# Patient Record
Sex: Male | Born: 1985 | Race: Black or African American | Hispanic: No | Marital: Married | Smoking: Former smoker
Health system: Southern US, Community
[De-identification: ages and names within clinical notes are randomized; demographics above are authoritative.]

## PROBLEM LIST (undated history)

## (undated) DIAGNOSIS — C801 Malignant (primary) neoplasm, unspecified: Secondary | ICD-10-CM

## (undated) DIAGNOSIS — J40 Bronchitis, not specified as acute or chronic: Secondary | ICD-10-CM

## (undated) DIAGNOSIS — J45909 Unspecified asthma, uncomplicated: Secondary | ICD-10-CM

## (undated) DIAGNOSIS — R569 Unspecified convulsions: Secondary | ICD-10-CM

## (undated) DIAGNOSIS — G40909 Epilepsy, unspecified, not intractable, without status epilepticus: Secondary | ICD-10-CM

## (undated) DIAGNOSIS — F1020 Alcohol dependence, uncomplicated: Secondary | ICD-10-CM

## (undated) HISTORY — DX: Alcohol dependence, uncomplicated: F10.20

## (undated) HISTORY — PX: OTHER SURGICAL HISTORY: SHX169

## (undated) HISTORY — DX: Malignant (primary) neoplasm, unspecified: C80.1

---
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
## 2008-05-08 ENCOUNTER — Emergency Department: Payer: Self-pay

## 2008-07-04 ENCOUNTER — Emergency Department: Payer: Self-pay | Admitting: Emergency Medicine

## 2008-07-05 ENCOUNTER — Emergency Department: Payer: Self-pay | Admitting: Internal Medicine

## 2008-12-19 ENCOUNTER — Emergency Department: Payer: Self-pay | Admitting: Emergency Medicine

## 2008-12-21 ENCOUNTER — Emergency Department: Payer: Self-pay | Admitting: Unknown Physician Specialty

## 2009-07-09 ENCOUNTER — Emergency Department: Payer: Self-pay | Admitting: Emergency Medicine

## 2010-08-02 ENCOUNTER — Emergency Department: Payer: Self-pay | Admitting: Emergency Medicine

## 2010-10-09 ENCOUNTER — Emergency Department: Payer: Self-pay | Admitting: Emergency Medicine

## 2011-02-16 ENCOUNTER — Emergency Department: Payer: Self-pay | Admitting: Emergency Medicine

## 2011-02-20 ENCOUNTER — Emergency Department: Payer: Self-pay | Admitting: Emergency Medicine

## 2013-01-16 ENCOUNTER — Emergency Department: Payer: Self-pay | Admitting: Emergency Medicine

## 2013-03-15 ENCOUNTER — Emergency Department: Payer: Self-pay | Admitting: Emergency Medicine

## 2013-07-18 ENCOUNTER — Emergency Department: Payer: Self-pay | Admitting: Internal Medicine

## 2013-07-18 LAB — BASIC METABOLIC PANEL
ANION GAP: 3 — AB (ref 7–16)
BUN: 14 mg/dL (ref 7–18)
CALCIUM: 9.4 mg/dL (ref 8.5–10.1)
CREATININE: 1.15 mg/dL (ref 0.60–1.30)
Chloride: 106 mmol/L (ref 98–107)
Co2: 30 mmol/L (ref 21–32)
GLUCOSE: 72 mg/dL (ref 65–99)
OSMOLALITY: 277 (ref 275–301)
Potassium: 4.1 mmol/L (ref 3.5–5.1)
SODIUM: 139 mmol/L (ref 136–145)

## 2013-07-18 LAB — CBC
HCT: 53.6 % — ABNORMAL HIGH (ref 40.0–52.0)
HGB: 18.1 g/dL — AB (ref 13.0–18.0)
MCH: 30.4 pg (ref 26.0–34.0)
MCHC: 33.8 g/dL (ref 32.0–36.0)
MCV: 90 fL (ref 80–100)
Platelet: 237 10*3/uL (ref 150–440)
RBC: 5.96 10*6/uL — ABNORMAL HIGH (ref 4.40–5.90)
RDW: 14.3 % (ref 11.5–14.5)
WBC: 9.5 10*3/uL (ref 3.8–10.6)

## 2013-07-20 ENCOUNTER — Emergency Department: Payer: Self-pay | Admitting: Emergency Medicine

## 2013-07-20 LAB — CBC
HCT: 54.6 % — AB (ref 40.0–52.0)
HGB: 18.3 g/dL — ABNORMAL HIGH (ref 13.0–18.0)
MCH: 29.9 pg (ref 26.0–34.0)
MCHC: 33.5 g/dL (ref 32.0–36.0)
MCV: 89 fL (ref 80–100)
Platelet: 255 10*3/uL (ref 150–440)
RBC: 6.12 10*6/uL — AB (ref 4.40–5.90)
RDW: 14.1 % (ref 11.5–14.5)
WBC: 8 10*3/uL (ref 3.8–10.6)

## 2013-07-20 LAB — BASIC METABOLIC PANEL
Anion Gap: 4 — ABNORMAL LOW (ref 7–16)
BUN: 13 mg/dL (ref 7–18)
CREATININE: 1.24 mg/dL (ref 0.60–1.30)
Calcium, Total: 9.8 mg/dL (ref 8.5–10.1)
Chloride: 104 mmol/L (ref 98–107)
Co2: 28 mmol/L (ref 21–32)
EGFR (Non-African Amer.): >60
GLUCOSE: 89 mg/dL (ref 65–99)
Osmolality: 272 (ref 275–301)
Potassium: 4 mmol/L (ref 3.5–5.1)
SODIUM: 136 mmol/L (ref 136–145)

## 2013-08-04 DIAGNOSIS — Z720 Tobacco use: Secondary | ICD-10-CM | POA: Diagnosis present

## 2013-10-26 ENCOUNTER — Emergency Department: Payer: Self-pay | Admitting: Emergency Medicine

## 2013-10-26 LAB — CBC WITH DIFFERENTIAL/PLATELET
BASOS ABS: 0.1 10*3/uL (ref 0.0–0.1)
BASOS PCT: 0.5 %
Eosinophil #: 0.2 10*3/uL (ref 0.0–0.7)
Eosinophil %: 2.1 %
HCT: 50 % (ref 40.0–52.0)
HGB: 16.3 g/dL (ref 13.0–18.0)
LYMPHS PCT: 11.7 %
Lymphocyte #: 1.3 10*3/uL (ref 1.0–3.6)
MCH: 29.4 pg (ref 26.0–34.0)
MCHC: 32.6 g/dL (ref 32.0–36.0)
MCV: 90 fL (ref 80–100)
Monocyte #: 0.9 x10 3/mm (ref 0.2–1.0)
Monocyte %: 8.3 %
NEUTROS ABS: 8.5 10*3/uL — AB (ref 1.4–6.5)
Neutrophil %: 77.4 %
PLATELETS: 201 10*3/uL (ref 150–440)
RBC: 5.56 10*6/uL (ref 4.40–5.90)
RDW: 14.5 % (ref 11.5–14.5)
WBC: 11 10*3/uL — ABNORMAL HIGH (ref 3.8–10.6)

## 2013-10-26 LAB — COMPREHENSIVE METABOLIC PANEL
ALBUMIN: 4 g/dL (ref 3.4–5.0)
AST: 25 U/L (ref 15–37)
Alkaline Phosphatase: 75 U/L
Anion Gap: 4 — ABNORMAL LOW (ref 7–16)
BUN: 16 mg/dL (ref 7–18)
Bilirubin,Total: 0.4 mg/dL (ref 0.2–1.0)
CHLORIDE: 109 mmol/L — AB (ref 98–107)
Calcium, Total: 9.1 mg/dL (ref 8.5–10.1)
Co2: 27 mmol/L (ref 21–32)
Creatinine: 1.29 mg/dL (ref 0.60–1.30)
EGFR (African American): >60
Glucose: 95 mg/dL (ref 65–99)
Osmolality: 280 (ref 275–301)
Potassium: 4.1 mmol/L (ref 3.5–5.1)
SGPT (ALT): 27 U/L (ref 12–78)
Sodium: 140 mmol/L (ref 136–145)
TOTAL PROTEIN: 7.2 g/dL (ref 6.4–8.2)

## 2013-10-26 LAB — PHENYTOIN LEVEL, TOTAL: Dilantin: <0.4 ug/mL — ABNORMAL LOW (ref 10.0–20.0)

## 2014-01-23 ENCOUNTER — Emergency Department: Payer: Self-pay | Admitting: Student

## 2014-01-23 LAB — COMPREHENSIVE METABOLIC PANEL
ALK PHOS: 59 U/L
ALT: 26 U/L
ANION GAP: 7 (ref 7–16)
Albumin: 4.3 g/dL (ref 3.4–5.0)
BUN: 9 mg/dL (ref 7–18)
Bilirubin,Total: 0.3 mg/dL (ref 0.2–1.0)
CO2: 23 mmol/L (ref 21–32)
CREATININE: 1.11 mg/dL (ref 0.60–1.30)
Calcium, Total: 9.3 mg/dL (ref 8.5–10.1)
Chloride: 113 mmol/L — ABNORMAL HIGH (ref 98–107)
EGFR (African American): >60
Glucose: 80 mg/dL (ref 65–99)
Osmolality: 283 (ref 275–301)
POTASSIUM: 4 mmol/L (ref 3.5–5.1)
SGOT(AST): 16 U/L (ref 15–37)
Sodium: 143 mmol/L (ref 136–145)
TOTAL PROTEIN: 7.4 g/dL (ref 6.4–8.2)

## 2014-01-23 LAB — CBC
HCT: 51.3 % (ref 40.0–52.0)
HGB: 16.3 g/dL (ref 13.0–18.0)
MCH: 29.2 pg (ref 26.0–34.0)
MCHC: 31.8 g/dL — ABNORMAL LOW (ref 32.0–36.0)
MCV: 92 fL (ref 80–100)
Platelet: 252 10*3/uL (ref 150–440)
RBC: 5.6 10*6/uL (ref 4.40–5.90)
RDW: 15.2 % — AB (ref 11.5–14.5)
WBC: 8.1 10*3/uL (ref 3.8–10.6)

## 2014-01-23 LAB — CK: CK, Total: 298 U/L

## 2014-03-08 ENCOUNTER — Emergency Department: Payer: Self-pay | Admitting: Emergency Medicine

## 2014-10-03 ENCOUNTER — Emergency Department
Admit: 2014-10-03 | Disposition: A | Discharge status: Home / Self Care | Payer: Self-pay | Admitting: Emergency Medicine

## 2014-10-25 ENCOUNTER — Emergency Department
Admission: EM | Admit: 2014-10-25 | Discharge: 2014-10-25 | Disposition: A | Discharge status: Home / Self Care | Payer: Self-pay | Attending: Emergency Medicine | Admitting: Emergency Medicine

## 2014-10-25 DIAGNOSIS — R569 Unspecified convulsions: Secondary | ICD-10-CM

## 2014-10-25 DIAGNOSIS — G40909 Epilepsy, unspecified, not intractable, without status epilepticus: Secondary | ICD-10-CM | POA: Insufficient documentation

## 2014-10-25 DIAGNOSIS — Z880 Allergy status to penicillin: Secondary | ICD-10-CM | POA: Insufficient documentation

## 2014-10-25 MED ORDER — LEVETIRACETAM 500 MG PO TABS
1000.0000 mg | ORAL_TABLET | Freq: Once | ORAL | Status: AC
  Administered 2014-10-25: 1000 mg via ORAL

## 2014-10-25 MED ORDER — LEVETIRACETAM 500 MG PO TABS: 1000.0000 mg | ORAL_TABLET | Freq: Two times a day (BID) | ORAL | Status: DC

## 2014-10-25 MED ORDER — LEVETIRACETAM 500 MG PO TABS
ORAL_TABLET | ORAL | Status: AC
Start: 2014-10-25 — End: 2014-10-25
  Administered 2014-10-25: 1000 mg via ORAL
  Filled 2014-10-25: qty 2

## 2014-10-25 NOTE — ED Notes (Signed)
Has a hx of epilepsy had 1 seizure this am states feels weak and dizzy, also co nausea.  Pt is on keppra pt ran out yesterday.

## 2014-10-25 NOTE — Discharge Instructions (Signed)
Please seek medical attention for any high fevers, chest pain, shortness of breath, change in behavior, persistent vomiting, bloody stool or any other new or concerning symptoms. It is very important that you do not drive, go up on roofs, swim, or put herself in any situation that might be dangerous for your others if you're to have another seizure until you are cleared by a neurologist.  Epilepsy Epilepsy is a disorder in which a person has repeated seizures over time. A seizure is a release of abnormal electrical activity in the brain. Seizures can cause a change in attention, behavior, or the ability to remain awake and alert (altered mental status). Seizures often involve uncontrollable shaking (convulsions).  Most people with epilepsy lead normal lives. However, people with epilepsy are at an increased risk of falls, accidents, and injuries. Therefore, it is important to begin treatment right away. CAUSES  Epilepsy has many possible causes. Anything that disturbs the normal pattern of brain cell activity can lead to seizures. This may include:   Head injury.  Birth trauma.  High fever as a child.  Stroke.  Bleeding into or around the brain.  Certain drugs.  Prolonged low oxygen, such as what occurs after CPR efforts.  Abnormal brain development.  Certain illnesses, such as meningitis, encephalitis (brain infection), malaria, and other infections.  An imbalance of nerve signaling chemicals (neurotransmitters).  SIGNS AND SYMPTOMS  The symptoms of a seizure can vary greatly from one person to another. Right before a seizure, you may have a warning (aura) that a seizure is about to occur. An aura may include the following symptoms:  Fear or anxiety.  Nausea.  Feeling like the room is spinning (vertigo).  Vision changes, such as seeing flashing lights or spots. Common symptoms during a seizure include:  Abnormal sensations, such as an abnormal smell or a bitter taste in the  mouth.   Sudden, general body stiffness.   Convulsions that involve rhythmic jerking of the face, arm, or leg on one or both sides.   Sudden change in consciousness.   Appearing to be awake but not responding.   Appearing to be asleep but cannot be awakened.   Grimacing, chewing, lip smacking, drooling, tongue biting, or loss of bowel or bladder control. After a seizure, you may feel sleepy for a while. DIAGNOSIS  Your health care provider will ask about your symptoms and take a medical history. Descriptions from any witnesses to your seizures will be very helpful in the diagnosis. A physical exam, including a detailed neurological exam, is necessary. Various tests may be done, such as:   An electroencephalogram (EEG). This is a painless test of your brain waves. In this test, a diagram is created of your brain waves. These diagrams can be interpreted by a specialist.  An MRI of the brain.   A CT scan of the brain.   A spinal tap (lumbar puncture, LP).  Blood tests to check for signs of infection or abnormal blood chemistry. TREATMENT  There is no cure for epilepsy, but it is generally treatable. Once epilepsy is diagnosed, it is important to begin treatment as soon as possible. For most people with epilepsy, seizures can be controlled with medicines. The following may also be used:  A pacemaker for the brain (vagus nerve stimulator) can be used for people with seizures that are not well controlled by medicine.  Surgery on the brain. For some people, epilepsy eventually goes away. HOME CARE INSTRUCTIONS   Follow your health  care provider's recommendations on driving and safety in normal activities.  Get enough rest. Lack of sleep can cause seizures.  Only take over-the-counter or prescription medicines as directed by your health care provider. Take any prescribed medicine exactly as directed.  Avoid any known triggers of your seizures.  Keep a seizure diary. Record  what you recall about any seizure, especially any possible trigger.   Make sure the people you live and work with know that you are prone to seizures. They should receive instructions on how to help you. In general, a witness to a seizure should:   Cushion your head and body.   Turn you on your side.   Avoid unnecessarily restraining you.   Not place anything inside your mouth.   Call for emergency medical help if there is any question about what has occurred.   Follow up with your health care provider as directed. You may need regular blood tests to monitor the levels of your medicine.  SEEK MEDICAL CARE IF:   You develop signs of infection or other illness. This might increase the risk of a seizure.   You seem to be having more frequent seizures.   Your seizure pattern is changing.  SEEK IMMEDIATE MEDICAL CARE IF:   You have a seizure that does not stop after a few moments.   You have a seizure that causes any difficulty in breathing.   You have a seizure that results in a very severe headache.   You have a seizure that leaves you with the inability to speak or use a part of your body.  Document Released: 05/27/2005 Document Revised: 03/17/2013 Document Reviewed: 01/06/2013 [facility] Patient Information 2015 [place], [place]. This information is not intended to replace advice given to you by your health care provider. Make sure you discuss any questions you have with your health care provider.

## 2014-10-25 NOTE — ED Provider Notes (Signed)
[facility] Emergency Department Provider Note   ____________________________________________  Time seen: 2110  I have reviewed the triage vital signs and the nursing notes.   HISTORY  Chief Complaint Seizures   History limited by: Not Limited   HPI Shane Leach is a 29 y.o. male history of epilepsy presents to the emergency department today after a seizure. He shouldn't states that he ran out of his Keppra yesterday. He states that he is having seizures roughly once or twice a month. States after today's seizure he felt cramps and soreness throughout his body. He states this is normally how he feels after seizures. He denies any recent heavy alcohol use or sleep deprivation.    No past medical history on file.  There are no active problems to display for this patient.   No past surgical history on file.  Current Outpatient Rx  Name  Route  Sig  Dispense  Refill  . levETIRAcetam (KEPPRA) 500 MG tablet   Oral   Take 2 tablets (1,000 mg total) by mouth 2 (two) times daily.   60 tablet   0     Allergies Dilantin; Penicillins; and Tegretol  No family history on file.  Social History History  Substance Use Topics  . Smoking status: Not on file  . Smokeless tobacco: Not on file  . Alcohol Use: Not on file    Review of Systems  Constitutional: Negative for fever. Cardiovascular: Negative for chest pain. Respiratory: Negative for shortness of breath. Gastrointestinal: Negative for abdominal pain, vomiting and diarrhea. Genitourinary: Negative for dysuria. Musculoskeletal: Negative for back pain. Skin: Negative for rash. Neurological: positive for seizure   10-point ROS otherwise negative.  ____________________________________________   PHYSICAL EXAM:  VITAL SIGNS: ED Triage Vitals  Enc Vitals Group     BP 10/25/14 1943 125/76 mmHg     Pulse Rate 10/25/14 1943 74     Resp 10/25/14 1943 18     Temp 10/25/14 1943 98.8  F (37.1 C)     Temp Source 10/25/14 1943 Oral     SpO2 10/25/14 1943 100 %     Weight 10/25/14 1943 210 lb (95.255 kg)     Height 10/25/14 1943 6' (1.829 m)     Head Cir --      Peak Flow --      Pain Score 10/25/14 1943 10   Constitutional: Alert and oriented. Well appearing and in no distress. Eyes: Conjunctivae are normal. PERRL. Normal extraocular movements. ENT   Head: Normocephalic and atraumatic.   Nose: No congestion/rhinnorhea.   Mouth/Throat: Mucous membranes are moist.   Neck: No stridor. Hematological/Lymphatic/Immunilogical: No cervical lymphadenopathy. Cardiovascular: Normal rate, regular rhythm.  No murmurs, rubs, or gallops. Respiratory: Normal respiratory effort without tachypnea nor retractions. Breath sounds are clear and equal bilaterally. No wheezes/rales/rhonchi. Genitourinary: Deferred Musculoskeletal: Normal range of motion in all extremities. No joint effusions.  No lower extremity tenderness nor edema. Neurologic:  Normal speech and language. No gross focal neurologic deficits are appreciated. Speech is normal.  Skin:  Skin is warm, dry and intact. No rash noted. Psychiatric: Mood and affect are normal. Speech and behavior are normal. Patient exhibits appropriate insight and judgment.  ____________________________________________    LABS (pertinent positives/negatives)  None  ____________________________________________   EKG  None  ____________________________________________    RADIOLOGY  None  ____________________________________________   PROCEDURES  Procedure(s) performed: None  Critical Care performed: No  ____________________________________________   INITIAL IMPRESSION / ASSESSMENT AND PLAN / ED  COURSE  Pertinent labs & imaging results that were available during my care of the patient were reviewed by me and considered in my medical decision making (see chart for details).  Patient with a history of  epilepsy here after a seizure. Patient states he ran out of his medication yesterday. He is on Keppra. No concerning signs on physical exam. Patient states he feels like he normally does after a seizure. Plan to give patient prescription for Keppra. We will encourage neurology follow-up. He is followed at [facility]. Per care everywhere patient is on 500 mg Keppra 2 tabs orally twice a day ____________________________________________   FINAL CLINICAL IMPRESSION(S) / ED DIAGNOSES  Final diagnoses:  Seizure     Nance Pear, MD 10/25/14 2121

## 2014-12-31 ENCOUNTER — Other Ambulatory Visit: Payer: Self-pay

## 2014-12-31 ENCOUNTER — Emergency Department: Payer: Self-pay

## 2014-12-31 ENCOUNTER — Encounter: Payer: Self-pay | Admitting: Emergency Medicine

## 2014-12-31 ENCOUNTER — Emergency Department
Admission: EM | Admit: 2014-12-31 | Discharge: 2015-01-01 | Disposition: A | Discharge status: Home / Self Care | Payer: Self-pay | Attending: Emergency Medicine | Admitting: Emergency Medicine

## 2014-12-31 DIAGNOSIS — Z720 Tobacco use: Secondary | ICD-10-CM | POA: Insufficient documentation

## 2014-12-31 DIAGNOSIS — J45901 Unspecified asthma with (acute) exacerbation: Secondary | ICD-10-CM | POA: Insufficient documentation

## 2014-12-31 DIAGNOSIS — Z880 Allergy status to penicillin: Secondary | ICD-10-CM | POA: Insufficient documentation

## 2014-12-31 DIAGNOSIS — F419 Anxiety disorder, unspecified: Secondary | ICD-10-CM | POA: Insufficient documentation

## 2014-12-31 DIAGNOSIS — Z79899 Other long term (current) drug therapy: Secondary | ICD-10-CM | POA: Insufficient documentation

## 2014-12-31 HISTORY — DX: Unspecified convulsions: R56.9

## 2014-12-31 HISTORY — DX: Unspecified asthma, uncomplicated: J45.909

## 2014-12-31 HISTORY — DX: Bronchitis, not specified as acute or chronic: J40

## 2014-12-31 LAB — COMPREHENSIVE METABOLIC PANEL
ALBUMIN: 4.8 g/dL (ref 3.5–5.0)
ALT: 22 U/L (ref 17–63)
AST: 23 U/L (ref 15–41)
Alkaline Phosphatase: 67 U/L (ref 38–126)
Anion gap: 12 (ref 5–15)
BILIRUBIN TOTAL: 0.8 mg/dL (ref 0.3–1.2)
BUN: 12 mg/dL (ref 6–20)
CALCIUM: 9.7 mg/dL (ref 8.9–10.3)
CHLORIDE: 109 mmol/L (ref 101–111)
CO2: 19 mmol/L — ABNORMAL LOW (ref 22–32)
Creatinine, Ser: 1.14 mg/dL (ref 0.61–1.24)
GFR calc Af Amer: >60 mL/min (ref >60)
GFR calc non Af Amer: >60 mL/min (ref >60)
Glucose, Bld: 77 mg/dL (ref 65–99)
Potassium: 4 mmol/L (ref 3.5–5.1)
Sodium: 140 mmol/L (ref 135–145)
TOTAL PROTEIN: 7.9 g/dL (ref 6.5–8.1)

## 2014-12-31 LAB — CBC
HCT: 50.9 % (ref 40.0–52.0)
Hemoglobin: 17.2 g/dL (ref 13.0–18.0)
MCH: 29.9 pg (ref 26.0–34.0)
MCHC: 33.7 g/dL (ref 32.0–36.0)
MCV: 88.5 fL (ref 80.0–100.0)
PLATELETS: 197 10*3/uL (ref 150–440)
RBC: 5.75 MIL/uL (ref 4.40–5.90)
RDW: 14.9 % — AB (ref 11.5–14.5)
WBC: 12.4 10*3/uL — AB (ref 3.8–10.6)

## 2014-12-31 MED ORDER — ALBUTEROL SULFATE (2.5 MG/3ML) 0.083% IN NEBU
2.5000 mg | INHALATION_SOLUTION | Freq: Once | RESPIRATORY_TRACT | Status: AC
  Administered 2014-12-31: 2.5 mg via RESPIRATORY_TRACT

## 2014-12-31 MED ORDER — ALBUTEROL SULFATE (2.5 MG/3ML) 0.083% IN NEBU
INHALATION_SOLUTION | RESPIRATORY_TRACT | Status: AC
  Filled 2014-12-31: qty 3

## 2014-12-31 MED ORDER — IPRATROPIUM-ALBUTEROL 0.5-2.5 (3) MG/3ML IN SOLN
3.0000 mL | Freq: Once | RESPIRATORY_TRACT | Status: AC
  Administered 2014-12-31: 3 mL via RESPIRATORY_TRACT

## 2014-12-31 MED ORDER — ALBUTEROL SULFATE (2.5 MG/3ML) 0.083% IN NEBU
INHALATION_SOLUTION | RESPIRATORY_TRACT | Status: AC
  Filled 2014-12-31: qty 6

## 2014-12-31 MED ORDER — IPRATROPIUM-ALBUTEROL 0.5-2.5 (3) MG/3ML IN SOLN
RESPIRATORY_TRACT | Status: AC
Start: 2014-12-31 — End: 2014-12-31
  Administered 2014-12-31: 3 mL
  Filled 2014-12-31: qty 3

## 2014-12-31 MED ORDER — METHYLPREDNISOLONE SODIUM SUCC 125 MG IJ SOLR
INTRAMUSCULAR | Status: AC
  Administered 2014-12-31: 125 mg via INTRAVENOUS
  Filled 2014-12-31: qty 2

## 2014-12-31 MED ORDER — METHYLPREDNISOLONE SODIUM SUCC 125 MG IJ SOLR
125.0000 mg | Freq: Once | INTRAMUSCULAR | Status: AC
  Administered 2014-12-31: 125 mg via INTRAVENOUS

## 2014-12-31 NOTE — ED Provider Notes (Signed)
[facility] Emergency Department Provider Note  ____________________________________________  Time seen: On arrival  I have reviewed the triage vital signs and the nursing notes.   HISTORY  Chief Complaint Shortness of Breath and Hyperventilating  History of present illness limited by patient distress  HPI Shane Leach is a 29 y.o. male who presents with shortness of breath. He reports a history of asthma in the past.He admits smoking. He denies cough or fever.     No past medical history on file.  There are no active problems to display for this patient.   No past surgical history on file.  Current Outpatient Rx  Name  Route  Sig  Dispense  Refill  . levETIRAcetam (KEPPRA) 500 MG tablet   Oral   Take 2 tablets (1,000 mg total) by mouth 2 (two) times daily.   60 tablet   0     Allergies Dilantin; Penicillins; and Tegretol  No family history on file.  Social History History  Substance Use Topics  . Smoking status: Not on file  . Smokeless tobacco: Not on file  . Alcohol Use: Not on file   positive smoker, no recent alcohol  Review of Systems  Constitutional: Negative for fever. Eyes: Negative for visual changes. ENT: Negative for sore throat Cardiovascular: Negative for chest pain. Respiratory: Positive for shortness of breath Gastrointestinal: Negative for abdominal pain, vomiting and diarrhea. Genitourinary: Negative for dysuria. Musculoskeletal: Negative for back pain. Skin: Negative for rash. Neurological: Negative for headaches or focal weakness Psychiatric: Positive for anxiety  10-point ROS otherwise negative.  ____________________________________________   PHYSICAL EXAM:  VITAL SIGNS: ED Triage Vitals  Enc Vitals Group     BP 12/31/14 2222 122/88 mmHg     Pulse Rate 12/31/14 2222 109     Resp 12/31/14 2222 40     Temp 12/31/14 2222 97.8 F (36.6 C)     Temp Source 12/31/14 2222 Oral     SpO2 12/31/14  2222 94 %     Weight 12/31/14 2222 200 lb (90.719 kg)     Height 12/31/14 2222 6' (1.829 m)     Head Cir --      Peak Flow --      Pain Score --      Pain Loc --      Pain Edu? --      Excl. in Pink? --      Constitutional: Alert and oriented. Patient diaphoretic and ill-appearing Eyes: Conjunctivae are normal.  ENT   Head: Normocephalic and atraumatic.   Mouth/Throat: Mucous membranes are moist. Cardiovascular: Tachycardia regular rhythm. Normal and symmetric distal pulses are present in all extremities. No murmurs, rubs, or gallops. Respiratory: Positive tachypnea and accessory muscle use. Wheezes diffusely Gastrointestinal: Soft and non-tender in all quadrants. No distention. There is no CVA tenderness. Genitourinary: deferred Musculoskeletal: Nontender with normal range of motion in all extremities. No lower extremity tenderness nor edema. Neurologic:  Normal speech and language. No gross focal neurologic deficits are appreciated. Skin:  Skin is diaphoretic Psychiatric: Mood and affect are normal. Patient exhibits appropriate insight and judgment.  ____________________________________________    LABS (pertinent positives/negatives)  Labs Reviewed  CBC  COMPREHENSIVE METABOLIC PANEL    ____________________________________________   EKG  ED ECG REPORT I, Lavonia Drafts, the attending physician, personally viewed and interpreted this ECG.   Date: 12/31/2014  EKG Time: 10:47 PM  Rate: 95  Rhythm: normal sinus rhythm  Axis: Left Axis deviation  Intervals:Incomplete right bundle  branch block  ST&T Change: Nonspecific   ____________________________________________    RADIOLOGY I have personally reviewed any xrays that were ordered on this patient: pending  ____________________________________________   PROCEDURES  Procedure(s) performed: none  Critical Care performed: none  ____________________________________________   INITIAL IMPRESSION /  ASSESSMENT AND PLAN / ED COURSE  Pertinent labs & imaging results that were available during my care of the patient were reviewed by me and considered in my medical decision making (see chart for details).  Patient seen immediately. DuoNeb and IV slight Medrol ordered. We will check chest x-ray labs and reevaluate closely  ----------------------------------------- 11:08 PM on 12/31/2014 -----------------------------------------  Patient's heart rate is improving, tachypnea is improving. I will sign out to Dr. Kerman Passey who will reevaluate the patient ____________________________________________   FINAL CLINICAL IMPRESSION(S) / ED DIAGNOSES  Asthma exacerbation, acute   Lavonia Drafts, MD 12/31/14 2309

## 2014-12-31 NOTE — ED Notes (Signed)
Pt c/o shortness of breath since Friday morning; presents tonight with tachypnea, diaphoresis, unable to speak in complete sentences, audible wheezing, pale skin; pt admits to smoking 1/2 pack per day with history of asthma; does not have a rescue inhaler

## 2015-01-01 LAB — TROPONIN I: Troponin I: <0.03 ng/mL (ref <0.031)

## 2015-01-01 MED ORDER — ALBUTEROL SULFATE HFA 108 (90 BASE) MCG/ACT IN AERS: 2.0000 | INHALATION_SPRAY | RESPIRATORY_TRACT | Status: DC | PRN

## 2015-01-01 MED ORDER — PREDNISONE 20 MG PO TABS: 40.0000 mg | ORAL_TABLET | Freq: Every day | ORAL | Status: DC

## 2015-01-01 MED ORDER — IPRATROPIUM-ALBUTEROL 0.5-2.5 (3) MG/3ML IN SOLN
3.0000 mL | Freq: Once | RESPIRATORY_TRACT | Status: AC
  Administered 2015-01-01: 3 mL via RESPIRATORY_TRACT
  Filled 2015-01-01: qty 3

## 2015-01-01 MED ORDER — AZITHROMYCIN 250 MG PO TABS: ORAL_TABLET | ORAL | Status: AC

## 2015-01-01 NOTE — ED Provider Notes (Signed)
-----------------------------------------   12:20 AM on 01/01/2015 -----------------------------------------  Patient appears to be moving a good amount of air, but has diffuse wheeze bilaterally still. Patient has received 1 DuoNeb, and 2 albuterol nebulizers. We will continue with 2 more DuoNeb nebulizers. Patient received 125 mg of Solu-Medrol. Chest x-ray was clear, labs are largely within normal limits. We will add on a troponin. Patient denies any chest pain or drug use. He has been coughing, states a history of asthma, states he is supposed be on medications which he does not take. Patient with a likely asthma exacerbation, possibly exacerbated by acute bronchitis. After nebulizer treatments we will take the patient off oxygen and monitor his saturation status.  ----------------------------------------- 1:29 AM on 01/01/2015 -----------------------------------------  Patient sounds much better at this time. Minimal wheeze on exam. We will place the patient on steroid's, albuterol, and Z-Pak to cover for any possible bronchitis going home. Patient to follow-up with a primary care physician this week. I discussed very strict return precautions for any further trouble breathing, the patient is agreeable to return if he has any trouble breathing.  Harvest Dark, MD 01/01/15 [phone]

## 2015-01-01 NOTE — Discharge Instructions (Signed)

## 2015-03-14 ENCOUNTER — Emergency Department: Payer: Self-pay

## 2015-03-14 ENCOUNTER — Encounter: Payer: Self-pay | Admitting: Emergency Medicine

## 2015-03-14 ENCOUNTER — Emergency Department
Admission: EM | Admit: 2015-03-14 | Discharge: 2015-03-14 | Disposition: A | Discharge status: Home / Self Care | Payer: Self-pay | Attending: Student | Admitting: Student

## 2015-03-14 DIAGNOSIS — Z87891 Personal history of nicotine dependence: Secondary | ICD-10-CM | POA: Insufficient documentation

## 2015-03-14 DIAGNOSIS — Z880 Allergy status to penicillin: Secondary | ICD-10-CM | POA: Insufficient documentation

## 2015-03-14 DIAGNOSIS — J4531 Mild persistent asthma with (acute) exacerbation: Secondary | ICD-10-CM | POA: Insufficient documentation

## 2015-03-14 DIAGNOSIS — Z79899 Other long term (current) drug therapy: Secondary | ICD-10-CM | POA: Insufficient documentation

## 2015-03-14 HISTORY — DX: Epilepsy, unspecified, not intractable, without status epilepticus: G40.909

## 2015-03-14 MED ORDER — PREDNISONE 10 MG PO TABS: ORAL_TABLET | ORAL | Status: DC

## 2015-03-14 MED ORDER — AZITHROMYCIN 250 MG PO TABS: ORAL_TABLET | ORAL | Status: AC

## 2015-03-14 MED ORDER — IPRATROPIUM-ALBUTEROL 0.5-2.5 (3) MG/3ML IN SOLN
3.0000 mL | Freq: Once | RESPIRATORY_TRACT | Status: AC
  Administered 2015-03-14: 3 mL via RESPIRATORY_TRACT
  Filled 2015-03-14: qty 3

## 2015-03-14 MED ORDER — PREDNISONE 20 MG PO TABS
60.0000 mg | ORAL_TABLET | Freq: Once | ORAL | Status: AC
  Administered 2015-03-14: 60 mg via ORAL
  Filled 2015-03-14: qty 3

## 2015-03-14 MED ORDER — ALBUTEROL SULFATE HFA 108 (90 BASE) MCG/ACT IN AERS: 2.0000 | INHALATION_SPRAY | Freq: Four times a day (QID) | RESPIRATORY_TRACT | Status: DC | PRN

## 2015-03-14 NOTE — ED Notes (Signed)
Patient presents to the ED with increased cough and congestion x 2 days.  Patient reports a history of asthma and bronchitis and states he often has exacerbations of his bronchitis.  Patient is in no obvious distress, ambulatory to triage, denies fever.  Patient reports body aches x 2 days as well.  Wheezing auscultated bilaterally.

## 2015-03-14 NOTE — ED Provider Notes (Signed)
CSN: [id number]     Arrival date & time 03/14/15  1604 History   First MD Initiated Contact with Patient 03/14/15 1659     Chief Complaint  Patient presents with  . Cough  . Nasal Congestion     HPI Comments: 29 year old male presents today complaining of productive cough and difficulty breathing for the past 3 days. He has a history of asthmatic bronchitis. He is a former smoker. He has been out of his albuterol inhaler. He has had some subjective fevers. He has not taken any over-the-counter cold medications.  Patient is a 29 y.o. male presenting with cough.  Cough Associated symptoms: fever and shortness of breath   Associated symptoms: no chills, no myalgias and no rash     Past Medical History  Diagnosis Date  . Seizures ([place])   . Asthma   . Bronchitis   . Epilepsy [facility])    Past Surgical History  Procedure Laterality Date  . Testicular cancer      right testicle removed   No family history on file. Social History  Substance Use Topics  . Smoking status: Former Smoker -- 0.50 packs/day    Types: Cigarettes    Quit date: 01/12/2015  . Smokeless tobacco: Never Used  . Alcohol Use: No    Review of Systems  Constitutional: Positive for fever. Negative for chills.  Respiratory: Positive for cough and shortness of breath.   Musculoskeletal: Negative for myalgias and arthralgias.  Skin: Negative for rash.  All other systems reviewed and are negative.     Allergies  Dilantin; Penicillins; and Tegretol  Home Medications   Prior to Admission medications   Medication Sig Start Date End Date Taking? Authorizing Provider  levETIRAcetam (KEPPRA) 500 MG tablet Take 2 tablets (1,000 mg total) by mouth 2 (two) times daily. 10/25/14  Yes Nance Pear, MD  albuterol (PROVENTIL HFA;VENTOLIN HFA) 108 (90 BASE) MCG/ACT inhaler Inhale 2 puffs into the lungs every 6 (six) hours as needed for wheezing or shortness of breath. 03/14/15   Corliss Parish, PA-C  azithromycin  (ZITHROMAX Z-PAK) 250 MG tablet Take 2 tablets (500 mg) on  Day 1,  followed by 1 tablet (250 mg) once daily on Days 2 through 5. 03/14/15 03/19/15  Shayne Alken V, PA-C  predniSONE (DELTASONE) 10 MG tablet Taper: 6,5,4,3,2,1 03/14/15   Shayne Alken V, PA-C   BP 125/87 mmHg  Pulse 79  Temp(Src) 99.1 F (37.3 C) (Oral)  Resp 20  Ht 6' (1.829 m)  Wt 210 lb (95.255 kg)  BMI 28.47 kg/m2  SpO2 95% Physical Exam  Constitutional: He is oriented to person, place, and time. Vital signs are normal. He appears well-developed and well-nourished. He is active.  Non-toxic appearance. He does not have a sickly appearance. He does not appear ill.  HENT:  Head: Normocephalic and atraumatic.  Right Ear: Tympanic membrane and external ear normal.  Left Ear: Tympanic membrane and external ear normal.  Nose: Nose normal.  Mouth/Throat: Oropharynx is clear and moist.  Eyes: Conjunctivae and EOM are normal. Pupils are equal, round, and reactive to light.  Neck: Normal range of motion. Neck supple.  Cardiovascular: Normal rate, regular rhythm, normal heart sounds and intact distal pulses.  Exam reveals no gallop and no friction rub.   No murmur heard. Pulmonary/Chest: Effort normal. No respiratory distress. He has wheezes. He has rhonchi. He has no rales.  Musculoskeletal: Normal range of motion.  Lymphadenopathy:    He has no cervical adenopathy.  Neurological: He is alert and oriented to person, place, and time.  Skin: Skin is warm and dry. No rash noted.  Psychiatric: He has a normal mood and affect. His behavior is normal. Judgment and thought content normal.  Nursing note and vitals reviewed.   ED Course  Procedures (including critical care time) Labs Review Labs Reviewed - No data to display  Imaging Review Dg Chest 2 View  03/14/2015   CLINICAL DATA:  Patient presents to the ED with increased cough and congestion x 2 days. Patient reports a history of asthma and bronchitis and states he often has  exacerbations of his bronchitis.  EXAM: CHEST  2 VIEW  COMPARISON:  12/31/2014  FINDINGS: Lungs are hyperinflated. No focal consolidations or pleural effusions. No pulmonary edema.  IMPRESSION: Hyperinflation.   Electronically Signed   By: Nolon Nations M.D.   On: 03/14/2015 17:15   I have personally reviewed and evaluated these images and lab results as part of my medical decision-making.   EKG Interpretation None      MDM  Duoneb adminstered in ER. Prednisone 60mg  orally RX for albuterol inhaler and 6 day prednisone burst Zpak to cover for bacterial infection given fevers Return for new or worsening symptoms.  Final diagnoses:  Asthmatic bronchitis, mild persistent, with acute exacerbation       Corliss Parish, PA-C 03/14/15 1747  Joanne Gavel, MD 03/14/15 2032

## 2015-03-14 NOTE — Discharge Instructions (Signed)
Asthma, Adult Asthma is a recurring condition in which the airways tighten and narrow. Asthma can make it difficult to breathe. It can cause coughing, wheezing, and shortness of breath. Asthma episodes, also called asthma attacks, range from minor to life-threatening. Asthma cannot be cured, but medicines and lifestyle changes can help control it. CAUSES Asthma is believed to be caused by inherited (genetic) and environmental factors, but its exact cause is unknown. Asthma may be triggered by allergens, lung infections, or irritants in the air. Asthma triggers are different for each person. Common triggers include:   Animal dander.  Dust mites.  Cockroaches.  Pollen from trees or grass.  Mold.  Smoke.  Air pollutants such as dust, household cleaners, hair sprays, aerosol sprays, paint fumes, strong chemicals, or strong odors.  Cold air, weather changes, and winds (which increase molds and pollens in the air).  Strong emotional expressions such as crying or laughing hard.  Stress.  Certain medicines (such as aspirin) or types of drugs (such as beta-blockers).  Sulfites in foods and drinks. Foods and drinks that may contain sulfites include dried fruit, potato chips, and sparkling grape juice.  Infections or inflammatory conditions such as the flu, a cold, or an inflammation of the nasal membranes (rhinitis).  Gastroesophageal reflux disease (GERD).  Exercise or strenuous activity. SYMPTOMS Symptoms may occur immediately after asthma is triggered or many hours later. Symptoms include:  Wheezing.  Excessive nighttime or early morning coughing.  Frequent or severe coughing with a common cold.  Chest tightness.  Shortness of breath. DIAGNOSIS  The diagnosis of asthma is made by a review of your medical history and a physical exam. Tests may also be performed. These may include:  Lung function studies. These tests show how much air you breathe in and out.  Allergy  tests.  Imaging tests such as X-rays. TREATMENT  Asthma cannot be cured, but it can usually be controlled. Treatment involves identifying and avoiding your asthma triggers. It also involves medicines. There are 2 classes of medicine used for asthma treatment:   Controller medicines. These prevent asthma symptoms from occurring. They are usually taken every day.  Reliever or rescue medicines. These quickly relieve asthma symptoms. They are used as needed and provide short-term relief. Your health care provider will help you create an asthma action plan. An asthma action plan is a written plan for managing and treating your asthma attacks. It includes a list of your asthma triggers and how they may be avoided. It also includes information on when medicines should be taken and when their dosage should be changed. An action plan may also involve the use of a device called a peak flow meter. A peak flow meter measures how well the lungs are working. It helps you monitor your condition. HOME CARE INSTRUCTIONS   Take medicines only as directed by your health care provider. Speak with your health care provider if you have questions about how or when to take the medicines.  Use a peak flow meter as directed by your health care provider. Record and keep track of readings.  Understand and use the action plan to help minimize or stop an asthma attack without needing to seek medical care.  Control your home environment in the following ways to help prevent asthma attacks:  Do not smoke. Avoid being exposed to secondhand smoke.  Change your heating and air conditioning filter regularly.  Limit your use of fireplaces and wood stoves.  Get rid of pests (such as roaches   and mice) and their droppings.  Throw away plants if you see mold on them.  Clean your floors and dust regularly. Use unscented cleaning products.  Try to have someone else vacuum for you regularly. Stay out of rooms while they are  being vacuumed and for a short while afterward. If you vacuum, use a dust mask from a hardware store, a double-layered or microfilter vacuum cleaner bag, or a vacuum cleaner with a HEPA filter.  Replace carpet with wood, tile, or vinyl flooring. Carpet can trap dander and dust.  Use allergy-proof pillows, mattress covers, and box spring covers.  Wash bed sheets and blankets every week in hot water and dry them in a dryer.  Use blankets that are made of polyester or cotton.  Clean bathrooms and kitchens with bleach. If possible, have someone repaint the walls in these rooms with mold-resistant paint. Keep out of the rooms that are being cleaned and painted.  Wash hands frequently. SEEK MEDICAL CARE IF:   You have wheezing, shortness of breath, or a cough even if taking medicine to prevent attacks.  The colored mucus you cough up (sputum) is thicker than usual.  Your sputum changes from clear or white to yellow, green, gray, or bloody.  You have any problems that may be related to the medicines you are taking (such as a rash, itching, swelling, or trouble breathing).  You are using a reliever medicine more than 2-3 times per week.  Your peak flow is still at 50-79% of your personal best after following your action plan for 1 hour.  You have a fever. SEEK IMMEDIATE MEDICAL CARE IF:   You seem to be getting worse and are unresponsive to treatment during an asthma attack.  You are short of breath even at rest.  You get short of breath when doing very little physical activity.  You have difficulty eating, drinking, or talking due to asthma symptoms.  You develop chest pain.  You develop a fast heartbeat.  You have a bluish color to your lips or fingernails.  You are light-headed, dizzy, or faint.  Your peak flow is less than 50% of your personal best.   This information is not intended to replace advice given to you by your health care provider. Make sure you discuss any  questions you have with your health care provider.   Document Released: 05/27/2005 Document Revised: 02/15/2015 Document Reviewed: 12/24/2012 Elsevier Interactive Patient Education 2016 [employer] Inc.  

## 2015-03-14 NOTE — ED Notes (Signed)
Feels much better after svn   Wheezing improved

## 2015-05-24 ENCOUNTER — Encounter: Payer: Self-pay | Admitting: Emergency Medicine

## 2015-05-24 ENCOUNTER — Emergency Department
Admission: EM | Admit: 2015-05-24 | Discharge: 2015-05-24 | Disposition: A | Discharge status: Home / Self Care | Payer: Self-pay | Attending: Emergency Medicine | Admitting: Emergency Medicine

## 2015-05-24 DIAGNOSIS — Z87891 Personal history of nicotine dependence: Secondary | ICD-10-CM | POA: Insufficient documentation

## 2015-05-24 DIAGNOSIS — Z880 Allergy status to penicillin: Secondary | ICD-10-CM | POA: Insufficient documentation

## 2015-05-24 DIAGNOSIS — G40909 Epilepsy, unspecified, not intractable, without status epilepticus: Secondary | ICD-10-CM | POA: Insufficient documentation

## 2015-05-24 DIAGNOSIS — R569 Unspecified convulsions: Secondary | ICD-10-CM

## 2015-05-24 DIAGNOSIS — Z79899 Other long term (current) drug therapy: Secondary | ICD-10-CM | POA: Insufficient documentation

## 2015-05-24 LAB — URINALYSIS COMPLETE WITH MICROSCOPIC (ARMC ONLY)
BACTERIA UA: NONE SEEN
BILIRUBIN URINE: NEGATIVE
GLUCOSE, UA: NEGATIVE mg/dL
Ketones, ur: NEGATIVE mg/dL
LEUKOCYTES UA: NEGATIVE
NITRITE: NEGATIVE
PH: 6 (ref 5.0–8.0)
Protein, ur: NEGATIVE mg/dL
SPECIFIC GRAVITY, URINE: 1.024 (ref 1.005–1.030)
SQUAMOUS EPITHELIAL / LPF: NONE SEEN

## 2015-05-24 LAB — BASIC METABOLIC PANEL
ANION GAP: 5 (ref 5–15)
BUN: 11 mg/dL (ref 6–20)
CHLORIDE: 107 mmol/L (ref 101–111)
CO2: 25 mmol/L (ref 22–32)
Calcium: 9.3 mg/dL (ref 8.9–10.3)
Creatinine, Ser: 1.3 mg/dL — ABNORMAL HIGH (ref 0.61–1.24)
Glucose, Bld: 92 mg/dL (ref 65–99)
POTASSIUM: 3.8 mmol/L (ref 3.5–5.1)
SODIUM: 137 mmol/L (ref 135–145)

## 2015-05-24 LAB — CBC
HEMATOCRIT: 51.1 % (ref 40.0–52.0)
Hemoglobin: 17 g/dL (ref 13.0–18.0)
MCH: 29.8 pg (ref 26.0–34.0)
MCHC: 33.3 g/dL (ref 32.0–36.0)
MCV: 89.4 fL (ref 80.0–100.0)
Platelets: 192 10*3/uL (ref 150–440)
RBC: 5.71 MIL/uL (ref 4.40–5.90)
RDW: 14.9 % — ABNORMAL HIGH (ref 11.5–14.5)
WBC: 6.2 10*3/uL (ref 3.8–10.6)

## 2015-05-24 MED ORDER — LEVETIRACETAM 500 MG PO TABS
ORAL_TABLET | ORAL | Status: AC
  Administered 2015-05-24: 500 mg via ORAL
  Filled 2015-05-24: qty 1

## 2015-05-24 MED ORDER — LEVETIRACETAM 500 MG PO TABS
500.0000 mg | ORAL_TABLET | Freq: Once | ORAL | Status: AC
  Administered 2015-05-24: 500 mg via ORAL

## 2015-05-24 NOTE — ED Notes (Addendum)
Pt has witnessed seizure at home today about 1600, pt has hx of seizures. Pt denies any fall.  Pt wife states seizure lasted 3-5min. Pt A&O, drowsy in triage. Pt takes meds sporadically because of unwanted side effects.

## 2015-05-24 NOTE — ED Provider Notes (Signed)
[facility] Emergency Department Provider Note    ____________________________________________  Time seen: 25  I have reviewed the triage vital signs and the nursing notes.   HISTORY  Chief Complaint Seizures   History limited by: Not Limited   HPI Shane Leach is a 29 y.o. male who presents to the emergency department today because of concerns for seizure. The patient has a history of epilepsy. He states he inconsistently takes his Keppra and has not been taking it recently. He states today he felt some tingling in his neck which is usually a sign that a seizure is coming on. His wife states that it lasted roughly 2-3 minutes. She states it was his typical seizure. She states he was postictal afterwards. He has not fully recovered. Patient did not fall. He was sitting at the time. Patient states that his neurologist has recently moved [place] he has no current neurologist.   Past Medical History  Diagnosis Date  . Seizures ([place])   . Asthma   . Bronchitis   . Epilepsy ([place])     There are no active problems to display for this patient.   Past Surgical History  Procedure Laterality Date  . Testicular cancer      right testicle removed    Current Outpatient Rx  Name  Route  Sig  Dispense  Refill  . albuterol (PROVENTIL HFA;VENTOLIN HFA) 108 (90 BASE) MCG/ACT inhaler   Inhalation   Inhale 2 puffs into the lungs every 6 (six) hours as needed for wheezing or shortness of breath.   1 Inhaler   0   . levETIRAcetam (KEPPRA) 500 MG tablet   Oral   Take 2 tablets (1,000 mg total) by mouth 2 (two) times daily.   60 tablet   0   . predniSONE (DELTASONE) 10 MG tablet      Taper: 6,5,4,3,2,1   21 tablet   0     Allergies Dilantin; Penicillins; and Tegretol  History reviewed. No pertinent family history.  Social History Social History  Substance Use Topics  . Smoking status: Former Smoker -- 0.50 packs/day    Types: Cigarettes    Quit  date: 01/12/2015  . Smokeless tobacco: Never Used  . Alcohol Use: No    Review of Systems  Constitutional: Negative for fever. Cardiovascular: Negative for chest pain. Respiratory: Negative for shortness of breath. Gastrointestinal: Negative for abdominal pain, vomiting and diarrhea. Neurological: Negative for headaches, focal weakness or numbness.   10-point ROS otherwise negative.  ____________________________________________   PHYSICAL EXAM:  VITAL SIGNS: ED Triage Vitals  Enc Vitals Group     BP 05/24/15 1725 122/74 mmHg     Pulse Rate 05/24/15 1725 61     Resp 05/24/15 1725 16     Temp 05/24/15 1725 97.4 F (36.3 C)     Temp Source 05/24/15 1725 Oral     SpO2 05/24/15 1725 98 %     Weight --      Height --      Head Cir --      Peak Flow --      Pain Score 05/24/15 1727 10   Constitutional: Alert and oriented. Well appearing and in no distress. Eyes: Conjunctivae are normal. PERRL. Normal extraocular movements. ENT   Head: Normocephalic and atraumatic.   Nose: No congestion/rhinnorhea.   Mouth/Throat: Mucous membranes are moist.   Neck: No stridor. Hematological/Lymphatic/Immunilogical: No cervical lymphadenopathy. Cardiovascular: Normal rate, regular rhythm.  No murmurs, rubs, or gallops. Respiratory:  Normal respiratory effort without tachypnea nor retractions. Breath sounds are clear and equal bilaterally. No wheezes/rales/rhonchi. Gastrointestinal: Soft and nontender. No distention. There is no CVA tenderness. Genitourinary: Deferred Musculoskeletal: Normal range of motion in all extremities. No joint effusions.  No lower extremity tenderness nor edema. Neurologic:  Normal speech and language. No gross focal neurologic deficits are appreciated.  Skin:  Skin is warm, dry and intact. No rash noted. Psychiatric: Mood and affect are normal. Speech and behavior are normal. Patient exhibits appropriate insight and  judgment.  ____________________________________________    LABS (pertinent positives/negatives)  Labs Reviewed  BASIC METABOLIC PANEL - Abnormal; Notable for the following:    Creatinine, Ser 1.30 (*)    All other components within normal limits  CBC - Abnormal; Notable for the following:    RDW 14.9 (*)    All other components within normal limits  URINALYSIS COMPLETEWITH MICROSCOPIC ([facility] ONLY) - Abnormal; Notable for the following:    Color, Urine YELLOW (*)    APPearance CLEAR (*)    Hgb urine dipstick 1+ (*)    All other components within normal limits     ____________________________________________   EKG  None  ____________________________________________    RADIOLOGY  None   ____________________________________________   PROCEDURES  Procedure(s) performed: None  Critical Care performed: No  ____________________________________________   INITIAL IMPRESSION / ASSESSMENT AND PLAN / ED COURSE  Pertinent labs & imaging results that were available during my care of the patient were reviewed by me and considered in my medical decision making (see chart for details).  Patient with history of seizures who presents to the emergency department today because of concerns for seizure. He states he has not been taking his keppra. Did take one dose roughly 3 hours ago. Denies any pain currently. Back to baseline. Will plan on giving another dose here and neurology follow up.  ____________________________________________   FINAL CLINICAL IMPRESSION(S) / ED DIAGNOSES  Final diagnoses:  Seizure ([place])     Nance Pear, MD 05/24/15 1928

## 2015-05-24 NOTE — Discharge Instructions (Signed)
Please take your keppra as prescribed. As we discussed it is very important that you do not drive or put yourself in any situation that might be dangerous for you or others if you were to have another seizure until you are cleared by a neurologist. Please seek medical attention for any high fevers, chest pain, shortness of breath, change in behavior, persistent vomiting, bloody stool or any other new or concerning symptoms.   Seizure, Adult A seizure is abnormal electrical activity in the brain. Seizures usually last from 30 seconds to 2 minutes. There are various types of seizures. Before a seizure, you may have a warning sensation (aura) that a seizure is about to occur. An aura may include the following symptoms:   Fear or anxiety.  Nausea.  Feeling like the room is spinning (vertigo).  Vision changes, such as seeing flashing lights or spots. Common symptoms during a seizure include:  A change in attention or behavior (altered mental status).  Convulsions with rhythmic jerking movements.  Drooling.  Rapid eye movements.  Grunting.  Loss of bladder and bowel control.  Bitter taste in the mouth.  Tongue biting. After a seizure, you may feel confused and sleepy. You may also have an injury resulting from convulsions during the seizure. HOME CARE INSTRUCTIONS   If you are given medicines, take them exactly as prescribed by your health care provider.  Keep all follow-up appointments as directed by your health care provider.  Do not swim or drive or engage in risky activity during which a seizure could cause further injury to you or others until your health care provider says it is OK.  Get adequate rest.  Teach friends and family what to do if you have a seizure. They should:  Lay you on the ground to prevent a fall.  Put a cushion under your head.  Loosen any tight clothing around your neck.  Turn you on your side. If vomiting occurs, this helps keep your airway  clear.  Stay with you until you recover.  Know whether or not you need emergency care. SEEK IMMEDIATE MEDICAL CARE IF:  The seizure lasts longer than 5 minutes.  The seizure is severe or you do not wake up immediately after the seizure.  You have an altered mental status after the seizure.  You are having more frequent or worsening seizures. Someone should drive you to the emergency department or call local emergency services (911 in [place] MAKE SURE YOU:  Understand these instructions.  Will watch your condition.  Will get help right away if you are not doing well or get worse.   This information is not intended to replace advice given to you by your health care provider. Make sure you discuss any questions you have with your health care provider.   Document Released: 05/24/2000 Document Revised: 06/17/2014 Document Reviewed: 01/06/2013 Elsevier Interactive Patient Education [employer].

## 2015-05-24 NOTE — ED Notes (Signed)
Pt. Going home with wife.  Pt. Will follow up with neurology in 2 days.

## 2015-08-28 ENCOUNTER — Emergency Department: Payer: Self-pay

## 2015-08-28 ENCOUNTER — Emergency Department
Admission: EM | Admit: 2015-08-28 | Discharge: 2015-08-28 | Disposition: A | Discharge status: Home / Self Care | Payer: Self-pay | Attending: Emergency Medicine | Admitting: Emergency Medicine

## 2015-08-28 ENCOUNTER — Encounter: Payer: Self-pay | Admitting: Emergency Medicine

## 2015-08-28 DIAGNOSIS — Z880 Allergy status to penicillin: Secondary | ICD-10-CM | POA: Insufficient documentation

## 2015-08-28 DIAGNOSIS — J209 Acute bronchitis, unspecified: Secondary | ICD-10-CM

## 2015-08-28 DIAGNOSIS — Z87891 Personal history of nicotine dependence: Secondary | ICD-10-CM | POA: Insufficient documentation

## 2015-08-28 DIAGNOSIS — Z79899 Other long term (current) drug therapy: Secondary | ICD-10-CM | POA: Insufficient documentation

## 2015-08-28 DIAGNOSIS — J45901 Unspecified asthma with (acute) exacerbation: Secondary | ICD-10-CM | POA: Insufficient documentation

## 2015-08-28 MED ORDER — ALBUTEROL SULFATE HFA 108 (90 BASE) MCG/ACT IN AERS: 2.0000 | INHALATION_SPRAY | Freq: Four times a day (QID) | RESPIRATORY_TRACT | Status: DC | PRN

## 2015-08-28 MED ORDER — ALBUTEROL SULFATE (2.5 MG/3ML) 0.083% IN NEBU
INHALATION_SOLUTION | RESPIRATORY_TRACT | Status: AC
  Filled 2015-08-28: qty 3

## 2015-08-28 MED ORDER — PREDNISONE 20 MG PO TABS
60.0000 mg | ORAL_TABLET | Freq: Once | ORAL | Status: AC
  Administered 2015-08-28: 60 mg via ORAL
  Filled 2015-08-28: qty 3

## 2015-08-28 MED ORDER — PREDNISONE 20 MG PO TABS: 40.0000 mg | ORAL_TABLET | Freq: Every day | ORAL | Status: AC

## 2015-08-28 MED ORDER — ALBUTEROL SULFATE (2.5 MG/3ML) 0.083% IN NEBU
5.0000 mg | INHALATION_SOLUTION | Freq: Once | RESPIRATORY_TRACT | Status: AC
  Administered 2015-08-28: 5 mg via RESPIRATORY_TRACT

## 2015-08-28 MED ORDER — IPRATROPIUM-ALBUTEROL 0.5-2.5 (3) MG/3ML IN SOLN
3.0000 mL | Freq: Once | RESPIRATORY_TRACT | Status: AC
  Administered 2015-08-28: 3 mL via RESPIRATORY_TRACT
  Filled 2015-08-28: qty 3

## 2015-08-28 MED ORDER — AZITHROMYCIN 250 MG PO TABS: ORAL_TABLET | ORAL | Status: DC

## 2015-08-28 NOTE — ED Notes (Signed)
Pt presents with cold symptoms and wheezing since yesterday with green sputum.

## 2015-08-28 NOTE — ED Notes (Signed)
Pt states SOB worsening last night, states green productive cough, pt speaking in full clear sentances in no acute distress, MD at bedside

## 2015-08-28 NOTE — ED Provider Notes (Signed)
Time Seen: Approximately 1513 I have reviewed the triage notes  Chief Complaint: Wheezing   History of Present Illness: Shane Leach is a 30 y.o. male *who has a history of asthma and bronchitis. Patient states a 2 day history of increasing shortness of breath with green tinged productive sputum. He denies any chest pain. He states he feels wheezing mostly at night. Patient denies any hemoptysis. He denies any leg pain or swelling.   Past Medical History  Diagnosis Date  . Seizures ([place])   . Asthma   . Bronchitis   . Epilepsy ([place])     There are no active problems to display for this patient.   Past Surgical History  Procedure Laterality Date  . Testicular cancer      right testicle removed    Past Surgical History  Procedure Laterality Date  . Testicular cancer      right testicle removed    Current Outpatient Rx  Name  Route  Sig  Dispense  Refill  . albuterol (PROVENTIL HFA;VENTOLIN HFA) 108 (90 Base) MCG/ACT inhaler   Inhalation   Inhale 2 puffs into the lungs every 6 (six) hours as needed for wheezing or shortness of breath.   1 Inhaler   2   . azithromycin (ZITHROMAX Z-PAK) 250 MG tablet      Take 2 tablets (500 mg) on  Day 1,  followed by 1 tablet (250 mg) once daily on Days 2 through 5.   6 each   0   . levETIRAcetam (KEPPRA) 500 MG tablet   Oral   Take 2 tablets (1,000 mg total) by mouth 2 (two) times daily.   60 tablet   0   . predniSONE (DELTASONE) 20 MG tablet   Oral   Take 2 tablets (40 mg total) by mouth daily.   10 tablet   0     Allergies:  Dilantin; Penicillins; and Tegretol  Family History: No family history on file.  Social History: Social History  Substance Use Topics  . Smoking status: Former Smoker -- 0.50 packs/day    Types: Cigarettes    Quit date: 01/12/2015  . Smokeless tobacco: Never Used  . Alcohol Use: No     Review of Systems:   10 point review of systems was performed and was otherwise  negative:  Constitutional: No fever Eyes: No visual disturbances ENT: No sore throat, ear pain Cardiac: No chest pain Respiratory: Shortness of breath with a history of wheezing and asthma, no stridor Abdomen: No abdominal pain, no vomiting, No diarrhea Endocrine: No weight loss, No night sweats Extremities: No peripheral edema, cyanosis Skin: No rashes, easy bruising Neurologic: No focal weakness, trouble with speech or swollowing   Physical Exam:  ED Triage Vitals  Enc Vitals Group     BP 08/28/15 1357 137/93 mmHg     Pulse Rate 08/28/15 1357 74     Resp 08/28/15 1357 24     Temp 08/28/15 1357 97.6 F (36.4 C)     Temp Source 08/28/15 1357 Oral     SpO2 08/28/15 1357 96 %     Weight 08/28/15 1357 195 lb (88.451 kg)     Height 08/28/15 1357 6' (1.829 m)     Head Cir --      Peak Flow --      Pain Score 08/28/15 1400 9     Pain Loc --      Pain Edu? --      Excl. in  GC? --     General: Awake , Alert , and Oriented times 3; GCS 15 no obvious signs of respiratory distress and speaks in full and complete sentences Head: Normal cephalic , atraumatic Eyes: Pupils equal , round, reactive to light Nose/Throat: No nasal drainage, patent upper airway without erythema or exudate.  Neck: Supple, Full range of motion, No anterior adenopathy or palpable thyroid masses Lungs: Coarse breath sounds auscultated bilaterally with end expiratory wheezing heard primarily at the apices. Heart: Regular rate, regular rhythm without murmurs , gallops , or rubs Abdomen: Soft, non tender without rebound, guarding , or rigidity; bowel sounds positive and symmetric in all 4 quadrants. No organomegaly .        Extremities: 2 plus symmetric pulses. No edema, clubbing or cyanosis Neurologic: normal ambulation, Motor symmetric without deficits, sensory intact Skin: warm, dry, no rashes   L Radiology:  CLINICAL DATA: Wheezing and difficulty breathing for several days  EXAM: CHEST 2  VIEW  COMPARISON: 03/14/2015  FINDINGS: Lungs are again hyperexpanded. The cardiac shadow is within normal limits. The bony structures are within normal limits. No other focal abnormality is noted.  IMPRESSION: No acute abnormality seen.     I personally reviewed the radiologic studies    ED Course: * Patient appears to have bronchitis with bronchospasm. Patient was given a DuoNeb here with symptomatic relief and states he has no inhaler at home. He was started on steroid therapy with prednisone and will be discharged with prescription of prednisone. He is advised drink plenty of fluids and return here if he has any high fever or increased shortness of breath. We discussed the causes for bronchitis are mostly viral in nature but due to his history of asthma and with a productive cough I thought he would benefit with antibiotic therapy and we discussed that most of these cases are generally allergic or viral in nature. He was advised follow-up with his primary physician.    Assessment: * Acute bronchitis with bronchospasm  Final Clinical Impression:   Final diagnoses:  Bronchitis with bronchospasm     Plan:  Outpatient management Patient was advised to return immediately if condition worsens. Patient was advised to follow up with their primary care physician or other specialized physicians involved in their outpatient care. The patient and/or family member/power of attorney had laboratory results reviewed at the bedside. All questions and concerns were addressed and appropriate discharge instructions were distributed by the nursing staff.            Daymon Larsen, MD 08/28/15 [id number]

## 2015-08-28 NOTE — Discharge Instructions (Signed)
Upper Respiratory Infection, Adult Most upper respiratory infections (URIs) are a viral infection of the air passages leading to the lungs. A URI affects the nose, throat, and upper air passages. The most common type of URI is nasopharyngitis and is typically referred to as "the common cold." URIs run their course and usually go away on their own. Most of the time, a URI does not require medical attention, but sometimes a bacterial infection in the upper airways can follow a viral infection. This is called a secondary infection. Sinus and middle ear infections are common types of secondary upper respiratory infections. Bacterial pneumonia can also complicate a URI. A URI can worsen asthma and chronic obstructive pulmonary disease (COPD). Sometimes, these complications can require emergency medical care and may be life threatening.  CAUSES Almost all URIs are caused by viruses. A virus is a type of germ and can spread from one person to another.  RISKS FACTORS You may be at risk for a URI if:   You smoke.   You have chronic heart or lung disease.  You have a weakened defense (immune) system.   You are very young or very old.   You have nasal allergies or asthma.  You work in crowded or poorly ventilated areas.  You work in health care facilities or schools. SIGNS AND SYMPTOMS  Symptoms typically develop 2-3 days after you come in contact with a cold virus. Most viral URIs last 7-10 days. However, viral URIs from the influenza virus (flu virus) can last 14-18 days and are typically more severe. Symptoms may include:   Runny or stuffy (congested) nose.   Sneezing.   Cough.   Sore throat.   Headache.   Fatigue.   Fever.   Loss of appetite.   Pain in your forehead, behind your eyes, and over your cheekbones (sinus pain).  Muscle aches.  DIAGNOSIS  Your health care provider may diagnose a URI by:  Physical exam.  Tests to check that your symptoms are not due to  another condition such as:  Strep throat.  Sinusitis.  Pneumonia.  Asthma. TREATMENT  A URI goes away on its own with time. It cannot be cured with medicines, but medicines may be prescribed or recommended to relieve symptoms. Medicines may help:  Reduce your fever.  Reduce your cough.  Relieve nasal congestion. HOME CARE INSTRUCTIONS   Take medicines only as directed by your health care provider.   Gargle warm saltwater or take cough drops to comfort your throat as directed by your health care provider.  Use a warm mist humidifier or inhale steam from a shower to increase air moisture. This may make it easier to breathe.  Drink enough fluid to keep your urine clear or pale yellow.   Eat soups and other clear broths and maintain good nutrition.   Rest as needed.   Return to work when your temperature has returned to normal or as your health care provider advises. You may need to stay home longer to avoid infecting others. You can also use a face mask and careful hand washing to prevent spread of the virus.  Increase the usage of your inhaler if you have asthma.   Do not use any tobacco products, including cigarettes, chewing tobacco, or electronic cigarettes. If you need help quitting, ask your health care provider. PREVENTION  The best way to protect yourself from getting a cold is to practice good hygiene.   Avoid oral or hand contact with people with cold  symptoms.   Wash your hands often if contact occurs.  There is no clear evidence that vitamin C, vitamin E, echinacea, or exercise reduces the chance of developing a cold. However, it is always recommended to get plenty of rest, exercise, and practice good nutrition.  SEEK MEDICAL CARE IF:   You are getting worse rather than better.   Your symptoms are not controlled by medicine.   You have chills.  You have worsening shortness of breath.  You have brown or red mucus.  You have yellow or brown nasal  discharge.  You have pain in your face, especially when you bend forward.  You have a fever.  You have swollen neck glands.  You have pain while swallowing.  You have white areas in the back of your throat. SEEK IMMEDIATE MEDICAL CARE IF:   You have severe or persistent:  Headache.  Ear pain.  Sinus pain.  Chest pain.  You have chronic lung disease and any of the following:  Wheezing.  Prolonged cough.  Coughing up blood.  A change in your usual mucus.  You have a stiff neck.  You have changes in your:  Vision.  Hearing.  Thinking.  Mood. MAKE SURE YOU:   Understand these instructions.  Will watch your condition.  Will get help right away if you are not doing well or get worse.   This information is not intended to replace advice given to you by your health care provider. Make sure you discuss any questions you have with your health care provider.   Document Released: 11/20/2000 Document Revised: 10/11/2014 Document Reviewed: 09/01/2013 Elsevier Interactive Patient Education [employer].  Please return immediately if condition worsens. Please contact her primary physician or the physician you were given for referral. If you have any specialist physicians involved in her treatment and plan please also contact them. Thank you for using [facility].

## 2015-10-23 ENCOUNTER — Encounter: Payer: Self-pay | Admitting: Medical Oncology

## 2015-10-23 ENCOUNTER — Emergency Department
Admission: EM | Admit: 2015-10-23 | Discharge: 2015-10-23 | Disposition: A | Discharge status: Home / Self Care | Payer: Self-pay | Attending: Emergency Medicine | Admitting: Emergency Medicine

## 2015-10-23 DIAGNOSIS — G40909 Epilepsy, unspecified, not intractable, without status epilepticus: Secondary | ICD-10-CM | POA: Insufficient documentation

## 2015-10-23 DIAGNOSIS — J45909 Unspecified asthma, uncomplicated: Secondary | ICD-10-CM | POA: Insufficient documentation

## 2015-10-23 DIAGNOSIS — R569 Unspecified convulsions: Secondary | ICD-10-CM

## 2015-10-23 DIAGNOSIS — Z87891 Personal history of nicotine dependence: Secondary | ICD-10-CM | POA: Insufficient documentation

## 2015-10-23 LAB — COMPREHENSIVE METABOLIC PANEL
ALT: 23 U/L (ref 17–63)
AST: 21 U/L (ref 15–41)
Albumin: 4.7 g/dL (ref 3.5–5.0)
Alkaline Phosphatase: 50 U/L (ref 38–126)
Anion gap: 4 — ABNORMAL LOW (ref 5–15)
BUN: 16 mg/dL (ref 6–20)
CO2: 26 mmol/L (ref 22–32)
Calcium: 9.5 mg/dL (ref 8.9–10.3)
Chloride: 108 mmol/L (ref 101–111)
Creatinine, Ser: 1.15 mg/dL (ref 0.61–1.24)
GFR calc Af Amer: >60 mL/min (ref >60)
GFR calc non Af Amer: >60 mL/min (ref >60)
Glucose, Bld: 95 mg/dL (ref 65–99)
Potassium: 4.5 mmol/L (ref 3.5–5.1)
Sodium: 138 mmol/L (ref 135–145)
Total Bilirubin: 0.5 mg/dL (ref 0.3–1.2)
Total Protein: 7.3 g/dL (ref 6.5–8.1)

## 2015-10-23 LAB — CBC WITH DIFFERENTIAL/PLATELET
BASOS PCT: 1 %
Basophils Absolute: 0.1 10*3/uL (ref 0–0.1)
EOS ABS: 0.4 10*3/uL (ref 0–0.7)
Eosinophils Relative: 6 %
HEMATOCRIT: 50.3 % (ref 40.0–52.0)
Hemoglobin: 17 g/dL (ref 13.0–18.0)
LYMPHS ABS: 1.7 10*3/uL (ref 1.0–3.6)
Lymphocytes Relative: 26 %
MCH: 29.8 pg (ref 26.0–34.0)
MCHC: 33.7 g/dL (ref 32.0–36.0)
MCV: 88.5 fL (ref 80.0–100.0)
MONO ABS: 0.4 10*3/uL (ref 0.2–1.0)
MONOS PCT: 7 %
Neutro Abs: 4 10*3/uL (ref 1.4–6.5)
Neutrophils Relative %: 60 %
Platelets: 235 10*3/uL (ref 150–440)
RBC: 5.69 MIL/uL (ref 4.40–5.90)
RDW: 14.6 % — AB (ref 11.5–14.5)
WBC: 6.6 10*3/uL (ref 3.8–10.6)

## 2015-10-23 MED ORDER — SODIUM CHLORIDE 0.9 % IV SOLN
1500.0000 mg | Freq: Once | INTRAVENOUS | Status: AC
  Administered 2015-10-23: 1500 mg via INTRAVENOUS
  Filled 2015-10-23: qty 15

## 2015-10-23 NOTE — ED Notes (Addendum)
Pt reports he had a seizure this am, has a hx of epilepsy, reports he has not been compliant with meds d/t work. Pt reports he takes keppra.

## 2015-10-23 NOTE — Discharge Instructions (Signed)

## 2015-10-23 NOTE — ED Provider Notes (Signed)
[facility] Emergency Department Provider Note        Time seen: ----------------------------------------- 5:15 PM on 10/23/2015 -----------------------------------------    I have reviewed the triage vital signs and the nursing notes.   HISTORY  Chief Complaint Seizures    HPI Shane Leach is a 30 y.o. male who presents to ER after having had a seizure this morning. Patient reports history of epilepsy and states he's been noncompliant with medications due to his work. Patient states he takes his medicines he can't work. He reports feeling tired, did urinate somewhat on himself. Otherwise he denies complaints this time.   Past Medical History  Diagnosis Date  . Seizures ([employer])   . Asthma   . Bronchitis   . Epilepsy ([place])     There are no active problems to display for this patient.   Past Surgical History  Procedure Laterality Date  . Testicular cancer      right testicle removed    Allergies Dilantin; Penicillins; and Tegretol  Social History Social History  Substance Use Topics  . Smoking status: Former Smoker -- 0.50 packs/day    Types: Cigarettes    Quit date: 01/12/2015  . Smokeless tobacco: Never Used  . Alcohol Use: No    Review of Systems Constitutional: Negative for fever. Eyes: Negative for visual changes. ENT: Negative for sore throat. Cardiovascular: Negative for chest pain. Respiratory: Negative for shortness of breath. Gastrointestinal: Negative for abdominal pain, vomiting and diarrhea. Genitourinary: Negative for dysuria. Musculoskeletal: Negative for back pain. Skin: Negative for rash. Neurological: Positive for weakness ____________________________________________   PHYSICAL EXAM:  VITAL SIGNS: ED Triage Vitals  Enc Vitals Group     BP 10/23/15 1527 130/90 mmHg     Pulse Rate 10/23/15 1527 64     Resp 10/23/15 1527 18     Temp 10/23/15 1527 98.3 F (36.8 C)     Temp Source 10/23/15 1527 Oral      SpO2 10/23/15 1527 98 %     Weight 10/23/15 1527 190 lb (86.183 kg)     Height 10/23/15 1527 6' (1.829 m)     Head Cir --      Peak Flow --      Pain Score 10/23/15 1528 10     Pain Loc --      Pain Edu? --      Excl. in [place]? --     Constitutional: Alert and oriented. Well appearing and in no distress. Eyes: Conjunctivae are normal. PERRL. Normal extraocular movements. ENT   Head: Normocephalic and atraumatic.   Nose: No congestion/rhinnorhea.   Mouth/Throat: Mucous membranes are moist.   Neck: No stridor. Cardiovascular: Normal rate, regular rhythm. No murmurs, rubs, or gallops. Respiratory: Normal respiratory effort without tachypnea nor retractions. Breath sounds are clear and equal bilaterally. No wheezes/rales/rhonchi. Gastrointestinal: Soft and nontender. Normal bowel sounds Musculoskeletal: Nontender with normal range of motion in all extremities. No lower extremity tenderness nor edema. Neurologic:  Normal speech and language. No gross focal neurologic deficits are appreciated.  Skin:  Skin is warm, dry and intact. No rash noted. Psychiatric: Mood and affect are normal. Speech and behavior are normal.   ____________________________________________  ED COURSE:  Pertinent labs & imaging results that were available during my care of the patient were reviewed by me and considered in my medical decision making (see chart for details). Patient is in no acute distress, likely seizure from noncompliance. Patient will receive IV of Keppra and reevaluation. ____________________________________________  LABS (pertinent positives/negatives)  Labs Reviewed  CBC WITH DIFFERENTIAL/PLATELET - Abnormal; Notable for the following:    RDW 14.6 (*)    All other components within normal limits  COMPREHENSIVE METABOLIC PANEL - Abnormal; Notable for the following:    Anion gap 4 (*)    All other components within normal limits    ____________________________________________  FINAL ASSESSMENT AND PLAN  Seizure  Plan: Patient with labs as dictated above. Patient has received IV Keppra, is currently asymptomatic. He is discharged and stable for outpatient follow-up.   Earleen Newport, MD   Note: This dictation was prepared with Dragon dictation. Any transcriptional errors that result from this process are unintentional   Earleen Newport, MD 10/23/15 1725

## 2016-02-25 ENCOUNTER — Encounter: Payer: Self-pay | Admitting: Emergency Medicine

## 2016-02-25 ENCOUNTER — Emergency Department
Admission: EM | Admit: 2016-02-25 | Discharge: 2016-02-25 | Disposition: A | Discharge status: Home / Self Care | Payer: Self-pay | Attending: Emergency Medicine | Admitting: Emergency Medicine

## 2016-02-25 DIAGNOSIS — Z87891 Personal history of nicotine dependence: Secondary | ICD-10-CM | POA: Insufficient documentation

## 2016-02-25 DIAGNOSIS — R531 Weakness: Secondary | ICD-10-CM | POA: Insufficient documentation

## 2016-02-25 DIAGNOSIS — G40909 Epilepsy, unspecified, not intractable, without status epilepticus: Secondary | ICD-10-CM | POA: Insufficient documentation

## 2016-02-25 DIAGNOSIS — Z8547 Personal history of malignant neoplasm of testis: Secondary | ICD-10-CM | POA: Insufficient documentation

## 2016-02-25 DIAGNOSIS — Z79899 Other long term (current) drug therapy: Secondary | ICD-10-CM | POA: Insufficient documentation

## 2016-02-25 DIAGNOSIS — R569 Unspecified convulsions: Secondary | ICD-10-CM

## 2016-02-25 DIAGNOSIS — Z792 Long term (current) use of antibiotics: Secondary | ICD-10-CM | POA: Insufficient documentation

## 2016-02-25 DIAGNOSIS — J45909 Unspecified asthma, uncomplicated: Secondary | ICD-10-CM | POA: Insufficient documentation

## 2016-02-25 LAB — CBC
HCT: 51.8 % (ref 40.0–52.0)
Hemoglobin: 18.2 g/dL — ABNORMAL HIGH (ref 13.0–18.0)
MCH: 31.1 pg (ref 26.0–34.0)
MCHC: 35.1 g/dL (ref 32.0–36.0)
MCV: 88.6 fL (ref 80.0–100.0)
PLATELETS: 238 10*3/uL (ref 150–440)
RBC: 5.85 MIL/uL (ref 4.40–5.90)
RDW: 14.8 % — ABNORMAL HIGH (ref 11.5–14.5)
WBC: 7.4 10*3/uL (ref 3.8–10.6)

## 2016-02-25 LAB — URINALYSIS COMPLETE WITH MICROSCOPIC (ARMC ONLY)
BILIRUBIN URINE: NEGATIVE
Bacteria, UA: NONE SEEN
Glucose, UA: NEGATIVE mg/dL
KETONES UR: NEGATIVE mg/dL
Leukocytes, UA: NEGATIVE
Nitrite: NEGATIVE
PH: 5 (ref 5.0–8.0)
Protein, ur: NEGATIVE mg/dL
Specific Gravity, Urine: 1.021 (ref 1.005–1.030)

## 2016-02-25 LAB — BASIC METABOLIC PANEL
ANION GAP: 7 (ref 5–15)
BUN: 17 mg/dL (ref 6–20)
CALCIUM: 9.7 mg/dL (ref 8.9–10.3)
CO2: 24 mmol/L (ref 22–32)
Chloride: 108 mmol/L (ref 101–111)
Creatinine, Ser: 1.16 mg/dL (ref 0.61–1.24)
Glucose, Bld: 100 mg/dL — ABNORMAL HIGH (ref 65–99)
Potassium: 4 mmol/L (ref 3.5–5.1)
SODIUM: 139 mmol/L (ref 135–145)

## 2016-02-25 MED ORDER — KETOROLAC TROMETHAMINE 30 MG/ML IJ SOLN
30.0000 mg | Freq: Once | INTRAMUSCULAR | Status: AC
  Administered 2016-02-25: 30 mg via INTRAVENOUS

## 2016-02-25 MED ORDER — SODIUM CHLORIDE 0.9 % IV SOLN
1000.0000 mg | Freq: Once | INTRAVENOUS | Status: AC
  Administered 2016-02-25: 1000 mg via INTRAVENOUS
  Filled 2016-02-25: qty 10

## 2016-02-25 MED ORDER — DIPHENHYDRAMINE HCL 50 MG/ML IJ SOLN
25.0000 mg | Freq: Once | INTRAMUSCULAR | Status: AC
  Administered 2016-02-25: 25 mg via INTRAVENOUS

## 2016-02-25 MED ORDER — IBUPROFEN 800 MG PO TABS: 800.0000 mg | ORAL_TABLET | Freq: Three times a day (TID) | ORAL | 0 refills | Status: DC | PRN

## 2016-02-25 MED ORDER — KETOROLAC TROMETHAMINE 30 MG/ML IJ SOLN
INTRAMUSCULAR | Status: AC
  Administered 2016-02-25: 30 mg via INTRAVENOUS
  Filled 2016-02-25: qty 1

## 2016-02-25 MED ORDER — DIPHENHYDRAMINE HCL 50 MG/ML IJ SOLN
INTRAMUSCULAR | Status: AC
  Administered 2016-02-25: 25 mg via INTRAVENOUS
  Filled 2016-02-25: qty 1

## 2016-02-25 MED ORDER — DIAZEPAM 5 MG PO TABS: 5.0000 mg | ORAL_TABLET | Freq: Three times a day (TID) | ORAL | 0 refills | Status: DC | PRN

## 2016-02-25 NOTE — ED Provider Notes (Signed)
[facility] Emergency Department Provider Note        Time seen: ----------------------------------------- 3:46 PM on 02/25/2016 -----------------------------------------    I have reviewed the triage vital signs and the nursing notes.   HISTORY  Chief Complaint Weakness and Seizures    HPI Shane Leach is a 30 y.o. male presents to ER after he had a seizure yesterday and today both her witnessed by the wife. Wife states seizure yesterday was worse and today, this morning he had it while in his sleep area patient plans weakness today in his upper or lower extremities. Patient states he does take seizure medicines but has not been compliant due to the way it makes him feel. His last seizure was in March of this year. He denies recent illness or other complaints.   Past Medical History:  Diagnosis Date  . Asthma   . Bronchitis   . Epilepsy ([place])   . Seizures ([place])     There are no active problems to display for this patient.   Past Surgical History:  Procedure Laterality Date  . testicular cancer     right testicle removed    Allergies Dilantin [phenytoin sodium extended]; Penicillins; and Tegretol [carbamazepine]  Social History Social History  Substance Use Topics  . Smoking status: Former Smoker    Packs/day: 0.50    Types: Cigarettes    Quit date: 01/12/2015  . Smokeless tobacco: Never Used  . Alcohol use No    Review of Systems Constitutional: Negative for fever. Cardiovascular: Negative for chest pain. Respiratory: Negative for shortness of breath. Gastrointestinal: Negative for abdominal pain, vomiting and diarrhea. Genitourinary: Negative for dysuria. Musculoskeletal: Positive for muscle aches Skin: Negative for rash. Neurological: Positive for weakness  10-point ROS otherwise negative.  ____________________________________________   PHYSICAL EXAM:  VITAL SIGNS: ED Triage Vitals [02/25/16 1530]  Enc Vitals  Group     BP 131/87     Pulse Rate 80     Resp 18     Temp 97.7 F (36.5 C)     Temp Source Oral     SpO2 96 %     Weight 210 lb (95.3 kg)     Height 5\' 11"  (1.803 m)     Head Circumference      Peak Flow      Pain Score 8     Pain Loc      Pain Edu?      Excl. in [place]?     Constitutional: Alert and oriented. Well appearing and in no distress. Eyes: Conjunctivae are normal. PERRL. Normal extraocular movements. ENT   Head: Normocephalic and atraumatic.   Nose: No congestion/rhinnorhea.   Mouth/Throat: Mucous membranes are moist.   Neck: No stridor. Cardiovascular: Normal rate, regular rhythm. No murmurs, rubs, or gallops. Respiratory: Normal respiratory effort without tachypnea nor retractions. Breath sounds are clear and equal bilaterally. No wheezes/rales/rhonchi. Gastrointestinal: Soft and nontender. Normal bowel sounds Musculoskeletal: Nontender with normal range of motion in all extremities. No lower extremity tenderness nor edema. Neurologic:  Normal speech and language. No gross focal neurologic deficits are appreciated.  Skin:  Skin is warm, dry and intact. No rash noted. Psychiatric: Mood and affect are normal. Speech and behavior are normal.  ____________________________________________  EKG: Interpreted by me. Sinus rhythm with sinus arrhythmia, rate is 79 bpm, normal PR interval, normal QRS, normal interval. Left axis deviation  ____________________________________________  ED COURSE:  Pertinent labs & imaging results that were available during my care of  the patient were reviewed by me and considered in my medical decision making (see chart for details). Clinical Course  Patient presents to the ER status post 2 seizures in the last 24 hours due to noncompliance. He will receive IV seizure medication, we will assess with basic labs.  Procedures ____________________________________________   LABS (pertinent positives/negatives)  Labs Reviewed   BASIC METABOLIC PANEL - Abnormal; Notable for the following:       Result Value   Glucose, Bld 100 (*)    All other components within normal limits  CBC - Abnormal; Notable for the following:    Hemoglobin 18.2 (*)    RDW 14.8 (*)    All other components within normal limits  URINALYSIS COMPLETEWITH MICROSCOPIC ([facility] ONLY) - Abnormal; Notable for the following:    Color, Urine YELLOW (*)    APPearance CLEAR (*)    Hgb urine dipstick 1+ (*)    Squamous Epithelial / LPF 0-5 (*)    All other components within normal limits  CBG MONITORING, ED  ____________________________________________  FINAL ASSESSMENT AND PLAN  Seizure  Plan: Patient with labs as dictated above. Patient is in no acute distress, he has been loaded with IV Keppra. He is advised to be compliant with his medications, I will prescribe Motrin and Valium for his symptoms status post seizure. He is stable for discharge.   Earleen Newport, MD   Note: This dictation was prepared with Dragon dictation. Any transcriptional errors that result from this process are unintentional    Earleen Newport, MD 02/25/16 1651

## 2016-02-25 NOTE — ED Triage Notes (Signed)
Pt arrived to ED with wife with reports of seizure yesterday and today. Both were witnessed by wife. Wife states seizure yesterday was worse than today. EMS did assess patient yesterday but did not want to come to the hospital. Pt c/o of weakness today to his upper and lower extremities feel weak and painful. Pt states he does take seizure medication daily 500mg  Keppra BID. Pt states he did not take his medication today. Pt states he feels a taste in his mouth and he feels it coming on.  Last seizure was in March 2017.

## 2016-02-25 NOTE — ED Notes (Signed)
Wife denies any head injury and states she helped him on to the floor both times he had a seizure.

## 2016-03-17 ENCOUNTER — Emergency Department: Payer: Self-pay

## 2016-03-17 ENCOUNTER — Emergency Department
Admission: EM | Admit: 2016-03-17 | Discharge: 2016-03-17 | Disposition: A | Discharge status: Home / Self Care | Payer: Self-pay | Attending: Emergency Medicine | Admitting: Emergency Medicine

## 2016-03-17 ENCOUNTER — Encounter: Payer: Self-pay | Admitting: Emergency Medicine

## 2016-03-17 DIAGNOSIS — J45909 Unspecified asthma, uncomplicated: Secondary | ICD-10-CM | POA: Insufficient documentation

## 2016-03-17 DIAGNOSIS — R220 Localized swelling, mass and lump, head: Secondary | ICD-10-CM

## 2016-03-17 DIAGNOSIS — Z87891 Personal history of nicotine dependence: Secondary | ICD-10-CM | POA: Insufficient documentation

## 2016-03-17 DIAGNOSIS — Z791 Long term (current) use of non-steroidal anti-inflammatories (NSAID): Secondary | ICD-10-CM | POA: Insufficient documentation

## 2016-03-17 DIAGNOSIS — K047 Periapical abscess without sinus: Secondary | ICD-10-CM | POA: Insufficient documentation

## 2016-03-17 DIAGNOSIS — Z8547 Personal history of malignant neoplasm of testis: Secondary | ICD-10-CM | POA: Insufficient documentation

## 2016-03-17 DIAGNOSIS — Z792 Long term (current) use of antibiotics: Secondary | ICD-10-CM | POA: Insufficient documentation

## 2016-03-17 DIAGNOSIS — Z7952 Long term (current) use of systemic steroids: Secondary | ICD-10-CM | POA: Insufficient documentation

## 2016-03-17 DIAGNOSIS — Z79899 Other long term (current) drug therapy: Secondary | ICD-10-CM | POA: Insufficient documentation

## 2016-03-17 LAB — CBC WITH DIFFERENTIAL/PLATELET
BASOS ABS: 0.1 10*3/uL (ref 0–0.1)
BASOS PCT: 0 %
EOS PCT: 2 %
Eosinophils Absolute: 0.3 10*3/uL (ref 0–0.7)
HEMATOCRIT: 49.8 % (ref 40.0–52.0)
Hemoglobin: 17 g/dL (ref 13.0–18.0)
Lymphocytes Relative: 11 %
Lymphs Abs: 1.6 10*3/uL (ref 1.0–3.6)
MCH: 30.3 pg (ref 26.0–34.0)
MCHC: 34.2 g/dL (ref 32.0–36.0)
MCV: 88.7 fL (ref 80.0–100.0)
MONO ABS: 1 10*3/uL (ref 0.2–1.0)
MONOS PCT: 7 %
NEUTROS ABS: 11.6 10*3/uL — AB (ref 1.4–6.5)
Neutrophils Relative %: 80 %
PLATELETS: 206 10*3/uL (ref 150–440)
RBC: 5.62 MIL/uL (ref 4.40–5.90)
RDW: 14.6 % — AB (ref 11.5–14.5)
WBC: 14.7 10*3/uL — ABNORMAL HIGH (ref 3.8–10.6)

## 2016-03-17 LAB — BASIC METABOLIC PANEL
Anion gap: 10 (ref 5–15)
BUN: 14 mg/dL (ref 6–20)
CALCIUM: 9.1 mg/dL (ref 8.9–10.3)
CO2: 19 mmol/L — AB (ref 22–32)
CREATININE: 1.18 mg/dL (ref 0.61–1.24)
Chloride: 108 mmol/L (ref 101–111)
GFR calc Af Amer: >60 mL/min (ref >60)
GLUCOSE: 106 mg/dL — AB (ref 65–99)
Potassium: 3.7 mmol/L (ref 3.5–5.1)
Sodium: 137 mmol/L (ref 135–145)

## 2016-03-17 MED ORDER — HYDROMORPHONE HCL 1 MG/ML IJ SOLN
0.5000 mg | Freq: Once | INTRAMUSCULAR | Status: AC
  Administered 2016-03-17: 0.5 mg via INTRAVENOUS
  Filled 2016-03-17: qty 1

## 2016-03-17 MED ORDER — CLINDAMYCIN HCL 300 MG PO CAPS
300.0000 mg | ORAL_CAPSULE | Freq: Three times a day (TID) | ORAL | 0 refills | Status: AC
Start: 2016-03-17 — End: 2016-03-27

## 2016-03-17 MED ORDER — IOPAMIDOL (ISOVUE-300) INJECTION 61%
75.0000 mL | Freq: Once | INTRAVENOUS | Status: AC | PRN
  Administered 2016-03-17: 75 mL via INTRAVENOUS

## 2016-03-17 MED ORDER — CLINDAMYCIN PHOSPHATE 600 MG/50ML IV SOLN
600.0000 mg | Freq: Once | INTRAVENOUS | Status: AC
  Administered 2016-03-17: 600 mg via INTRAVENOUS
  Filled 2016-03-17: qty 50

## 2016-03-17 MED ORDER — MORPHINE SULFATE (PF) 2 MG/ML IV SOLN
2.0000 mg | Freq: Once | INTRAVENOUS | Status: AC
  Administered 2016-03-17: 2 mg via INTRAVENOUS
  Filled 2016-03-17: qty 1

## 2016-03-17 MED ORDER — OXYCODONE-ACETAMINOPHEN 5-325 MG PO TABS: 1.0000 | ORAL_TABLET | Freq: Four times a day (QID) | ORAL | 0 refills | Status: DC | PRN

## 2016-03-17 MED ORDER — ONDANSETRON HCL 4 MG/2ML IJ SOLN
4.0000 mg | Freq: Once | INTRAMUSCULAR | Status: AC
  Administered 2016-03-17: 4 mg via INTRAVENOUS
  Filled 2016-03-17: qty 2

## 2016-03-17 NOTE — ED Provider Notes (Signed)
[facility] Emergency Department Provider Note   ____________________________________________   First MD Initiated Contact with Patient 03/17/16 [phone]     (approximate)  I have reviewed the triage vital signs and the nursing notes.   HISTORY  Chief Complaint Dental Pain and Facial Swelling    HPI KASSIUS FETHEROLF is a 30 y.o. male she reports several days of pain in his upper teeth and then swelling in his face beginning this morning. His face is hurting a lot and the swelling is getting worse. Patient is not running a fever at home. He has no other symptoms.  Past Medical History:  Diagnosis Date  . Asthma   . Bronchitis   . Epilepsy ([place])   . Seizures ([place])     There are no active problems to display for this patient.   Past Surgical History:  Procedure Laterality Date  . testicular cancer     right testicle removed    Prior to Admission medications   Medication Sig Start Date End Date Taking? Authorizing Provider  albuterol (PROVENTIL HFA;VENTOLIN HFA) 108 (90 Base) MCG/ACT inhaler Inhale 2 puffs into the lungs every 6 (six) hours as needed for wheezing or shortness of breath. 08/28/15   Daymon Larsen, MD  azithromycin (ZITHROMAX Z-PAK) 250 MG tablet Take 2 tablets (500 mg) on  Day 1,  followed by 1 tablet (250 mg) once daily on Days 2 through 5. 08/28/15   Daymon Larsen, MD  clindamycin (CLEOCIN) 300 MG capsule Take 1 capsule (300 mg total) by mouth 3 (three) times daily. 03/17/16 03/27/16  Nena Polio, MD  diazepam (VALIUM) 5 MG tablet Take 1 tablet (5 mg total) by mouth every 8 (eight) hours as needed for muscle spasms. 02/25/16   Earleen Newport, MD  ibuprofen (ADVIL,MOTRIN) 800 MG tablet Take 1 tablet (800 mg total) by mouth every 8 (eight) hours as needed. 02/25/16   Earleen Newport, MD  levETIRAcetam (KEPPRA) 500 MG tablet Take 2 tablets (1,000 mg total) by mouth 2 (two) times daily. 10/25/14   Nance Pear, MD    oxyCODONE-acetaminophen (ROXICET) 5-325 MG tablet Take 1 tablet by mouth every 6 (six) hours as needed. 03/17/16 03/17/17  Nena Polio, MD  predniSONE (DELTASONE) 20 MG tablet Take 2 tablets (40 mg total) by mouth daily. 08/29/15 09/02/16  Daymon Larsen, MD    Allergies Dilantin [phenytoin sodium extended]; Penicillins; and Tegretol [carbamazepine]  History reviewed. No pertinent family history.  Social History Social History  Substance Use Topics  . Smoking status: Former Smoker    Packs/day: 0.50    Types: Cigarettes    Quit date: 01/12/2015  . Smokeless tobacco: Never Used  . Alcohol use No    Review of Systems Constitutional: No fever/chills Eyes: No visual changes. ENT: No sore throat. Cardiovascular: Denies chest pain. Respiratory: Denies shortness of breath. Gastrointestinal: No abdominal pain.  No nausea, no vomiting.  No diarrhea.  No constipation. Genitourinary: Negative for dysuria. Musculoskeletal: Negative for back pain. Skin: Negative for rash. Neurological: Negative for headaches, focal weakness or numbness.  10-point ROS otherwise negative.  ____________________________________________   PHYSICAL EXAM:  VITAL SIGNS: ED Triage Vitals  Enc Vitals Group     BP 03/17/16 0625 (!) 130/92     Pulse Rate 03/17/16 0625 78     Resp 03/17/16 0625 18     Temp 03/17/16 0625 98 F (36.7 C)     Temp Source 03/17/16 0625 Oral  SpO2 03/17/16 0625 97 %     Weight 03/17/16 0626 210 lb (95.3 kg)     Height 03/17/16 0626 5\' 11"  (1.803 m)     Head Circumference --      Peak Flow --      Pain Score 03/17/16 0626 10     Pain Loc --      Pain Edu? --      Excl. in [place]? --    Constitutional: Alert and oriented. Well appearing and in no acute distress. Eyes: Conjunctivae are normal. PERRL. EOMI. Ears: Left ear not examined right TM is clear. Canal is nonswollen.  Head: Atraumatic. He does have swelling in the right side of the maxillary area up to the eye. There  is no erythema but the area is very tender. Nose: No congestion/rhinnorhea. Mouth/Throat: Mucous membranes are moist.  Oropharynx non-erythematous. Neck: No stridor. Cardiovascular: Normal rate, regular rhythm. Grossly normal heart sounds.  Good peripheral circulation. Respiratory: Normal respiratory effort.  No retractions. Lungs CTAB. Gastrointestinal: Soft and nontender. No distention. No abdominal bruits. No CVA tenderness. Musculoskeletal: No lower extremity tenderness nor edema.  No joint effusions. Neurologic:  Normal speech and language. No gross focal neurologic deficits are appreciated. No gait instability. Skin:  Skin is warm, dry and intact. No rash noted. Psychiatric: Mood and affect are normal. Speech and behavior are normal.  ____________________________________________   LABS (all labs ordered are listed, but only abnormal results are displayed)  Labs Reviewed  BASIC METABOLIC PANEL - Abnormal; Notable for the following:       Result Value   CO2 19 (*)    Glucose, Bld 106 (*)    All other components within normal limits  CBC WITH DIFFERENTIAL/PLATELET - Abnormal; Notable for the following:    WBC 14.7 (*)    RDW 14.6 (*)    Neutro Abs 11.6 (*)    All other components within normal limits   ____________________________________________  EKG   ____________________________________________  RADIOLOGY  EXAM: CT MAXILLOFACIAL WITH CONTRAST  TECHNIQUE: Multidetector CT imaging of the maxillofacial structures was performed with intravenous contrast. Multiplanar CT image reconstructions were also generated. A small metallic BB was placed on the right temple in order to reliably differentiate right from left.  CONTRAST:  27mL ISOVUE-300 IOPAMIDOL (ISOVUE-300) INJECTION 61%  COMPARISON:  None.  FINDINGS: There is pronounced soft tissue swelling of the subcutaneous tissues of the right face consistent with superficial cellulitis. No evidence of  nonenhancing collection to suggest drainable abscess at this time. No evidence of deep space extension.  The patient has previous wisdom tooth extraction. There is dental decay and a root abscess at tooth 4. There is decay and root abscess at tooth 6. These are probably responsible for the acute presentation. There is a supernumerary unerupted tooth in the right maxilla with in her cortical breakthrough.  Maxillary dentition also shows considerable disease. There is a supernumerary uninterrupted tooth in the right maxilla. There is a supernumerary unerupted tooth in the left mandible with dentigerous cyst formation. There is extensive decay of teeth 18 and 19.  Mild mucosal thickening of the inferior right maxillary sinus. No evidence of acute sinusitis. Intracranial structures appear unremarkable.  IMPRESSION: Superficial cellulitis of the right face secondary to dental and periodontal disease. No evidence of drainable abscess at this time or deep space extension.  Dental decay and root abscess at tooth 4.  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 Dentigerous cyst associated with the left mandibular instance.   Electronically Signed   By: Nelson Chimes M.D.   On: 03/17/2016 09:13  ____________________________________________   PROCEDURES  Procedure(s) performed:  Procedures  Critical Care performed:   ____________________________________________   INITIAL IMPRESSION / ASSESSMENT AND PLAN / ED COURSE  Pertinent labs & imaging results that were available during my care of the patient were reviewed by me and considered in my medical decision making (see chart for details).   Clinical Course     ____________________________________________   FINAL CLINICAL IMPRESSION(S) / ED DIAGNOSES  Final diagnoses:  Dental abscess  Facial swelling      NEW MEDICATIONS  STARTED DURING THIS VISIT:  Discharge Medication List as of 03/17/2016  9:49 AM    START taking these medications   Details  clindamycin (CLEOCIN) 300 MG capsule Take 1 capsule (300 mg total) by mouth 3 (three) times daily., Starting Sun 03/17/2016, Until Wed 03/27/2016, Print    oxyCODONE-acetaminophen (ROXICET) 5-325 MG tablet Take 1 tablet by mouth every 6 (six) hours as needed., Starting Sun 03/17/2016, Until Mon 03/17/2017, Print         Note:  This document was prepared using Dragon voice recognition software and may include unintentional dictation errors.    Nena Polio, MD 03/17/16 1515

## 2016-03-17 NOTE — ED Triage Notes (Signed)
Pt reports toothache to top right side of his mouth x 2 days; woke today with swelling to right side of his face, extending up into eyelids; pt says he thinks he has a broken tooth;

## 2016-03-17 NOTE — ED Notes (Signed)
Pt stated the tooth started bothering him a few days ago. The  facial swelling started today and the pain has gotten worse .Afraid he will have a seizure if he doesn't control the pain.

## 2016-03-17 NOTE — Discharge Instructions (Addendum)
Take the clindamycin 1 pill 3 x a day. Take the percocet 1-2 pills 4 x a day for pain. Be careful the percocet can make you sleepy. Do not drive on it. The percocet can also make you constipated. Call one of the dentists on the list I gave you and set up an appointment. They may not want to see you till the swelling goes down.   The sure to return if the swelling or pain gets worse or if you get a fever or she develop any other problems.

## 2016-04-23 ENCOUNTER — Encounter: Payer: Self-pay | Admitting: *Deleted

## 2016-04-23 ENCOUNTER — Emergency Department
Admission: EM | Admit: 2016-04-23 | Discharge: 2016-04-23 | Disposition: A | Discharge status: Home / Self Care | Payer: Self-pay | Attending: Emergency Medicine | Admitting: Emergency Medicine

## 2016-04-23 DIAGNOSIS — Z8547 Personal history of malignant neoplasm of testis: Secondary | ICD-10-CM | POA: Insufficient documentation

## 2016-04-23 DIAGNOSIS — G40909 Epilepsy, unspecified, not intractable, without status epilepticus: Secondary | ICD-10-CM | POA: Insufficient documentation

## 2016-04-23 DIAGNOSIS — Z87891 Personal history of nicotine dependence: Secondary | ICD-10-CM | POA: Insufficient documentation

## 2016-04-23 DIAGNOSIS — Z791 Long term (current) use of non-steroidal anti-inflammatories (NSAID): Secondary | ICD-10-CM | POA: Insufficient documentation

## 2016-04-23 DIAGNOSIS — J45909 Unspecified asthma, uncomplicated: Secondary | ICD-10-CM | POA: Insufficient documentation

## 2016-04-23 DIAGNOSIS — Z7952 Long term (current) use of systemic steroids: Secondary | ICD-10-CM | POA: Insufficient documentation

## 2016-04-23 DIAGNOSIS — R569 Unspecified convulsions: Secondary | ICD-10-CM

## 2016-04-23 LAB — CBC
HCT: 53.2 % — ABNORMAL HIGH (ref 40.0–52.0)
HEMOGLOBIN: 17.8 g/dL (ref 13.0–18.0)
MCH: 30.1 pg (ref 26.0–34.0)
MCHC: 33.4 g/dL (ref 32.0–36.0)
MCV: 89.9 fL (ref 80.0–100.0)
Platelets: 185 10*3/uL (ref 150–440)
RBC: 5.92 MIL/uL — AB (ref 4.40–5.90)
RDW: 14.6 % — ABNORMAL HIGH (ref 11.5–14.5)
WBC: 7.3 10*3/uL (ref 3.8–10.6)

## 2016-04-23 LAB — URINALYSIS COMPLETE WITH MICROSCOPIC (ARMC ONLY)
Bacteria, UA: NONE SEEN
Bilirubin Urine: NEGATIVE
Glucose, UA: NEGATIVE mg/dL
Ketones, ur: NEGATIVE mg/dL
LEUKOCYTES UA: NEGATIVE
Nitrite: NEGATIVE
PH: 6 (ref 5.0–8.0)
PROTEIN: NEGATIVE mg/dL
SPECIFIC GRAVITY, URINE: 1.017 (ref 1.005–1.030)

## 2016-04-23 LAB — BASIC METABOLIC PANEL
ANION GAP: 7 (ref 5–15)
BUN: 14 mg/dL (ref 6–20)
CHLORIDE: 106 mmol/L (ref 101–111)
CO2: 25 mmol/L (ref 22–32)
Calcium: 9.7 mg/dL (ref 8.9–10.3)
Creatinine, Ser: 1.26 mg/dL — ABNORMAL HIGH (ref 0.61–1.24)
GFR calc Af Amer: >60 mL/min (ref >60)
GFR calc non Af Amer: >60 mL/min (ref >60)
GLUCOSE: 79 mg/dL (ref 65–99)
POTASSIUM: 4.3 mmol/L (ref 3.5–5.1)
Sodium: 138 mmol/L (ref 135–145)

## 2016-04-23 MED ORDER — LEVETIRACETAM 1000 MG PO TABS: 1000.0000 mg | ORAL_TABLET | Freq: Three times a day (TID) | ORAL | 2 refills | Status: DC

## 2016-04-23 MED ORDER — ALBUTEROL SULFATE HFA 108 (90 BASE) MCG/ACT IN AERS: 2.0000 | INHALATION_SPRAY | Freq: Four times a day (QID) | RESPIRATORY_TRACT | 2 refills | Status: DC | PRN

## 2016-04-23 MED ORDER — IPRATROPIUM-ALBUTEROL 0.5-2.5 (3) MG/3ML IN SOLN
3.0000 mL | Freq: Once | RESPIRATORY_TRACT | Status: AC
  Administered 2016-04-23: 3 mL via RESPIRATORY_TRACT
  Filled 2016-04-23: qty 3

## 2016-04-23 MED ORDER — LEVETIRACETAM 500 MG PO TABS
1500.0000 mg | ORAL_TABLET | Freq: Once | ORAL | Status: AC
  Administered 2016-04-23: 1500 mg via ORAL
  Filled 2016-04-23: qty 3

## 2016-04-23 NOTE — ED Triage Notes (Signed)
Pt to triage via wheelchair  Pt reports he had a seizure 1 hour ago.  Hx epilepsy.  Pt takes keppra.  Last seizure was 1 month ago.  Pt reports a headache.  Pt alert.  Speech clear.

## 2016-04-23 NOTE — ED Provider Notes (Signed)
[facility] Emergency Department Provider Note  Time seen: 7:58 PM  I have reviewed the triage vital signs and the nursing notes.   HISTORY  Chief Complaint Seizures    HPI Shane Leach is a 30 y.o. male with a past medical history of epilepsy presents the emergency department for seizure. According to the patient's wife he was lying in bed when he began foaming at the mouth and was unresponsive for approximately 1 minute. Consistent with his typical seizure. Patient states he ran out of Keppra several days ago. Denies any alcohol or drug use. Patient denies any falls or head injury. Patient states he is feeling well currently.  Past Medical History:  Diagnosis Date  . Asthma   . Bronchitis   . Epilepsy ([place])   . Seizures ([place])     There are no active problems to display for this patient.   Past Surgical History:  Procedure Laterality Date  . testicular cancer     right testicle removed    Prior to Admission medications   Medication Sig Start Date End Date Taking? Authorizing Provider  albuterol (PROVENTIL HFA;VENTOLIN HFA) 108 (90 Base) MCG/ACT inhaler Inhale 2 puffs into the lungs every 6 (six) hours as needed for wheezing or shortness of breath. 08/28/15   Daymon Larsen, MD  azithromycin (ZITHROMAX Z-PAK) 250 MG tablet Take 2 tablets (500 mg) on  Day 1,  followed by 1 tablet (250 mg) once daily on Days 2 through 5. 08/28/15   Daymon Larsen, MD  diazepam (VALIUM) 5 MG tablet Take 1 tablet (5 mg total) by mouth every 8 (eight) hours as needed for muscle spasms. 02/25/16   Earleen Newport, MD  ibuprofen (ADVIL,MOTRIN) 800 MG tablet Take 1 tablet (800 mg total) by mouth every 8 (eight) hours as needed. 02/25/16   Earleen Newport, MD  levETIRAcetam (KEPPRA) 500 MG tablet Take 2 tablets (1,000 mg total) by mouth 2 (two) times daily. 10/25/14   Nance Pear, MD  oxyCODONE-acetaminophen (ROXICET) 5-325 MG tablet Take 1 tablet by mouth every 6  (six) hours as needed. 03/17/16 03/17/17  Nena Polio, MD  predniSONE (DELTASONE) 20 MG tablet Take 2 tablets (40 mg total) by mouth daily. 08/29/15 09/02/16  Daymon Larsen, MD    Allergies  Allergen Reactions  . Dilantin [Phenytoin Sodium Extended] Other (See Comments)  . Penicillins Other (See Comments)  . Tegretol [Carbamazepine] Other (See Comments)    No family history on file.  Social History Social History  Substance Use Topics  . Smoking status: Former Smoker    Packs/day: 0.50    Types: Cigarettes    Quit date: 01/12/2015  . Smokeless tobacco: Never Used  . Alcohol use No    Review of Systems Constitutional: Negative for fever. Cardiovascular: Negative for chest pain. Respiratory: Negative for shortness of breath. Gastrointestinal: Negative for abdominal pain Musculoskeletal: Negative for back pain. Neurological: Negative for headaches, focal weakness or numbness. 10-point ROS otherwise negative.  ____________________________________________   PHYSICAL EXAM:  VITAL SIGNS: ED Triage Vitals  Enc Vitals Group     BP 04/23/16 1950 135/62     Pulse Rate 04/23/16 1950 70     Resp 04/23/16 1950 20     Temp 04/23/16 1950 98 F (36.7 C)     Temp Source 04/23/16 1950 Oral     SpO2 04/23/16 1950 100 %     Weight 04/23/16 1951 200 lb (90.7 kg)     Height  04/23/16 1951 6' (1.829 m)     Head Circumference --      Peak Flow --      Pain Score 04/23/16 1951 10     Pain Loc --      Pain Edu? --      Excl. in [place]? --    Constitutional: Alert and oriented. Well appearing and in no distress. Eyes: Normal exam ENT   Head: Normocephalic and atraumatic.   Mouth/Throat: Mucous membranes are moist. Cardiovascular: Normal rate, regular rhythm. No murmur Respiratory: Normal respiratory effort without tachypnea nor retractions. Mild expiratory wheeze bilaterally. Gastrointestinal: Soft and nontender. No distention. Musculoskeletal: Nontender with normal range of  motion in all extremities.  Neurologic:  Normal speech and language. No gross focal neurologic deficits Skin:  Skin is warm, dry and intact.  Psychiatric: Mood and affect are normal.  ____________________________________________    INITIAL IMPRESSION / ASSESSMENT AND PLAN / ED COURSE  Pertinent labs & imaging results that were available during my care of the patient were reviewed by me and considered in my medical decision making (see chart for details).  Patient presented to [facility] for a seizure. Patient has underlying seizure disorder taking Keppra but has not been taking it for the past several days as ran out. Patient does have mild expiratory wheeze on exam. We'll treat with a DuoNeb. Check basic labs, loaded with oral Keppra and closely monitor. If labs are within normal limits patient will be discharged home with prescriptions for Keppra as well as an albuterol inhaler. Patient states he is supposed to be using an albuterol inhaler but ran out of that medication as well.  Patient's labs are within normal limits. Patient has been loaded with oral Keppra. We'll discharge home with Keppra and albuterol inhaler. Patient is agreeable to plan.  ____________________________________________   FINAL CLINICAL IMPRESSION(S) / ED DIAGNOSES  Seizure    Harvest Dark, MD 04/23/16 2048

## 2016-05-15 ENCOUNTER — Emergency Department
Admission: EM | Admit: 2016-05-15 | Discharge: 2016-05-15 | Disposition: A | Discharge status: Home / Self Care | Payer: Self-pay | Attending: Emergency Medicine | Admitting: Emergency Medicine

## 2016-05-15 ENCOUNTER — Encounter: Payer: Self-pay | Admitting: Emergency Medicine

## 2016-05-15 DIAGNOSIS — Z91199 Patient's noncompliance with other medical treatment and regimen due to unspecified reason: Secondary | ICD-10-CM

## 2016-05-15 DIAGNOSIS — J45909 Unspecified asthma, uncomplicated: Secondary | ICD-10-CM | POA: Insufficient documentation

## 2016-05-15 DIAGNOSIS — G40909 Epilepsy, unspecified, not intractable, without status epilepticus: Secondary | ICD-10-CM | POA: Insufficient documentation

## 2016-05-15 DIAGNOSIS — R569 Unspecified convulsions: Secondary | ICD-10-CM

## 2016-05-15 DIAGNOSIS — Z9119 Patient's noncompliance with other medical treatment and regimen: Secondary | ICD-10-CM | POA: Insufficient documentation

## 2016-05-15 DIAGNOSIS — Z79899 Other long term (current) drug therapy: Secondary | ICD-10-CM | POA: Insufficient documentation

## 2016-05-15 DIAGNOSIS — Z87891 Personal history of nicotine dependence: Secondary | ICD-10-CM | POA: Insufficient documentation

## 2016-05-15 MED ORDER — SODIUM CHLORIDE 0.9 % IV SOLN
1000.0000 mg | Freq: Once | INTRAVENOUS | Status: AC
  Administered 2016-05-15: 1000 mg via INTRAVENOUS
  Filled 2016-05-15: qty 10

## 2016-05-15 MED ORDER — LEVETIRACETAM 1000 MG PO TABS: 1000.0000 mg | ORAL_TABLET | Freq: Three times a day (TID) | ORAL | 1 refills | Status: DC

## 2016-05-15 MED ORDER — OXYCODONE-ACETAMINOPHEN 5-325 MG PO TABS
1.0000 | ORAL_TABLET | Freq: Once | ORAL | Status: AC
  Administered 2016-05-15: 1 via ORAL
  Filled 2016-05-15: qty 1

## 2016-05-15 MED ORDER — CEPHALEXIN 500 MG PO CAPS: 1000.0000 mg | ORAL_CAPSULE | Freq: Once | ORAL | Status: DC

## 2016-05-15 NOTE — ED Provider Notes (Addendum)
[facility] Emergency Department Provider Note  ____________________________________________   I have reviewed the triage vital signs and the nursing notes.   HISTORY  Chief Complaint Seizures    HPI Shane Leach is a 30 y.o. male with a history of seizures on Keppra, has not been compliant with his medication. Last took his Keppra yesterday morning. Did not run out. Had this witnessed seizure in bed. Did not fall out of bed but did "go into my arms" according to family member with him. At this time he states his whole body is sore "like it always is after her seizure". Does not feel that he suffered any traumatic injury. He would like something for the pain. He is waking up well.       Past Medical History:  Diagnosis Date  . Asthma   . Bronchitis   . Epilepsy ([place])   . Seizures ([place])     There are no active problems to display for this patient.   Past Surgical History:  Procedure Laterality Date  . testicular cancer     right testicle removed    Prior to Admission medications   Medication Sig Start Date End Date Taking? Authorizing Provider  albuterol (PROVENTIL HFA;VENTOLIN HFA) 108 (90 Base) MCG/ACT inhaler Inhale 2 puffs into the lungs every 6 (six) hours as needed for wheezing or shortness of breath. 04/23/16   Harvest Dark, MD  azithromycin (ZITHROMAX Z-PAK) 250 MG tablet Take 2 tablets (500 mg) on  Day 1,  followed by 1 tablet (250 mg) once daily on Days 2 through 5. 08/28/15   Daymon Larsen, MD  diazepam (VALIUM) 5 MG tablet Take 1 tablet (5 mg total) by mouth every 8 (eight) hours as needed for muscle spasms. 02/25/16   Earleen Newport, MD  ibuprofen (ADVIL,MOTRIN) 800 MG tablet Take 1 tablet (800 mg total) by mouth every 8 (eight) hours as needed. 02/25/16   Earleen Newport, MD  levETIRAcetam (KEPPRA) 1000 MG tablet Take 1 tablet (1,000 mg total) by mouth 3 (three) times daily. 04/23/16   Harvest Dark, MD   oxyCODONE-acetaminophen (ROXICET) 5-325 MG tablet Take 1 tablet by mouth every 6 (six) hours as needed. 03/17/16 03/17/17  Nena Polio, MD  predniSONE (DELTASONE) 20 MG tablet Take 2 tablets (40 mg total) by mouth daily. 08/29/15 09/02/16  Daymon Larsen, MD    Allergies Dilantin [phenytoin sodium extended]; Penicillins; and Tegretol [carbamazepine]  History reviewed. No pertinent family history.  Social History Social History  Substance Use Topics  . Smoking status: Former Smoker    Packs/day: 0.50    Types: Cigarettes    Quit date: 01/12/2015  . Smokeless tobacco: Never Used  . Alcohol use No    Review of Systems Constitutional: No fever/chills Eyes: No visual changes. ENT: No sore throat. No stiff neck no neck pain Cardiovascular: Denies chest pain. Respiratory: Denies shortness of breath. Gastrointestinal:   no vomiting.  No diarrhea.  No constipation. Genitourinary: Negative for dysuria. Musculoskeletal: Negative lower extremity swelling Skin: Negative for rash. Neurological: Negative for severe headaches, focal weakness or numbness. 10-point ROS otherwise negative.  ____________________________________________   PHYSICAL EXAM:  VITAL SIGNS: ED Triage Vitals [05/15/16 1218]  Enc Vitals Group     BP (!) 143/93     Pulse Rate (!) 57     Resp 20     Temp 98 F (36.7 C)     Temp Source Oral     SpO2 100 %  Weight 200 lb (90.7 kg)     Height      Head Circumference      Peak Flow      Pain Score 7     Pain Loc      Pain Edu?      Excl. in [place]?     Constitutional: Alert and oriented. Well appearing and in no acute distress. Eyes: Conjunctivae are normal. PERRL. EOMI. Head: Atraumatic. Nose: No congestion/rhinnorhea. Mouth/Throat: Mucous membranes are moist.  Oropharynx non-erythematous. Neck: No stridor.   Nontender with no meningismus Cardiovascular: Normal rate, regular rhythm. Grossly normal heart sounds.  Good peripheral circulation. Respiratory:  Normal respiratory effort.  No retractions. Lungs CTAB. Abdominal: Soft and nontender. No distention. No guarding no rebound Back:  There is no focal tenderness or step off.  there is no midline tenderness there are no lesions noted. there is no CVA tenderness  Musculoskeletal: No lower extremity tenderness, no upper extremity tenderness. No joint effusions, no DVT signs strong distal pulses no edema Neurologic:  Normal speech and language. No gross focal neurologic deficits are appreciated.  Skin:  Skin is warm, dry and intact. No rash noted. Psychiatric: Mood and affect are normal. Speech and behavior are normal.  ____________________________________________   LABS (all labs ordered are listed, but only abnormal results are displayed)  Labs Reviewed - No data to display ____________________________________________  EKG  I personally interpreted any EKGs ordered by me or triage  ____________________________________________  RADIOLOGY  I reviewed any imaging ordered by me or triage that were performed during my shift and, if possible, patient and/or family made aware of any abnormal findings. ____________________________________________   PROCEDURES  Procedure(s) performed: None  Procedures  Critical Care performed: None  ____________________________________________   INITIAL IMPRESSION / ASSESSMENT AND PLAN / ED COURSE  Pertinent labs & imaging results that were available during my care of the patient were reviewed by me and considered in my medical decision making (see chart for details).  Patient with recurrent seizures here with a seizure after noncompliance we'll load him with Keppra and watch him carefully. At this time I see no obvious injury requiring imaging but we'll reassess.  ----------------------------------------- 1:42 PM on 05/15/2016 -----------------------------------------  On tertiary survey, patient is no evidence of acute traumatic injury. I  have advised him not to drive, operate forklifts or heavy machinery, or do anything that, if interrupted by a seizure, could cause him harm and he understands this. He is neurologically intact at discharge, the patient is required to come back if he starts to feel that we miss something in terms of injury because "his whole body hurts" but I cannot find any acute pathology to x-ray and he feels nothing is broken. Patient states he actually lost his last prescription which was thousand 3 times a day of Keppra so we will write him another prescription but I have strongly advised the need to follow-up as an outpatient. Return precautions and follow-up given and understood.  Clinical Course    ____________________________________________   FINAL CLINICAL IMPRESSION(S) / ED DIAGNOSES  Final diagnoses:  None      This chart was dictated using voice recognition software.  Despite best efforts to proofread,  errors can occur which can change meaning.      Schuyler Amor, MD 05/15/16 [place], MD 05/15/16 [place], MD 05/15/16 [phone]

## 2016-05-15 NOTE — ED Triage Notes (Signed)
Pt to ed via POV, per family with pt pt had a seizuer this am while sleeping.  Per family pt has been taking his meds regularly x 2 weeks however he did not take meds last night.  Reports recently had 2 episodes of seizures.  Pt alert and oriented at triage.  Pt reports headache at this time.

## 2016-05-15 NOTE — Discharge Instructions (Signed)
Take your medication as prescribed. Do not drive, soak in the tub, operate heavy material, or climb ladders or do anything that, if interrupted by a seizure, would cause you to be vulnerable to injury.

## 2016-06-02 ENCOUNTER — Encounter: Payer: Self-pay | Admitting: Intensive Care

## 2016-06-02 ENCOUNTER — Emergency Department
Admission: EM | Admit: 2016-06-02 | Discharge: 2016-06-02 | Disposition: A | Discharge status: Home / Self Care | Payer: Self-pay | Attending: Emergency Medicine | Admitting: Emergency Medicine

## 2016-06-02 DIAGNOSIS — K047 Periapical abscess without sinus: Secondary | ICD-10-CM | POA: Insufficient documentation

## 2016-06-02 DIAGNOSIS — Z79899 Other long term (current) drug therapy: Secondary | ICD-10-CM | POA: Insufficient documentation

## 2016-06-02 DIAGNOSIS — J45909 Unspecified asthma, uncomplicated: Secondary | ICD-10-CM | POA: Insufficient documentation

## 2016-06-02 DIAGNOSIS — K0889 Other specified disorders of teeth and supporting structures: Secondary | ICD-10-CM

## 2016-06-02 DIAGNOSIS — F17210 Nicotine dependence, cigarettes, uncomplicated: Secondary | ICD-10-CM | POA: Insufficient documentation

## 2016-06-02 MED ORDER — LIDOCAINE VISCOUS 2 % MT SOLN
15.0000 mL | Freq: Once | OROMUCOSAL | Status: AC
  Administered 2016-06-02: 15 mL via OROMUCOSAL
  Filled 2016-06-02: qty 15

## 2016-06-02 MED ORDER — IBUPROFEN 800 MG PO TABS
800.0000 mg | ORAL_TABLET | Freq: Once | ORAL | Status: AC
  Administered 2016-06-02: 800 mg via ORAL
  Filled 2016-06-02: qty 1

## 2016-06-02 MED ORDER — CLINDAMYCIN HCL 300 MG PO CAPS: 300.0000 mg | ORAL_CAPSULE | Freq: Three times a day (TID) | ORAL | 0 refills | Status: DC

## 2016-06-02 MED ORDER — LIDOCAINE VISCOUS 2 % MT SOLN: 10.0000 mL | OROMUCOSAL | 0 refills | Status: DC | PRN

## 2016-06-02 MED ORDER — IBUPROFEN 800 MG PO TABS: 800.0000 mg | ORAL_TABLET | Freq: Three times a day (TID) | ORAL | 0 refills | Status: DC | PRN

## 2016-06-02 NOTE — ED Triage Notes (Addendum)
Patient presents to ER from home for Upper R sided dental pain starting yesterday that is recurrent. Patient is A&O x4. Patient has HX of seizures and compliant with medicine. Last seizure X2 weeks ago. Denies any recent dental work. Denies taking any OTC medications at home for pain

## 2016-06-02 NOTE — ED Provider Notes (Signed)
[facility] Emergency Department Provider Note  ____________________________________________  Time seen: Approximately 1:25 PM  I have reviewed the triage vital signs and the nursing notes.   HISTORY  Chief Complaint Dental Pain    HPI Shane Leach is a 30 y.o. male , NAD, presents to the emergency department with 2 day history of right upper dental pain and swelling. Has not taken anything over-the-counter for his pain. States he has chronic issues with his right upper posterior teeth. Has been treated for dental infections in this area multiple times. Last infection was approximately one month ago in which she was given clindamycin and tolerated well with good results. States he does not have dental insurance therefore has not seen a dentist for evaluation. Denies any fevers, chills or body aches. Has not had any swelling, redness or abnormal warmth about the face. Denies any swelling of the lip/tongue/throat. Has been able to swallow without any difficulty. Does note that he is epileptic and has been taking his medications on a daily basis without skipping doses. Denies any recent seizure activity.    Past Medical History:  Diagnosis Date  . Asthma   . Bronchitis   . Epilepsy ([place])   . Seizures ([place])     There are no active problems to display for this patient.   Past Surgical History:  Procedure Laterality Date  . testicular cancer     right testicle removed    Prior to Admission medications   Medication Sig Start Date End Date Taking? Authorizing Provider  albuterol (PROVENTIL HFA;VENTOLIN HFA) 108 (90 Base) MCG/ACT inhaler Inhale 2 puffs into the lungs every 6 (six) hours as needed for wheezing or shortness of breath. 04/23/16   Harvest Dark, MD  azithromycin (ZITHROMAX Z-PAK) 250 MG tablet Take 2 tablets (500 mg) on  Day 1,  followed by 1 tablet (250 mg) once daily on Days 2 through 5. 08/28/15   Daymon Larsen, MD  clindamycin  (CLEOCIN) 300 MG capsule Take 1 capsule (300 mg total) by mouth 3 (three) times daily. 06/02/16   Ruxin Ransome L Enrrique Mierzwa, PA-C  diazepam (VALIUM) 5 MG tablet Take 1 tablet (5 mg total) by mouth every 8 (eight) hours as needed for muscle spasms. 02/25/16   Earleen Newport, MD  ibuprofen (ADVIL,MOTRIN) 800 MG tablet Take 1 tablet (800 mg total) by mouth every 8 (eight) hours as needed (pain). 06/02/16   Kelse Ploch L Morelia Cassells, PA-C  levETIRAcetam (KEPPRA) 1000 MG tablet Take 1 tablet (1,000 mg total) by mouth 3 (three) times daily. 05/15/16   Schuyler Amor, MD  lidocaine (XYLOCAINE) 2 % solution Use as directed 10 mLs in the mouth or throat every 4 (four) hours as needed for mouth pain. 06/02/16   Auden Wettstein L Minsa Weddington, PA-C  oxyCODONE-acetaminophen (ROXICET) 5-325 MG tablet Take 1 tablet by mouth every 6 (six) hours as needed. 03/17/16 03/17/17  Nena Polio, MD  predniSONE (DELTASONE) 20 MG tablet Take 2 tablets (40 mg total) by mouth daily. 08/29/15 09/02/16  Daymon Larsen, MD    Allergies Dilantin [phenytoin sodium extended]; Penicillins; and Tegretol [carbamazepine]  History reviewed. No pertinent family history.  Social History Social History  Substance Use Topics  . Smoking status: Current Every Day Smoker    Packs/day: 0.50    Types: Cigarettes    Last attempt to quit: 01/12/2015  . Smokeless tobacco: Never Used  . Alcohol use No     Review of Systems  Constitutional: No fever/chills ENT:  Positive right upper dental pain. Negative for swelling about the lip/tongue/throat. Cardiovascular: No chest pain. Respiratory: No shortness of breath.  Gastrointestinal: No abdominal pain.  No nausea, vomiting.   Musculoskeletal: Negative for jaw pain.  Skin: Negative for rash, redness, swelling, abnormal warmth  ____________________________________________   PHYSICAL EXAM:  VITAL SIGNS: ED Triage Vitals [06/02/16 1234]  Enc Vitals Group     BP (!) 137/112     Pulse Rate 69     Resp 20     Temp 98 F  (36.7 C)     Temp Source Oral     SpO2 95 %     Weight 200 lb (90.7 kg)     Height      Head Circumference      Peak Flow      Pain Score 10     Pain Loc      Pain Edu?      Excl. in [place]?      Constitutional: Alert and oriented. Well appearing and in no acute distress. Eyes: Conjunctivae are normal Without icterus, injection or discharge Head: Atraumatic. ENT:      Nose: No congestion/rhinnorhea.      Mouth/Throat: Mucous membranes are moist. Mild swelling noted about the right upper posterior gum line with tenderness to palpation. No oozing, weeping. No significant erythema. Multiple cavities are noted. Neck: No stridor. Supple with full range of motion. Hematological/Lymphatic/Immunilogical: No cervical lymphadenopathy. Cardiovascular: Normal rate, regular rhythm. Normal S1 and S2.  Good peripheral circulation. Respiratory: Normal respiratory effort without tachypnea or retractions. Lungs CTAB With breath sounds noted in all lung fields. No wheeze, rhonchi, rales Musculoskeletal: No Tenderness to palpation about the jawline or TMJ. Neurologic:  Normal speech and language. No gross focal neurologic deficits are appreciated.  Skin:  Skin is warm, dry and intact. No rash noted. Psychiatric: Mood and affect are normal. Speech and behavior are normal. Patient exhibits appropriate insight and judgement.   ____________________________________________   LABS  None ____________________________________________  EKG  None ____________________________________________  RADIOLOGY  None ____________________________________________    PROCEDURES  Procedure(s) performed: None   Procedures   Medications  ibuprofen (ADVIL,MOTRIN) tablet 800 mg (800 mg Oral Given 06/02/16 1342)  lidocaine (XYLOCAINE) 2 % viscous mouth solution 15 mL (15 mLs Mouth/Throat Given 06/02/16 1342)     ____________________________________________   INITIAL IMPRESSION / ASSESSMENT AND PLAN / ED  COURSE  Pertinent labs & imaging results that were available during my care of the patient were reviewed by me and considered in my medical decision making (see chart for details).  Clinical Course     Patient's diagnosis is consistent with Dental abscess causing dental pain. Patient was given ibuprofen and lidocaine viscous while in the emergency department and tolerated well without side effects. Patient will be discharged home with prescriptions for clindamycin, lidocaine viscous and ibuprofen to take as directed. Patient is to follow up with the [facility] or [facility] for further evaluation and treatment. Patient is given ED precautions to return to the ED for any worsening or new symptoms.   ____________________________________________  FINAL CLINICAL IMPRESSION(S) / ED DIAGNOSES  Final diagnoses:  Dental abscess  Pain, dental      NEW MEDICATIONS STARTED DURING THIS VISIT:  Discharge Medication List as of 06/02/2016  1:30 PM    START taking these medications   Details  clindamycin (CLEOCIN) 300 MG capsule Take 1 capsule (300 mg total) by mouth 3 (three) times daily., Starting  Sun 06/02/2016, Print    lidocaine (XYLOCAINE) 2 % solution Use as directed 10 mLs in the mouth or throat every 4 (four) hours as needed for mouth pain., Starting Sun 06/02/2016, [place], PA-C 06/02/16 [place], MD 06/06/16 [phone]

## 2016-06-02 NOTE — ED Notes (Signed)
Pt verbalized understanding of discharge instructions. NAD at this time. 

## 2016-06-02 NOTE — Discharge Instructions (Signed)
May also follow up with the [facility]

## 2016-11-15 ENCOUNTER — Emergency Department
Admission: EM | Admit: 2016-11-15 | Discharge: 2016-11-15 | Disposition: A | Discharge status: Home / Self Care | Payer: Self-pay | Attending: Emergency Medicine | Admitting: Emergency Medicine

## 2016-11-15 ENCOUNTER — Encounter: Payer: Self-pay | Admitting: Emergency Medicine

## 2016-11-15 ENCOUNTER — Emergency Department: Payer: Self-pay

## 2016-11-15 DIAGNOSIS — J209 Acute bronchitis, unspecified: Secondary | ICD-10-CM | POA: Insufficient documentation

## 2016-11-15 DIAGNOSIS — K029 Dental caries, unspecified: Secondary | ICD-10-CM | POA: Insufficient documentation

## 2016-11-15 DIAGNOSIS — Z8547 Personal history of malignant neoplasm of testis: Secondary | ICD-10-CM | POA: Insufficient documentation

## 2016-11-15 DIAGNOSIS — J45901 Unspecified asthma with (acute) exacerbation: Secondary | ICD-10-CM | POA: Insufficient documentation

## 2016-11-15 DIAGNOSIS — F17210 Nicotine dependence, cigarettes, uncomplicated: Secondary | ICD-10-CM | POA: Insufficient documentation

## 2016-11-15 DIAGNOSIS — Z79899 Other long term (current) drug therapy: Secondary | ICD-10-CM | POA: Insufficient documentation

## 2016-11-15 MED ORDER — ALBUTEROL SULFATE HFA 108 (90 BASE) MCG/ACT IN AERS: 2.0000 | INHALATION_SPRAY | Freq: Four times a day (QID) | RESPIRATORY_TRACT | 2 refills | Status: DC | PRN

## 2016-11-15 MED ORDER — IPRATROPIUM-ALBUTEROL 0.5-2.5 (3) MG/3ML IN SOLN
3.0000 mL | Freq: Once | RESPIRATORY_TRACT | Status: AC
  Administered 2016-11-15: 3 mL via RESPIRATORY_TRACT
  Filled 2016-11-15: qty 3

## 2016-11-15 MED ORDER — AMOXICILLIN 500 MG PO CAPS: 500.0000 mg | ORAL_CAPSULE | Freq: Three times a day (TID) | ORAL | 0 refills | Status: DC

## 2016-11-15 MED ORDER — PREDNISONE 20 MG PO TABS
60.0000 mg | ORAL_TABLET | Freq: Once | ORAL | Status: AC
  Administered 2016-11-15: 60 mg via ORAL
  Filled 2016-11-15: qty 3

## 2016-11-15 MED ORDER — IPRATROPIUM-ALBUTEROL 0.5-2.5 (3) MG/3ML IN SOLN
3.0000 mL | Freq: Once | RESPIRATORY_TRACT | Status: AC
  Administered 2016-11-15: 3 mL via RESPIRATORY_TRACT

## 2016-11-15 MED ORDER — PREDNISONE 10 MG PO TABS: ORAL_TABLET | ORAL | 0 refills | Status: DC

## 2016-11-15 NOTE — ED Notes (Signed)
States he feel better after svn treatment

## 2016-11-15 NOTE — ED Provider Notes (Signed)
[facility] Emergency Department Provider Note  ____________________________________________   First MD Initiated Contact with Patient 11/15/16 1440     (approximate)  I have reviewed the triage vital signs and the nursing notes.   HISTORY  Chief Complaint Cough and Dental Pain    HPI Shane Leach is a 31 y.o. male is here with complaint of exacerbation of this asthma along with bronchitis. Patient states that he is been "feeling tight "the last few days. Patient states he has not had any an inhaler for months and is aware that he is wheezing. He denies any fever or chills. He denies any nausea or vomiting. Patient also complains of upper right dental pain. Patient has a history of asthma. Patient states that he's stopped smoking in 2016. He rates his pain as a 10 over 10.   Past Medical History:  Diagnosis Date  . Asthma   . Bronchitis   . Epilepsy ([place])   . Seizures ([place])     There are no active problems to display for this patient.   Past Surgical History:  Procedure Laterality Date  . testicular cancer     right testicle removed    Prior to Admission medications   Medication Sig Start Date End Date Taking? Authorizing Provider  albuterol (PROVENTIL HFA;VENTOLIN HFA) 108 (90 Base) MCG/ACT inhaler Inhale 2 puffs into the lungs every 6 (six) hours as needed for wheezing or shortness of breath. 11/15/16   Johnn Hai, PA-C  amoxicillin (AMOXIL) 500 MG capsule Take 1 capsule (500 mg total) by mouth 3 (three) times daily. 11/15/16   Johnn Hai, PA-C  levETIRAcetam (KEPPRA) 1000 MG tablet Take 1 tablet (1,000 mg total) by mouth 3 (three) times daily. 05/15/16   Schuyler Amor, MD  predniSONE (DELTASONE) 10 MG tablet Take 5 tablets  tomorrow, on day 2 take for tablets, day 3 take 3 tablets, day 4 take 2 tablets, day 5 take 1 tablets 11/15/16   Letitia Neri L, PA-C    Allergies Dilantin [phenytoin sodium extended]; Penicillins; and  Tegretol [carbamazepine]  No family history on file.  Social History Social History  Substance Use Topics  . Smoking status: Former Smoker    Packs/day: 0.50    Types: Cigarettes    Quit date: 10/12/2014  . Smokeless tobacco: Never Used  . Alcohol use No    Review of Systems  Constitutional: No fever/chills ENT: No sore throat.Denies ear pain. Cardiovascular: Denies chest pain. Respiratory: Positive shortness of breath due to wheezing on exertion only. Gastrointestinal: No abdominal pain.  No nausea, no vomiting.  Skin: Negative for rash. Neurological: Negative for headaches   ____________________________________________   PHYSICAL EXAM:  VITAL SIGNS: ED Triage Vitals  Enc Vitals Group     BP 11/15/16 1325 137/83     Pulse Rate 11/15/16 1325 94     Resp 11/15/16 1325 16     Temp 11/15/16 1325 98.1 F (36.7 C)     Temp Source 11/15/16 1325 Oral     SpO2 11/15/16 1325 97 %     Weight 11/15/16 1323 210 lb (95.3 kg)     Height 11/15/16 1323 (!) 6" (0.152 m)     Head Circumference --      Peak Flow --      Pain Score 11/15/16 1323 10     Pain Loc --      Pain Edu? --      Excl. in [place]? --  Constitutional: Alert and oriented. Well appearing and in no acute distress. Eyes: Conjunctivae are normal. PERRL. EOMI. Head: Atraumatic. Nose: No congestion/rhinnorhea. Mouth/Throat: Mucous membranes are moist.  Oropharynx non-erythematous. Right upper posterior molar is in poor repair with gums tender but no actual abscess or drainage noted. Neck: No stridor.   Hematological/Lymphatic/Immunilogical: No cervical lymphadenopathy. Cardiovascular: Normal rate, regular rhythm. Grossly normal heart sounds.  Good peripheral circulation. Respiratory: Normal respiratory effort.  No retractions. Lungs bilateral expiratory wheezes are heard throughout. Gastrointestinal: Soft and nontender. No distention.  Musculoskeletal: No lower extremity tenderness nor edema.  Neurologic:  Normal  speech and language. No gross focal neurologic deficits are appreciated. No gait instability. Skin:  Skin is warm, dry and intact. No rash noted. Psychiatric: Mood and affect are normal. Speech and behavior are normal.  ____________________________________________   LABS (all labs ordered are listed, but only abnormal results are displayed)  Labs Reviewed - No data to display _  RADIOLOGY  Dg Chest 2 View  Result Date: 11/15/2016 CLINICAL DATA:  Short of breath.  Productive cough for 1 week EXAM: CHEST  2 VIEW COMPARISON:  08/28/2015 FINDINGS: Normal mediastinum and cardiac silhouette. Normal pulmonary vasculature. No evidence of effusion, infiltrate, or pneumothorax. No acute bony abnormality. IMPRESSION: Normal chest radiograph. Electronically Signed   By: Suzy Bouchard M.D.   On: 11/15/2016 16:03    ____________________________________________   PROCEDURES  Procedure(s) performed: None  Procedures  Critical Care performed: No  ____________________________________________   INITIAL IMPRESSION / ASSESSMENT AND PLAN / ED COURSE  Pertinent labs & imaging results that were available during my care of the patient were reviewed by me and considered in my medical decision making (see chart for details).  Patient was given prednisone 60 mg by mouth in the department and 2 DuoNeb treatments. Prior to discharge he was improved greatly and wheezing was to a minimal. Patient was placed on amoxicillin for his dental infection and given a list of dental clinics to follow up with. He is also to follow-up with [facility] any continued problems with his asthma. Patient was given a prescription for continued prednisone taper starting tomorrow and albuterol inhaler with 2 refills.      ____________________________________________   FINAL CLINICAL IMPRESSION(S) / ED DIAGNOSES  Final diagnoses:  Acute bronchitis, unspecified organism  Asthma exacerbation, mild    Dental caries      NEW MEDICATIONS STARTED DURING THIS VISIT:  Discharge Medication List as of 11/15/2016  4:27 PM    START taking these medications   Details  amoxicillin (AMOXIL) 500 MG capsule Take 1 capsule (500 mg total) by mouth 3 (three) times daily., Starting Fri 11/15/2016, Print    predniSONE (DELTASONE) 10 MG tablet Take 5 tablets  tomorrow, on day 2 take for tablets, day 3 take 3 tablets, day 4 take 2 tablets, day 5 take 1 tablets, Print         Note:  This document was prepared using Dragon voice recognition software and may include unintentional dictation errors.    Johnn Hai, PA-C 11/15/16 1654    Schaevitz, Randall An, MD 11/16/16 [phone]

## 2016-11-15 NOTE — Discharge Instructions (Signed)
Follow-up with your primary care provider or [facility] if any continued problems with her asthma. Begin taking prednisone tomorrow and follow directions. Also albuterol inhaler as directed. Amoxicillin 500 mg 3 times a day for dental infection. Also contact the dental clinics listed on your discharge papers for continued dental care.  OPTIONS FOR DENTAL FOLLOW UP CARE  [email or web address] [phone])  [facility] [phone])  [facility] [phone])  [facility] [phone])  [facility] [phone]) This clinic caters to the indigent population and is on a lottery system. Location: [facility], [place], [place], [place] [facility] Hours: Wednesdays from 6pm - 9pm, patients seen by a lottery system. For dates, call or go to [email or web address] Services: Cleanings, fillings and simple extractions. Payment Options: DENTAL WORK IS FREE OF CHARGE. Bring proof of income or support. Best way to get seen: Arrive at 5:15 pm - this is a lottery, NOT first come/first serve, so arriving earlier will not increase your chances of being seen.     [facility] [phone] Select option 1 for emergencies   Location: [facility], [place], [address], [place] Clinic Hours: No walk-ins accepted - call the day before to schedule an appointment. Check in times are 9:30 am and 1:30 pm. Services: Simple extractions, temporary fillings, pulpectomy/pulp debridement, uncomplicated abscess drainage. Payment Options: PAYMENT IS DUE AT THE TIME OF SERVICE.  Fee is usually $100-200, additional surgical procedures (e.g. abscess drainage) may be extra. Cash, checks, Visa/MasterCard  accepted.  Can file Medicaid if patient is covered for dental - patient should call case worker to check. No discount for [facility] patients. Best way to get seen: MUST call the day before and get onto the schedule. Can usually be seen the next 1-2 days. No walk-ins accepted.     [place] [phone]   Location: [place], [place] [facility] Hours: M, W, Th, F 8am or 1:30pm, Tues 9a or 1:30 - first come/first served. Services: Simple extractions, temporary fillings, uncomplicated abscess drainage.  You do not need to be an [facility] resident. Payment Options: PAYMENT IS DUE AT THE TIME OF SERVICE. Dental insurance, otherwise sliding scale - bring proof of income or support. Depending on income and treatment needed, cost is usually $50-200. Best way to get seen: Arrive early as it is first come/first served.     [facility] [phone]   Location: [place] [facility] Hours: Mon-Thu 8a-5p Services: Most basic dental services including extractions and fillings. Payment Options: PAYMENT IS DUE AT THE TIME OF SERVICE. Sliding scale, up to 50% off - bring proof if income or support. Medicaid with dental option accepted. Best way to get seen: Call to schedule an appointment, can usually be seen within 2 weeks OR they will try to see walk-ins - show up at [place] or 2p (you may have to wait).     [facility] [place] RESIDENTS ONLY   Location: [facility], [place] [address], [place], [place] [address] Clinic Hours: By appointment only. Monday - Thursday 8am-5pm, Friday 8am-12pm Services: Cleanings, fillings, extractions. Payment Options: PAYMENT IS DUE AT THE TIME OF SERVICE. Cash, Visa or MasterCard. Sliding scale - $30 minimum per service. Best way to get  seen: Come in to office, complete packet and make an  appointment - need proof of income or support monies for each household member and proof of [facility] residence. Usually takes about a month to get in.     [facility] [phone]   Location: [address], [place] Clinic Hours: Walk-in Urgent Care Dental Services are offered Monday-Friday mornings only. The numbers of emergencies accepted daily is limited to the number of providers available. Maximum 15 - Mondays, Wednesdays & Thursdays Maximum 10 - Tuesdays & Fridays Services: You do not need to be a [facility] resident to be seen for a dental emergency. Emergencies are defined as pain, swelling, abnormal bleeding, or dental trauma. Walkins will receive x-rays if needed. NOTE: Dental cleaning is not an emergency. Payment Options: PAYMENT IS DUE AT THE TIME OF SERVICE. Minimum co-pay is $40.00 for uninsured patients. Minimum co-pay is $3.00 for Medicaid with dental coverage. Dental Insurance is accepted and must be presented at time of visit. Medicare does not cover dental. Forms of payment: Cash, credit card, checks. Best way to get seen: If not previously registered with the clinic, walk-in dental registration begins at 7:15 am and is on a first come/first serve basis. If previously registered with the clinic, call to make an appointment.     [facility] [place] ONLY   Location: [address] [place], [place] Clinic Hours: Mon-Thu 10a-2p Services: Extractions only! Payment Options: FREE (donations accepted) - bring proof of income or support Best way to get seen: Call and schedule an appointment OR come at 8am on the 1st Monday of every month (except for holidays) when it is first come/first served.     [facility] [phone]   Location: [place], [place] Clinic Hours: Friday mornings Services, Payment Options, Best way to get seen: Call for info

## 2016-11-15 NOTE — ED Triage Notes (Signed)
Patient states he has asthma and bronchitis and he has been feeling "tight" to upper back for the past few days.  Also right upper jaw dental pain.

## 2016-11-15 NOTE — ED Notes (Signed)
See triage note he is having some pain to right upper jaw area ..states thinks it is his tooth  Also having "tight" feeling to upper chest  Occasional wheeze note

## 2016-11-17 ENCOUNTER — Observation Stay
Admission: EM | Admit: 2016-11-17 | Discharge: 2016-11-18 | Disposition: A | Discharge status: Home / Self Care | Payer: Self-pay | Attending: Internal Medicine | Admitting: Internal Medicine

## 2016-11-17 ENCOUNTER — Emergency Department: Payer: Self-pay

## 2016-11-17 DIAGNOSIS — Z880 Allergy status to penicillin: Secondary | ICD-10-CM | POA: Insufficient documentation

## 2016-11-17 DIAGNOSIS — J45909 Unspecified asthma, uncomplicated: Secondary | ICD-10-CM | POA: Diagnosis present

## 2016-11-17 DIAGNOSIS — E119 Type 2 diabetes mellitus without complications: Secondary | ICD-10-CM | POA: Insufficient documentation

## 2016-11-17 DIAGNOSIS — E876 Hypokalemia: Secondary | ICD-10-CM | POA: Insufficient documentation

## 2016-11-17 DIAGNOSIS — J45901 Unspecified asthma with (acute) exacerbation: Principal | ICD-10-CM | POA: Insufficient documentation

## 2016-11-17 DIAGNOSIS — Z87891 Personal history of nicotine dependence: Secondary | ICD-10-CM | POA: Insufficient documentation

## 2016-11-17 DIAGNOSIS — M549 Dorsalgia, unspecified: Secondary | ICD-10-CM | POA: Insufficient documentation

## 2016-11-17 DIAGNOSIS — I10 Essential (primary) hypertension: Secondary | ICD-10-CM | POA: Insufficient documentation

## 2016-11-17 DIAGNOSIS — G40909 Epilepsy, unspecified, not intractable, without status epilepticus: Secondary | ICD-10-CM | POA: Insufficient documentation

## 2016-11-17 DIAGNOSIS — R0902 Hypoxemia: Secondary | ICD-10-CM

## 2016-11-17 LAB — CBC WITH DIFFERENTIAL/PLATELET
BASOS ABS: 0 10*3/uL (ref 0–0.1)
Basophils Relative: 0 %
EOS PCT: 2 %
Eosinophils Absolute: 0.1 10*3/uL (ref 0–0.7)
HEMATOCRIT: 52 % (ref 40.0–52.0)
Hemoglobin: 18 g/dL (ref 13.0–18.0)
LYMPHS PCT: 10 %
Lymphs Abs: 0.9 10*3/uL — ABNORMAL LOW (ref 1.0–3.6)
MCH: 31 pg (ref 26.0–34.0)
MCHC: 34.5 g/dL (ref 32.0–36.0)
MCV: 89.9 fL (ref 80.0–100.0)
MONO ABS: 0.9 10*3/uL (ref 0.2–1.0)
MONOS PCT: 9 %
Neutro Abs: 7.6 10*3/uL — ABNORMAL HIGH (ref 1.4–6.5)
Neutrophils Relative %: 79 %
Platelets: 180 10*3/uL (ref 150–440)
RBC: 5.79 MIL/uL (ref 4.40–5.90)
RDW: 14.1 % (ref 11.5–14.5)
WBC: 9.6 10*3/uL (ref 3.8–10.6)

## 2016-11-17 LAB — BASIC METABOLIC PANEL
ANION GAP: 10 (ref 5–15)
BUN: 12 mg/dL (ref 6–20)
CALCIUM: 9.4 mg/dL (ref 8.9–10.3)
CO2: 21 mmol/L — AB (ref 22–32)
CREATININE: 1.21 mg/dL (ref 0.61–1.24)
Chloride: 106 mmol/L (ref 101–111)
GFR calc Af Amer: >60 mL/min (ref >60)
GLUCOSE: 99 mg/dL (ref 65–99)
Potassium: 3.4 mmol/L — ABNORMAL LOW (ref 3.5–5.1)
Sodium: 137 mmol/L (ref 135–145)

## 2016-11-17 MED ORDER — KETOROLAC TROMETHAMINE 30 MG/ML IJ SOLN
30.0000 mg | Freq: Once | INTRAMUSCULAR | Status: AC
  Administered 2016-11-17: 30 mg via INTRAVENOUS

## 2016-11-17 MED ORDER — IPRATROPIUM-ALBUTEROL 0.5-2.5 (3) MG/3ML IN SOLN
3.0000 mL | Freq: Once | RESPIRATORY_TRACT | Status: AC
  Administered 2016-11-17: 3 mL via RESPIRATORY_TRACT
  Filled 2016-11-17: qty 3

## 2016-11-17 MED ORDER — METHYLPREDNISOLONE SODIUM SUCC 125 MG IJ SOLR
125.0000 mg | Freq: Once | INTRAMUSCULAR | Status: AC
  Administered 2016-11-17: 125 mg via INTRAVENOUS
  Filled 2016-11-17: qty 2

## 2016-11-17 MED ORDER — ALBUTEROL SULFATE (2.5 MG/3ML) 0.083% IN NEBU
5.0000 mg | INHALATION_SOLUTION | Freq: Once | RESPIRATORY_TRACT | Status: AC
Start: 2016-11-17 — End: 2016-11-17
  Administered 2016-11-17: 5 mg via RESPIRATORY_TRACT
  Filled 2016-11-17: qty 6

## 2016-11-17 MED ORDER — KETOROLAC TROMETHAMINE 30 MG/ML IJ SOLN
INTRAMUSCULAR | Status: AC
  Filled 2016-11-17: qty 1

## 2016-11-17 MED ORDER — DEXTROSE 5 % IV SOLN
500.0000 mg | Freq: Once | INTRAVENOUS | Status: AC
  Administered 2016-11-18: 500 mg via INTRAVENOUS
  Filled 2016-11-17: qty 500

## 2016-11-17 NOTE — ED Provider Notes (Signed)
[facility] Emergency Department Provider Note  ____________________________________________   First MD Initiated Contact with Patient 11/17/16 2145     (approximate)  I have reviewed the triage vital signs and the nursing notes.   HISTORY  Chief Complaint Shortness of Breath   HPI Shane Leach is a 31 y.o. male with a history of asthma as well as bronchitis who is presenting to the emergency department today with worsening wheezing and cough as well as back pain which she describes as a tightness. He was seen in the emergency department 2 days ago and was discharged with prescription medications but he says due to insurance issues and financial difficulties he was not able to obtain his medications. He is presenting back with worsening symptoms at this time.   Past Medical History:  Diagnosis Date  . Asthma   . Bronchitis   . Epilepsy ([place])   . Seizures ([place])     There are no active problems to display for this patient.   Past Surgical History:  Procedure Laterality Date  . testicular cancer     right testicle removed    Prior to Admission medications   Medication Sig Start Date End Date Taking? Authorizing Provider  albuterol (PROVENTIL HFA;VENTOLIN HFA) 108 (90 Base) MCG/ACT inhaler Inhale 2 puffs into the lungs every 6 (six) hours as needed for wheezing or shortness of breath. 11/15/16   Johnn Hai, PA-C  amoxicillin (AMOXIL) 500 MG capsule Take 1 capsule (500 mg total) by mouth 3 (three) times daily. 11/15/16   Johnn Hai, PA-C  levETIRAcetam (KEPPRA) 1000 MG tablet Take 1 tablet (1,000 mg total) by mouth 3 (three) times daily. 05/15/16   Schuyler Amor, MD  predniSONE (DELTASONE) 10 MG tablet Take 5 tablets  tomorrow, on day 2 take for tablets, day 3 take 3 tablets, day 4 take 2 tablets, day 5 take 1 tablets 11/15/16   Letitia Neri L, PA-C    Allergies Dilantin [phenytoin sodium extended]; Penicillins; and Tegretol  [carbamazepine]  History reviewed. No pertinent family history.  Social History Social History  Substance Use Topics  . Smoking status: Former Smoker    Packs/day: 0.50    Types: Cigarettes    Quit date: 10/12/2014  . Smokeless tobacco: Never Used  . Alcohol use No    Review of Systems  Constitutional: subjective fever Eyes: No visual changes. ENT: No sore throat. Cardiovascular: Denies chest pain. Respiratory: as above Gastrointestinal: No abdominal pain.  No nausea, no vomiting.  No diarrhea.  No constipation. Genitourinary: Negative for dysuria. Musculoskeletal: Thoracic back pain across his thoracic region which she describes as a tightness. Skin: Negative for rash. Neurological: Negative for headaches, focal weakness or numbness.   ____________________________________________   PHYSICAL EXAM:  VITAL SIGNS: ED Triage Vitals  Enc Vitals Group     BP 11/17/16 2147 (!) 149/87     Pulse Rate 11/17/16 2147 (!) 107     Resp 11/17/16 2147 18     Temp 11/17/16 2147 98.4 F (36.9 C)     Temp Source 11/17/16 2147 Oral     SpO2 11/17/16 2147 90 %     Weight 11/17/16 2147 210 lb (95.3 kg)     Height 11/17/16 2147 6' (1.829 m)     Head Circumference --      Peak Flow --      Pain Score 11/17/16 2146 8     Pain Loc --      Pain  Edu? --      Excl. in [place]? --     Constitutional: Alert and oriented. Well appearing and in no acute distress. Eyes: Conjunctivae are normal.  Head: Atraumatic. Nose: No congestion/rhinnorhea. Mouth/Throat: Mucous membranes are moist.  Neck: No stridor.   Cardiovascular: Tachycardic, regular rhythm. Grossly normal heart sounds.   Respiratory: Tachypneic but speaking in full sentences. Coarse wheezing throughout. Prolonged expiratory phase. Gastrointestinal: Soft and nontender. No distention.  Musculoskeletal: No lower extremity tenderness nor edema.  No joint effusions. No tenderness to the thoracic region. Neurologic:  Normal speech and  language. No gross focal neurologic deficits are appreciated. Skin:  Skin is warm, dry and intact. No rash noted. Psychiatric: Mood and affect are normal. Speech and behavior are normal.  ____________________________________________   LABS (all labs ordered are listed, but only abnormal results are displayed)  Labs Reviewed  CBC WITH DIFFERENTIAL/PLATELET - Abnormal; Notable for the following:       Result Value   Neutro Abs 7.6 (*)    Lymphs Abs 0.9 (*)    All other components within normal limits  BASIC METABOLIC PANEL - Abnormal; Notable for the following:    Potassium 3.4 (*)    CO2 21 (*)    All other components within normal limits   ____________________________________________  EKG  ED ECG REPORT I, Doran Stabler, the attending physician, personally viewed and interpreted this ECG.   Date: 11/17/2016  EKG Time: 2145  Rate: 105  Rhythm: sinus tachycardia  Axis: Normal  Intervals: Left anterior fascicular block.  ST&T Change: Concave morphology ST elevations in the precordial leads likely consistent with early re-pole. Similar appearance to previous EKGs on the record. ____________________________________________  RADIOLOGY  No acute finding on the chest x-ray. ____________________________________________   PROCEDURES  Procedure(s) performed:   Procedures  Critical Care performed:   ____________________________________________   INITIAL IMPRESSION / ASSESSMENT AND PLAN / ED COURSE  Pertinent labs & imaging results that were available during my care of the patient were reviewed by me and considered in my medical decision making (see chart for details).  ----------------------------------------- 11:36 PM on 11/17/2016 -----------------------------------------  Patient received 2 DuoNeb en route. Received another DuoNeb here as well as 2 albuterol nebulizers and is now requiring 2 L of nasal cannula oxygen to maintain a 90-92% oxygen saturation.  He persistently has diffuse wheezing with decreased air movement. He will be admitted to the hospital. He is aware of the need for admission the hospital as he is having a repeat visit with worsening symptoms. Signed out to Dr. Ara Kussmaul.       ____________________________________________   FINAL CLINICAL IMPRESSION(S) / ED DIAGNOSES  Asthma exacerbation. Hypoxia    NEW MEDICATIONS STARTED DURING THIS VISIT:  New Prescriptions   No medications on file     Note:  This document was prepared using Dragon voice recognition software and may include unintentional dictation errors.     Orbie Pyo, MD 11/17/16 [phone]

## 2016-11-17 NOTE — ED Triage Notes (Signed)
Pt presents to ED via [facility] from home for diff breathing. Pt seen here few days ago and dx bronchitis and asthma, given prescriptions for inhaler and steroids. Has not filled them yet. States feeling like breathing is worse. EMS states labored wheezing, diaphoretic upon arrival. 20 L AC in place. Pt received 2 dounebs enroute. Pt arrives tachypenic and labored, wheezing. States breathing tx helped. Dropped to 88% RA, placed on 2 L nasal cannula. EMS reports pt was in upper 80's on RA with them.

## 2016-11-17 NOTE — ED Notes (Signed)
Report given to April RN

## 2016-11-17 NOTE — H&P (Signed)
History and Physical   [facility] [place] @ [facility] Admission History and Physical [employer], D.O.    Patient Name: Shane Leach MR#: [id number] Date of Birth: 1986/05/15 Date of Admission: 11/17/2016  Referring MD/NP/PA: Dr. Clearnce Hasten Primary Care Physician: Patient, No Pcp Per Patient coming from: Home  Chief Complaint:  Chief Complaint  Patient presents with  . Shortness of Breath    HPI: Shane Leach is a 31 y.o. male with a known history of asthma, bronchitis, seizures disorder presents to the emergency department for evaluation of SOB.  Patient was seen in this ED two days ago, diagnosed with acute bronchitis and discharged home with steroids and amoxicillin.  He was unable to obtain his medications due to insurance. Returns today because symptoms are worsening.   Patient denies fevers/chills, weakness, dizziness, chest pain, N/V/C/D, abdominal pain, dysuria/frequency, changes in mental status.    Other he has had multiple emergency department visits he has never been hospitalized or intubated for asthma. He does not have an inhaler at present. Otherwise there has been no change in status. Patient has been taking medication as prescribed and there has been no recent change in medication or diet.  No recent antibiotics.  There has been no recent illness, hospitalizations, travel or sick contacts.    EMS/ED Course: Patient received Duonebs, albuterol, Toradol, Solumedrol.  Medical admission was requested for continued management of acute exacerbation of asthma/bronchitis.  Review of Systems:  CONSTITUTIONAL: No fever/chills, fatigue, weakness, weight gain/loss, headache. EYES: No blurry or double vision. ENT: No tinnitus, postnasal drip, redness or soreness of the oropharynx. RESPIRATORY: Positive cough, dyspnea, wheeze.  No hemoptysis.  CARDIOVASCULAR: No chest pain, palpitations, syncope, orthopnea. No lower extremity edema.  GASTROINTESTINAL: No  nausea, vomiting, abdominal pain, diarrhea, constipation.  No hematemesis, melena or hematochezia. GENITOURINARY: No dysuria, frequency, hematuria. ENDOCRINE: No polyuria or nocturia. No heat or cold intolerance. HEMATOLOGY: No anemia, bruising, bleeding. INTEGUMENTARY: No rashes, ulcers, lesions. MUSCULOSKELETAL: Positive back pain. No arthritis, gout, dyspnea. NEUROLOGIC: No numbness, tingling, ataxia, seizure-type activity, weakness. PSYCHIATRIC: No anxiety, depression, insomnia.   Past Medical History:  Diagnosis Date  . Asthma   . Bronchitis   . Epilepsy ([place])   . Seizures ([place])     Past Surgical History:  Procedure Laterality Date  . testicular cancer     right testicle removed     reports that he quit smoking about 2 years ago. His smoking use included Cigarettes. He smoked 0.50 packs per day. He has never used smokeless tobacco. He reports that he does not drink alcohol or use drugs.  Allergies  Allergen Reactions  . Dilantin [Phenytoin Sodium Extended] Other (See Comments)  . Penicillins Other (See Comments)  . Tegretol [Carbamazepine] Other (See Comments)   Family History   Medical History Relation Name Comments  Diabetes Mother    Hypertension Mother       Prior to Admission medications   Medication Sig Start Date End Date Taking? Authorizing Provider  albuterol (PROVENTIL HFA;VENTOLIN HFA) 108 (90 Base) MCG/ACT inhaler Inhale 2 puffs into the lungs every 6 (six) hours as needed for wheezing or shortness of breath. 11/15/16   Johnn Hai, PA-C  amoxicillin (AMOXIL) 500 MG capsule Take 1 capsule (500 mg total) by mouth 3 (three) times daily. 11/15/16   Johnn Hai, PA-C  levETIRAcetam (KEPPRA) 1000 MG tablet Take 1 tablet (1,000 mg total) by mouth 3 (three) times daily. 05/15/16   Schuyler Amor, MD  predniSONE (DELTASONE)  10 MG tablet Take 5 tablets  tomorrow, on day 2 take for tablets, day 3 take 3 tablets, day 4 take 2 tablets, day 5 take 1  tablets 11/15/16   Johnn Hai, [place]    Physical Exam: Vitals:   11/17/16 2215 11/17/16 2230 11/17/16 2300 11/17/16 2315  BP: (!) 155/87 (!) 142/90    Pulse: (!) 104 (!) 110 (!) 108 97  Resp: 14 (!) 23 (!) 24 16  Temp:      TempSrc:      SpO2: 93% 95% 91% 91%  Weight:      Height:        GENERAL: 31 y.o.-year-old male patient, well-developed, well-nourished lying in the bed in no acute distress.  Pleasant and cooperative.   HEENT: Head atraumatic, normocephalic. Pupils equal, round, reactive to light and accommodation. No scleral icterus. Extraocular muscles intact. Nares are patent. Oropharynx is clear. Mucus membranes moist. NECK: Supple, full range of motion. No JVD, no bruit heard. No thyroid enlargement, no tenderness, no cervical lymphadenopathy. CHEST: Diffuse wheezing. No use of accessory muscles of respiration.  No reproducible chest wall tenderness.  CARDIOVASCULAR: S1, S2 normal. No murmurs, rubs, or gallops. Cap refill <2 seconds. Pulses intact distally.  ABDOMEN: Soft, nondistended, nontender. No rebound, guarding, rigidity. Normoactive bowel sounds present in all four quadrants. No organomegaly or mass. EXTREMITIES: No pedal edema, cyanosis, or clubbing. No calf tenderness or Homan's sign.  NEUROLOGIC: The patient is alert and oriented x 3. Cranial nerves II through XII are grossly intact with no focal sensorimotor deficit. Muscle strength 5/5 in all extremities. Sensation intact. Gait not checked. PSYCHIATRIC:  Normal affect, mood, thought content. SKIN: Warm, dry, and intact without obvious rash, lesion, or ulcer.    Labs on Admission:  CBC:  Recent Labs Lab 11/17/16 2153  WBC 9.6  NEUTROABS 7.6*  HGB 18.0  HCT 52.0  MCV 89.9  PLT 683   Basic Metabolic Panel:  Recent Labs Lab 11/17/16 2153  NA 137  K 3.4*  CL 106  CO2 21*  GLUCOSE 99  BUN 12  CREATININE 1.21  CALCIUM 9.4   GFR: Estimated Creatinine Clearance: 108.9 mL/min (by C-G formula  based on SCr of 1.21 mg/dL). Liver Function Tests: No results for input(s): AST, ALT, ALKPHOS, BILITOT, PROT, ALBUMIN in the last 168 hours. No results for input(s): LIPASE, AMYLASE in the last 168 hours. No results for input(s): AMMONIA in the last 168 hours. Coagulation Profile: No results for input(s): INR, PROTIME in the last 168 hours. Cardiac Enzymes: No results for input(s): CKTOTAL, CKMB, CKMBINDEX, TROPONINI in the last 168 hours. BNP (last 3 results) No results for input(s): PROBNP in the last 8760 hours. HbA1C: No results for input(s): HGBA1C in the last 72 hours. CBG: No results for input(s): GLUCAP in the last 168 hours. Lipid Profile: No results for input(s): CHOL, HDL, LDLCALC, TRIG, CHOLHDL, LDLDIRECT in the last 72 hours. Thyroid Function Tests: No results for input(s): TSH, T4TOTAL, FREET4, T3FREE, THYROIDAB in the last 72 hours. Anemia Panel: No results for input(s): VITAMINB12, FOLATE, FERRITIN, TIBC, IRON, RETICCTPCT in the last 72 hours. Urine analysis:    Component Value Date/Time   COLORURINE YELLOW (A) 04/23/2016 2004   APPEARANCEUR CLEAR (A) 04/23/2016 2004   LABSPEC 1.017 04/23/2016 2004   PHURINE 6.0 04/23/2016 2004   GLUCOSEU NEGATIVE 04/23/2016 2004   HGBUR 1+ (A) 04/23/2016 2004   BILIRUBINUR NEGATIVE 04/23/2016 2004   [place] NEGATIVE 04/23/2016 2004   PROTEINUR NEGATIVE 04/23/2016  2004   NITRITE NEGATIVE 04/23/2016 2004   LEUKOCYTESUR NEGATIVE 04/23/2016 2004   Sepsis Labs: @LABRCNTIP (procalcitonin:4,lacticidven:4) )No results found for this or any previous visit (from the past 240 hour(s)).   Radiological Exams on Admission: Dg Chest 2 View  Result Date: 11/17/2016 CLINICAL DATA:  Acute onset of shortness of breath. Initial encounter. EXAM: CHEST  2 VIEW COMPARISON:  Chest radiograph performed 11/15/2016 FINDINGS: The lungs are well-aerated. Peribronchial thickening is noted. There is no evidence of focal opacification, pleural effusion  or pneumothorax. The heart is normal in size; the mediastinal contour is within normal limits. No acute osseous abnormalities are seen. IMPRESSION: Peribronchial thickening noted.  Lungs otherwise clear. Electronically Signed   By: Garald Balding M.D.   On: 11/17/2016 23:09    EKG: Sinus tachycardia at 105 bpm, LAHB, nonspecific ST-T wave changes.   Assessment/Plan  This is a 31 y.o. male with a history of asthma, bronchitis, seizures disorder  now being admitted with:  #. Acute exacerbation of asthma/bronchitis -Admit to observation with continuous pulse oximetry. -Continue nebulizers, O2, azithromycin and steroids -Expectorant as needed -Consider pulmonology consult if not improving.   #. Hypokalemia, mild - Replace PO  #. Back pain related to asthma exacerbation - NSAIDs  #. History of seizure - Continue Keppra 1g TID  Admission status: Observation IV Fluids: NS Diet/Nutrition: Regular Consults called: None  DVT Px:  SCDs and early ambulation. Code Status: Full Code  Disposition Plan: To home in 1-2 days  All the records are reviewed and case discussed with ED provider. Management plans discussed with the patient and/or family who express understanding and agree with plan of care.  Aidynn Krenn D.O. on 11/17/2016 at 11:30 PM Between 7am to 6pm - Pager - [phone] After 6pm go to [email or web address] - password [email or web address] [facility] [place] [facility] Office [phone] CC: Primary care physician; Patient, No Pcp Per   11/17/2016, 11:30 PM

## 2016-11-17 NOTE — ED Notes (Signed)
Patient states he feels better after the neb treatment. Wife at bedside.

## 2016-11-17 NOTE — ED Notes (Addendum)
Pt in xray. Report from [place], rn.

## 2016-11-18 LAB — CBC
HEMATOCRIT: 51.7 % (ref 40.0–52.0)
HEMOGLOBIN: 17.6 g/dL (ref 13.0–18.0)
MCH: 30.5 pg (ref 26.0–34.0)
MCHC: 34 g/dL (ref 32.0–36.0)
MCV: 89.6 fL (ref 80.0–100.0)
Platelets: 201 10*3/uL (ref 150–440)
RBC: 5.77 MIL/uL (ref 4.40–5.90)
RDW: 14.5 % (ref 11.5–14.5)
WBC: 6.9 10*3/uL (ref 3.8–10.6)

## 2016-11-18 LAB — BASIC METABOLIC PANEL
ANION GAP: 7 (ref 5–15)
BUN: 16 mg/dL (ref 6–20)
CO2: 23 mmol/L (ref 22–32)
Calcium: 9.5 mg/dL (ref 8.9–10.3)
Chloride: 107 mmol/L (ref 101–111)
Creatinine, Ser: 1.21 mg/dL (ref 0.61–1.24)
GFR calc Af Amer: >60 mL/min (ref >60)
GLUCOSE: 173 mg/dL — AB (ref 65–99)
POTASSIUM: 5.2 mmol/L — AB (ref 3.5–5.1)
Sodium: 137 mmol/L (ref 135–145)

## 2016-11-18 LAB — PHOSPHORUS: PHOSPHORUS: 3.2 mg/dL (ref 2.5–4.6)

## 2016-11-18 LAB — MAGNESIUM: Magnesium: 1.7 mg/dL (ref 1.7–2.4)

## 2016-11-18 MED ORDER — ZOLPIDEM TARTRATE 5 MG PO TABS: 5.0000 mg | ORAL_TABLET | Freq: Every evening | ORAL | Status: DC | PRN

## 2016-11-18 MED ORDER — ALBUTEROL SULFATE HFA 108 (90 BASE) MCG/ACT IN AERS
1.0000 | INHALATION_SPRAY | Freq: Four times a day (QID) | RESPIRATORY_TRACT | Status: DC | PRN
  Filled 2016-11-18: qty 6.7

## 2016-11-18 MED ORDER — ACETAMINOPHEN 325 MG PO TABS: 650.0000 mg | ORAL_TABLET | Freq: Four times a day (QID) | ORAL | Status: DC | PRN

## 2016-11-18 MED ORDER — LEVETIRACETAM 500 MG PO TABS
1000.0000 mg | ORAL_TABLET | Freq: Three times a day (TID) | ORAL | Status: DC
  Administered 2016-11-18: 1000 mg via ORAL
  Filled 2016-11-18 (×3): qty 2

## 2016-11-18 MED ORDER — ALBUTEROL SULFATE (2.5 MG/3ML) 0.083% IN NEBU: 2.5000 mg | INHALATION_SOLUTION | Freq: Four times a day (QID) | RESPIRATORY_TRACT | 12 refills | Status: DC | PRN

## 2016-11-18 MED ORDER — DEXTROMETHORPHAN POLISTIREX ER 30 MG/5ML PO SUER
30.0000 mg | Freq: Two times a day (BID) | ORAL | Status: DC
  Administered 2016-11-18 (×2): 30 mg via ORAL
  Filled 2016-11-18 (×3): qty 5

## 2016-11-18 MED ORDER — ACETAMINOPHEN 650 MG RE SUPP: 650.0000 mg | Freq: Four times a day (QID) | RECTAL | Status: DC | PRN

## 2016-11-18 MED ORDER — MAGNESIUM CITRATE PO SOLN
1.0000 | Freq: Once | ORAL | Status: DC | PRN
  Filled 2016-11-18: qty 296

## 2016-11-18 MED ORDER — DEXTROSE 5 % IV SOLN: 500.0000 mg | INTRAVENOUS | Status: DC

## 2016-11-18 MED ORDER — GUAIFENESIN ER 600 MG PO TB12
600.0000 mg | ORAL_TABLET | Freq: Two times a day (BID) | ORAL | Status: DC
  Administered 2016-11-18 (×2): 600 mg via ORAL
  Filled 2016-11-18 (×2): qty 1

## 2016-11-18 MED ORDER — SODIUM CHLORIDE 0.9 % IV SOLN
INTRAVENOUS | Status: DC
  Administered 2016-11-18: 01:00:00 via INTRAVENOUS

## 2016-11-18 MED ORDER — ALBUTEROL SULFATE (2.5 MG/3ML) 0.083% IN NEBU: 2.5000 mg | INHALATION_SOLUTION | Freq: Four times a day (QID) | RESPIRATORY_TRACT | Status: DC | PRN

## 2016-11-18 MED ORDER — ONDANSETRON HCL 4 MG/2ML IJ SOLN: 4.0000 mg | Freq: Four times a day (QID) | INTRAMUSCULAR | Status: DC | PRN

## 2016-11-18 MED ORDER — SENNOSIDES-DOCUSATE SODIUM 8.6-50 MG PO TABS: 1.0000 | ORAL_TABLET | Freq: Every evening | ORAL | Status: DC | PRN

## 2016-11-18 MED ORDER — METHYLPREDNISOLONE SODIUM SUCC 125 MG IJ SOLR
60.0000 mg | Freq: Four times a day (QID) | INTRAMUSCULAR | Status: DC
  Administered 2016-11-18 (×2): 60 mg via INTRAVENOUS
  Filled 2016-11-18 (×2): qty 2

## 2016-11-18 MED ORDER — AZITHROMYCIN 250 MG PO TABS
250.0000 mg | ORAL_TABLET | Freq: Every day | ORAL | Status: DC
  Administered 2016-11-18: 250 mg via ORAL
  Filled 2016-11-18: qty 1

## 2016-11-18 MED ORDER — METHYLPREDNISOLONE SODIUM SUCC 125 MG IJ SOLR
60.0000 mg | Freq: Three times a day (TID) | INTRAMUSCULAR | Status: DC
  Administered 2016-11-18: 60 mg via INTRAVENOUS
  Filled 2016-11-18: qty 2

## 2016-11-18 MED ORDER — DM-GUAIFENESIN ER 30-600 MG PO TB12: 1.0000 | ORAL_TABLET | Freq: Two times a day (BID) | ORAL | Status: DC

## 2016-11-18 MED ORDER — POTASSIUM CHLORIDE CRYS ER 20 MEQ PO TBCR
40.0000 meq | EXTENDED_RELEASE_TABLET | Freq: Once | ORAL | Status: AC
  Administered 2016-11-18: 40 meq via ORAL
  Filled 2016-11-18: qty 2

## 2016-11-18 MED ORDER — ALBUTEROL SULFATE (2.5 MG/3ML) 0.083% IN NEBU
2.5000 mg | INHALATION_SOLUTION | Freq: Four times a day (QID) | RESPIRATORY_TRACT | Status: DC | PRN
  Administered 2016-11-18: 2.5 mg via RESPIRATORY_TRACT
  Filled 2016-11-18: qty 3

## 2016-11-18 MED ORDER — ONDANSETRON HCL 4 MG PO TABS: 4.0000 mg | ORAL_TABLET | Freq: Four times a day (QID) | ORAL | Status: DC | PRN

## 2016-11-18 MED ORDER — OXYCODONE HCL 5 MG PO TABS
5.0000 mg | ORAL_TABLET | ORAL | Status: DC | PRN
  Administered 2016-11-18 (×2): 5 mg via ORAL
  Filled 2016-11-18 (×2): qty 1

## 2016-11-18 MED ORDER — IPRATROPIUM BROMIDE 0.02 % IN SOLN
0.5000 mg | Freq: Four times a day (QID) | RESPIRATORY_TRACT | Status: DC | PRN
  Administered 2016-11-18: 0.5 mg via RESPIRATORY_TRACT
  Filled 2016-11-18: qty 2.5

## 2016-11-18 MED ORDER — BISACODYL 5 MG PO TBEC: 5.0000 mg | DELAYED_RELEASE_TABLET | Freq: Every day | ORAL | Status: DC | PRN

## 2016-11-18 MED ORDER — ALBUTEROL SULFATE HFA 108 (90 BASE) MCG/ACT IN AERS: 1.0000 | INHALATION_SPRAY | Freq: Four times a day (QID) | RESPIRATORY_TRACT | Status: DC | PRN

## 2016-11-18 NOTE — Progress Notes (Signed)
PT Cancellation Note  Patient Details Name: Shane Leach MRN: [id number] DOB: Aug 29, 1985   Cancelled Treatment:    Reason Eval/Treat Not Completed: Other (comment) (Consult received and chart reviewed. Per primary RN, patient has ambulated around nursing unit without difficulty.  No safety needs identified; patient currently preparing for discharge. No PT needs noted; will complete order at this time.  Please re-consult should needs change.)   Shane Leach, PT, DPT, NCS 11/18/16, 11:10 AM [phone]

## 2016-11-18 NOTE — Clinical Social Work Note (Signed)
CSW consulted for Medication Assistance. CSW spoke with RNCM. RNCM is following for assistance and disposition. CSW is signing off as no further needs identified.   Darden Dates, MSW, LCSW  Clinical Social Worker  [phone]

## 2016-11-18 NOTE — Progress Notes (Signed)
11/18/2016 5:28 PM  BP 114/60 (BP Location: Right Arm)   Pulse (!) 47   Temp 97.6 F (36.4 C) (Oral)   Resp 17   Ht 6' (1.829 m)   Wt 95.3 kg (210 lb)   SpO2 92%   BMI 28.48 kg/m  Patient discharged per MD orders. Discharge instructions reviewed with patient and patient verbalized understanding. IV removed per policy. Prescriptions discussed and given to patient. Discharged ambulatory escorted by family.  Almedia Balls, RN

## 2016-11-18 NOTE — Discharge Summary (Addendum)
[place] [facility] [place] NAME: Shane Leach    MR#:  [id number]  DATE OF BIRTH:  1985-08-23  DATE OF ADMISSION:  11/17/2016 ADMITTING PHYSICIAN: Harvie Bridge, DO  DATE OF DISCHARGE: 11/18/16  PRIMARY CARE PHYSICIAN: Patient, No Pcp Per    ADMISSION DIAGNOSIS:  Hypoxia [R09.02] Exacerbation of asthma, unspecified asthma severity, unspecified whether persistent [J45.901]  DISCHARGE DIAGNOSIS:  Asthma exacerbation Tobacco abuse SECONDARY DIAGNOSIS:   Past Medical History:  Diagnosis Date  . Asthma   . Bronchitis   . Epilepsy ([place])   . Seizures [facility])     HOSPITAL COURSE:   31 y.o. male with a history of asthma, bronchitis, seizures disorder  now being admitted with:  #. Acute exacerbation of asthma/bronchitis -Continue nebulizers, O2, azithromycin and steroids -Expectorant as needed -received Iv steroids and feels a lot better --pt will resume his prednisone that was given in the ER 2 days ago  #. Hypokalemia, mild - Replace PO  #. Back pain related to asthma exacerbation - NSAIDs  #. History of seizure - Continue Keppra 1g TID  # tobacco abuse Smoking cessation advised >4 mins spent  D/c home after pt recieves this afternoons steroid dose CONSULTS OBTAINED:    DRUG ALLERGIES:   Allergies  Allergen Reactions  . Dilantin [Phenytoin Sodium Extended] Other (See Comments)  . Penicillins Other (See Comments)  . Tegretol [Carbamazepine] Other (See Comments)    DISCHARGE MEDICATIONS:   Current Discharge Medication List    START taking these medications   Details  albuterol (PROVENTIL) (2.5 MG/3ML) 0.083% nebulizer solution Take 3 mLs (2.5 mg total) by nebulization every 6 (six) hours as needed for wheezing or shortness of breath. Qty: 75 mL, Refills: 12      CONTINUE these medications which have NOT CHANGED   Details  albuterol (PROVENTIL HFA;VENTOLIN HFA) 108 (90 Base) MCG/ACT inhaler Inhale  2 puffs into the lungs every 6 (six) hours as needed for wheezing or shortness of breath. Qty: 1 Inhaler, Refills: 2    amoxicillin (AMOXIL) 500 MG capsule Take 1 capsule (500 mg total) by mouth 3 (three) times daily. Qty: 30 capsule, Refills: 0    levETIRAcetam (KEPPRA) 1000 MG tablet Take 1 tablet (1,000 mg total) by mouth 3 (three) times daily. Qty: 90 tablet, Refills: 1    predniSONE (DELTASONE) 10 MG tablet Take 5 tablets  tomorrow, on day 2 take for tablets, day 3 take 3 tablets, day 4 take 2 tablets, day 5 take 1 tablets Qty: 15 tablet, Refills: 0        If you experience worsening of your admission symptoms, develop shortness of breath, life threatening emergency, suicidal or homicidal thoughts you must seek medical attention immediately by calling 911 or calling your MD immediately  if symptoms less severe.  You Must read complete instructions/literature along with all the possible adverse reactions/side effects for all the Medicines you take and that have been prescribed to you. Take any new Medicines after you have completely understood and accept all the possible adverse reactions/side effects.   Please note  You were cared for by a hospitalist during your hospital stay. If you have any questions about your discharge medications or the care you received while you were in the hospital after you are discharged, you can call the unit and asked to speak with the hospitalist on call if the hospitalist that took care of you is not available. Once you are discharged, your primary care  physician will handle any further medical issues. Please note that NO REFILLS for any discharge medications will be authorized once you are discharged, as it is imperative that you return to your primary care physician (or establish a relationship with a primary care physician if you do not have one) for your aftercare needs so that they can reassess your need for medications and monitor your lab  values. Today   SUBJECTIVE    I feel a whole lot better VITAL SIGNS:  Blood pressure 114/60, pulse (!) 47, temperature 97.6 F (36.4 C), temperature source Oral, resp. rate 17, height 6' (1.829 m), weight 95.3 kg (210 lb), SpO2 92 %.  I/O:    Intake/Output Summary (Last 24 hours) at 11/18/16 1636 Last data filed at 11/18/16 0833  Gross per 24 hour  Intake              490 ml  Output                0 ml  Net              490 ml    PHYSICAL EXAMINATION:  GENERAL:  31 y.o.-year-old patient lying in the bed with no acute distress.  EYES: Pupils equal, round, reactive to light and accommodation. No scleral icterus. Extraocular muscles intact.  HEENT: Head atraumatic, normocephalic. Oropharynx and nasopharynx clear.  NECK:  Supple, no jugular venous distention. No thyroid enlargement, no tenderness.  LUNGS: Normal breath sounds bilaterally, no wheezing, rales,rhonchi or crepitation. No use of accessory muscles of respiration.  CARDIOVASCULAR: S1, S2 normal. No murmurs, rubs, or gallops.  ABDOMEN: Soft, non-tender, non-distended. Bowel sounds present. No organomegaly or mass.  EXTREMITIES: No pedal edema, cyanosis, or clubbing.  NEUROLOGIC: Cranial nerves II through XII are intact. Muscle strength 5/5 in all extremities. Sensation intact. Gait not checked.  PSYCHIATRIC: The patient is alert and oriented x 3.  SKIN: No obvious rash, lesion, or ulcer.   DATA REVIEW:   CBC   Recent Labs Lab 11/18/16 0413  WBC 6.9  HGB 17.6  HCT 51.7  PLT 201    Chemistries   Recent Labs Lab 11/17/16 2153 11/18/16 0413  NA 137 137  K 3.4* 5.2*  CL 106 107  CO2 21* 23  GLUCOSE 99 173*  BUN 12 16  CREATININE 1.21 1.21  CALCIUM 9.4 9.5  MG 1.7  --     Microbiology Results   No results found for this or any previous visit (from the past 240 hour(s)).  RADIOLOGY:  Dg Chest 2 View  Result Date: 11/17/2016 CLINICAL DATA:  Acute onset of shortness of breath. Initial encounter.  EXAM: CHEST  2 VIEW COMPARISON:  Chest radiograph performed 11/15/2016 FINDINGS: The lungs are well-aerated. Peribronchial thickening is noted. There is no evidence of focal opacification, pleural effusion or pneumothorax. The heart is normal in size; the mediastinal contour is within normal limits. No acute osseous abnormalities are seen. IMPRESSION: Peribronchial thickening noted.  Lungs otherwise clear. Electronically Signed   By: Garald Balding M.D.   On: 11/17/2016 23:09     Management plans discussed with the patient, family and they are in agreement.  CODE STATUS:     Code Status Orders        Start     Ordered   11/18/16 0051  Full code  Continuous     11/18/16 0050    Code Status History    Date Active Date Inactive Code Status Order ID Comments User  Context   This patient has a current code status but no historical code status.      TOTAL TIME TAKING CARE OF THIS PATIENT: 40 minutes.    Haeley Fordham M.D on 11/18/2016 at 4:36 PM  Between 7am to 6pm - Pager - [phone] After 6pm go to [email or web address] - password [email or web address] [place] [facility]  Office  [phone]  CC: Primary care physician; Patient, No Pcp Per

## 2016-11-18 NOTE — Care Management Note (Signed)
Case Management Note  Patient Details  Name: Shane Leach MRN: [id number] Date of Birth: 04/22/1986  Subjective/Objective:                   Met with patient that is from home with his wife. O2 is acute and he states he feels "his breathing is worse". Patient did not seem to be in any respiratory distress; MD and RN notified of patient complaint. Patient requests a nebulizer for home which has been requested from [facility]. He states he depends on his family for transportation. He states he is independent with mobility. No PCP. Requests assistance with PCP and medications. Action/Plan: Referral to [facility] and Medications management. Nebulizer requested from [place] with [facility]. Patient was given contact for [facility] financial assistance also. Patient was given resources for indigent dental clinics.   Expected Discharge Date:                  Expected Discharge Plan:     In-House Referral:     Discharge planning Services  CM Consult, Medication Assistance, [facility]  Post Acute Care Choice:  Durable Medical Equipment Choice offered to:  Patient  DME Arranged:    DME Agency:     HH Arranged:    [place] Agency:     Status of Service:  In process, will continue to follow  If discussed at Long Length of Stay Meetings, dates discussed:    Additional Comments:  Shane Docken, RN 11/18/2016, 9:37 AM

## 2016-11-18 NOTE — ED Notes (Signed)
Dr. Ara Kussmaul in to see pt.

## 2016-11-29 ENCOUNTER — Telehealth: Payer: Self-pay

## 2016-11-29 NOTE — Telephone Encounter (Signed)
[facility] trying to reach patient to go over eligibility requirements to establish care.

## 2017-03-25 LAB — HIV ANTIBODY (ROUTINE TESTING W REFLEX): HIV Screen 4th Generation wRfx: NONREACTIVE

## 2017-04-16 ENCOUNTER — Encounter: Payer: Self-pay | Admitting: Emergency Medicine

## 2017-04-16 ENCOUNTER — Emergency Department: Payer: Self-pay

## 2017-04-16 ENCOUNTER — Other Ambulatory Visit: Payer: Self-pay

## 2017-04-16 ENCOUNTER — Emergency Department
Admission: EM | Admit: 2017-04-16 | Discharge: 2017-04-16 | Disposition: A | Discharge status: Home / Self Care | Payer: Self-pay | Attending: Emergency Medicine | Admitting: Emergency Medicine

## 2017-04-16 DIAGNOSIS — J069 Acute upper respiratory infection, unspecified: Secondary | ICD-10-CM

## 2017-04-16 DIAGNOSIS — Z87891 Personal history of nicotine dependence: Secondary | ICD-10-CM | POA: Insufficient documentation

## 2017-04-16 DIAGNOSIS — R079 Chest pain, unspecified: Secondary | ICD-10-CM | POA: Insufficient documentation

## 2017-04-16 DIAGNOSIS — Z8547 Personal history of malignant neoplasm of testis: Secondary | ICD-10-CM | POA: Insufficient documentation

## 2017-04-16 DIAGNOSIS — Z79899 Other long term (current) drug therapy: Secondary | ICD-10-CM | POA: Insufficient documentation

## 2017-04-16 DIAGNOSIS — J45901 Unspecified asthma with (acute) exacerbation: Secondary | ICD-10-CM | POA: Insufficient documentation

## 2017-04-16 DIAGNOSIS — Z9079 Acquired absence of other genital organ(s): Secondary | ICD-10-CM | POA: Insufficient documentation

## 2017-04-16 LAB — CBC WITH DIFFERENTIAL/PLATELET
BASOS ABS: 0.1 10*3/uL (ref 0–0.1)
Basophils Relative: 1 %
Eosinophils Absolute: 0.2 10*3/uL (ref 0–0.7)
Eosinophils Relative: 2 %
HEMATOCRIT: 51.8 % (ref 40.0–52.0)
HEMOGLOBIN: 17.2 g/dL (ref 13.0–18.0)
Lymphocytes Relative: 7 %
Lymphs Abs: 0.7 10*3/uL — ABNORMAL LOW (ref 1.0–3.6)
MCH: 29.7 pg (ref 26.0–34.0)
MCHC: 33.2 g/dL (ref 32.0–36.0)
MCV: 89.4 fL (ref 80.0–100.0)
Monocytes Absolute: 0.8 10*3/uL (ref 0.2–1.0)
Monocytes Relative: 7 %
NEUTROS ABS: 9.4 10*3/uL — AB (ref 1.4–6.5)
NEUTROS PCT: 83 %
Platelets: 220 10*3/uL (ref 150–440)
RBC: 5.8 MIL/uL (ref 4.40–5.90)
RDW: 13.8 % (ref 11.5–14.5)
WBC: 11.1 10*3/uL — ABNORMAL HIGH (ref 3.8–10.6)

## 2017-04-16 LAB — COMPREHENSIVE METABOLIC PANEL
ALK PHOS: 61 U/L (ref 38–126)
ALT: 19 U/L (ref 17–63)
AST: 26 U/L (ref 15–41)
Albumin: 4.6 g/dL (ref 3.5–5.0)
Anion gap: 9 (ref 5–15)
BILIRUBIN TOTAL: 0.9 mg/dL (ref 0.3–1.2)
BUN: 13 mg/dL (ref 6–20)
CO2: 26 mmol/L (ref 22–32)
Calcium: 9.5 mg/dL (ref 8.9–10.3)
Chloride: 103 mmol/L (ref 101–111)
Creatinine, Ser: 1.43 mg/dL — ABNORMAL HIGH (ref 0.61–1.24)
GFR calc Af Amer: >60 mL/min (ref >60)
GFR calc non Af Amer: >60 mL/min (ref >60)
GLUCOSE: 115 mg/dL — AB (ref 65–99)
POTASSIUM: 4.2 mmol/L (ref 3.5–5.1)
Sodium: 138 mmol/L (ref 135–145)
TOTAL PROTEIN: 7.8 g/dL (ref 6.5–8.1)

## 2017-04-16 LAB — TROPONIN I

## 2017-04-16 MED ORDER — METHYLPREDNISOLONE SODIUM SUCC 125 MG IJ SOLR
125.0000 mg | Freq: Once | INTRAMUSCULAR | Status: AC
  Administered 2017-04-16: 125 mg via INTRAVENOUS
  Filled 2017-04-16: qty 2

## 2017-04-16 MED ORDER — MORPHINE SULFATE (PF) 4 MG/ML IV SOLN
4.0000 mg | Freq: Once | INTRAVENOUS | Status: AC
  Administered 2017-04-16: 4 mg via INTRAVENOUS
  Filled 2017-04-16: qty 1

## 2017-04-16 MED ORDER — IPRATROPIUM-ALBUTEROL 0.5-2.5 (3) MG/3ML IN SOLN
3.0000 mL | Freq: Once | RESPIRATORY_TRACT | Status: AC
  Administered 2017-04-16: 3 mL via RESPIRATORY_TRACT
  Filled 2017-04-16: qty 3

## 2017-04-16 MED ORDER — ALBUTEROL SULFATE (2.5 MG/3ML) 0.083% IN NEBU: 2.5000 mg | INHALATION_SOLUTION | Freq: Four times a day (QID) | RESPIRATORY_TRACT | 0 refills | Status: DC | PRN

## 2017-04-16 MED ORDER — IPRATROPIUM-ALBUTEROL 0.5-2.5 (3) MG/3ML IN SOLN
3.0000 mL | Freq: Once | RESPIRATORY_TRACT | Status: AC
  Administered 2017-04-16: 3 mL via RESPIRATORY_TRACT

## 2017-04-16 MED ORDER — IPRATROPIUM-ALBUTEROL 0.5-2.5 (3) MG/3ML IN SOLN
3.0000 mL | Freq: Once | RESPIRATORY_TRACT | Status: AC
  Administered 2017-04-16: 3 mL via RESPIRATORY_TRACT
  Filled 2017-04-16: qty 6

## 2017-04-16 MED ORDER — PREDNISONE 20 MG PO TABS
40.0000 mg | ORAL_TABLET | Freq: Every day | ORAL | 0 refills | Status: DC
Start: 2017-04-16 — End: 2017-08-04

## 2017-04-16 MED ORDER — ALBUTEROL SULFATE (2.5 MG/3ML) 0.083% IN NEBU
5.0000 mg | INHALATION_SOLUTION | Freq: Once | RESPIRATORY_TRACT | Status: AC
  Administered 2017-04-16: 5 mg via RESPIRATORY_TRACT
  Filled 2017-04-16: qty 6

## 2017-04-16 NOTE — ED Provider Notes (Signed)
[facility] Emergency Department Provider Note  Time seen: 7:20 AM  I have reviewed the triage vital signs and the nursing notes.   HISTORY  Chief Complaint Chest Pain    HPI THELMER LEGLER is a 31 y.o. male with a past medical history of asthma, bronchitis, epilepsy, presents to the emergency department for difficulty breathing and chest pain.  According to the patient 2 days ago he began feeling short of breath with central chest pain and occasional cough and chills.  Called EMS last night due to shortness of breath but felt better after breathing treatments.  However his shortness of breath worsened this morning along with chest pain, called EMS once again and they transported to the emergency department.  Here patient continues to state central left-sided chest pain/tightness.  Patient has diffuse wheeze with a 93% room air saturation currently.  Patient states he has been using his inhaler at home with minimal improvement.   Past Medical History:  Diagnosis Date  . Asthma   . Bronchitis   . Epilepsy ([place])   . Seizures [facility])     Patient Active Problem List   Diagnosis Date Noted  . Asthma exacerbation 11/17/2016    Past Surgical History:  Procedure Laterality Date  . testicular cancer     right testicle removed    Prior to Admission medications   Medication Sig Start Date End Date Taking? Authorizing Provider  albuterol (PROVENTIL HFA;VENTOLIN HFA) 108 (90 Base) MCG/ACT inhaler Inhale 2 puffs into the lungs every 6 (six) hours as needed for wheezing or shortness of breath. Patient not taking: Reported on 11/17/2016 11/15/16   Letitia Neri L, PA-C  albuterol (PROVENTIL) (2.5 MG/3ML) 0.083% nebulizer solution Take 3 mLs (2.5 mg total) by nebulization every 6 (six) hours as needed for wheezing or shortness of breath. 11/18/16   Fritzi Mandes, MD  amoxicillin (AMOXIL) 500 MG capsule Take 1 capsule (500 mg total) by mouth 3 (three) times daily. Patient  not taking: Reported on 11/17/2016 11/15/16   Johnn Hai, PA-C  levETIRAcetam (KEPPRA) 1000 MG tablet Take 1 tablet (1,000 mg total) by mouth 3 (three) times daily. Patient not taking: Reported on 11/17/2016 05/15/16   Schuyler Amor, MD  predniSONE (DELTASONE) 10 MG tablet Take 5 tablets  tomorrow, on day 2 take for tablets, day 3 take 3 tablets, day 4 take 2 tablets, day 5 take 1 tablets Patient not taking: Reported on 11/17/2016 11/15/16   Johnn Hai, PA-C    Allergies  Allergen Reactions  . Dilantin [Phenytoin Sodium Extended] Other (See Comments)  . Penicillins Other (See Comments)  . Tegretol [Carbamazepine] Other (See Comments)    History reviewed. No pertinent family history.  Social History Social History   Tobacco Use  . Smoking status: Former Smoker    Packs/day: 0.50    Types: Cigarettes    Last attempt to quit: 10/12/2014    Years since quitting: 2.5  . Smokeless tobacco: Never Used  Substance Use Topics  . Alcohol use: No  . Drug use: No    Review of Systems Constitutional: Negative for fever. Cardiovascular: Positive for chest pain/tightness Respiratory: Positive for shortness of breath Gastrointestinal: Negative for abdominal pain, vomiting Musculoskeletal: Negative for leg pain or swelling Neurological: Negative for headache All other ROS negative  ____________________________________________   PHYSICAL EXAM:  VITAL SIGNS: ED Triage Vitals  Enc Vitals Group     BP 04/16/17 0645 (!) 151/96     Pulse Rate  04/16/17 0645 (!) 127     Resp 04/16/17 0645 (!) 30     Temp 04/16/17 0645 98.5 F (36.9 C)     Temp Source 04/16/17 0645 Oral     SpO2 04/16/17 0645 94 %     Weight 04/16/17 0645 210 lb (95.3 kg)     Height 04/16/17 0645 6' (1.829 m)     Head Circumference --      Peak Flow --      Pain Score 04/16/17 0644 9     Pain Loc --      Pain Edu? --      Excl. in [place]? --    Constitutional: Alert and oriented.  Mild distress due to  breathing difficulty but lying in bed comfortably. Eyes: Normal exam ENT   Head: Normocephalic and atraumatic.   Mouth/Throat: Mucous membranes are moist. Cardiovascular: Regular rhythm, rate around 100 bpm. Respiratory: Moderate tachypnea, moderate wheeze bilaterally. Gastrointestinal: Soft and nontender. No distention.   Musculoskeletal: Nontender with normal range of motion in all extremities. No lower extremity tenderness or edema. Neurologic:  Normal speech and language. No gross focal neurologic deficits  Skin:  Skin is warm, dry and intact.  Psychiatric: Mood and affect are normal.  ____________________________________________    EKG  EKG reviewed and interpreted by myself shows sinus tachycardia at 120 bpm with a narrow QRS, normal axis, largely normal intervals with nonspecific ST changes but no concerning ST elevation  ____________________________________________    RADIOLOGY  X-ray negative  ____________________________________________   INITIAL IMPRESSION / ASSESSMENT AND PLAN / ED COURSE  Pertinent labs & imaging results that were available during my care of the patient were reviewed by me and considered in my medical decision making (see chart for details).  Patient presents to the emergency department with shortness of breath and chest pain/tightness for the past 2-3 days.  On exam the patient has diffuse moderate expiratory wheeze bilaterally.  Continues to state mild left-sided chest pain/tightness.  Differential would include asthma exacerbation, bronchitis, ACS, pneumonia, URI.  We will check labs, chest x-ray, EKG, treat with Solu-Medrol, breathing treatments and closely monitor in the emergency department.  Patient's x-rays negative, labs are largely normal with a negative troponin slight renal insufficiency, however in reviewing the patient's records this appears only slightly diminished from baseline. Patient states he is feeling much better however  he continues to have fairly diffuse wheeze on exam satting 92% on room air.  We will treat with 5 mg of albuterol and reassess.  Patient states he is feeling significantly better.  On reexamination he has no wheeze.  Satting 95% on room air during my evaluation.  We will discharged home with continued albuterol use every 2-4 hours as needed and 5 days of prednisone.  I discussed return precautions, the patient is agreeable to this plan of care.  ____________________________________________   FINAL CLINICAL IMPRESSION(S) / ED DIAGNOSES  Dyspnea Chest pain Asthma exacerbation   Harvest Dark, MD 04/16/17 1053

## 2017-04-16 NOTE — ED Triage Notes (Signed)
Pt arrived to the ED from home for chest pain. EMS states that they went to the Pt's home last night for SOB, they treated the Pt with a duo neb and the Pt felt better. This morning the Pt began to experience SOB and chest pain without relieve and EMS decided to bring the Pt to the hospital. Pt is AOx4 in mild respiratory distress.

## 2017-07-18 ENCOUNTER — Other Ambulatory Visit: Payer: Self-pay

## 2017-07-18 ENCOUNTER — Encounter: Payer: Self-pay | Admitting: Emergency Medicine

## 2017-07-18 ENCOUNTER — Emergency Department
Admission: EM | Admit: 2017-07-18 | Discharge: 2017-07-18 | Disposition: A | Discharge status: Home / Self Care | Payer: Self-pay | Attending: Emergency Medicine | Admitting: Emergency Medicine

## 2017-07-18 DIAGNOSIS — Z760 Encounter for issue of repeat prescription: Secondary | ICD-10-CM

## 2017-07-18 DIAGNOSIS — Z87891 Personal history of nicotine dependence: Secondary | ICD-10-CM | POA: Insufficient documentation

## 2017-07-18 DIAGNOSIS — R42 Dizziness and giddiness: Secondary | ICD-10-CM

## 2017-07-18 DIAGNOSIS — J45909 Unspecified asthma, uncomplicated: Secondary | ICD-10-CM | POA: Insufficient documentation

## 2017-07-18 LAB — CBC WITH DIFFERENTIAL/PLATELET
BASOS ABS: 0 10*3/uL (ref 0–0.1)
BASOS PCT: 0 %
Eosinophils Absolute: 0.4 10*3/uL (ref 0–0.7)
Eosinophils Relative: 4 %
HEMATOCRIT: 48.7 % (ref 40.0–52.0)
Hemoglobin: 16.2 g/dL (ref 13.0–18.0)
Lymphocytes Relative: 13 %
Lymphs Abs: 1.1 10*3/uL (ref 1.0–3.6)
MCH: 30.3 pg (ref 26.0–34.0)
MCHC: 33.4 g/dL (ref 32.0–36.0)
MCV: 90.7 fL (ref 80.0–100.0)
MONO ABS: 0.5 10*3/uL (ref 0.2–1.0)
Monocytes Relative: 6 %
NEUTROS ABS: 6.4 10*3/uL (ref 1.4–6.5)
Neutrophils Relative %: 77 %
PLATELETS: 259 10*3/uL (ref 150–440)
RBC: 5.36 MIL/uL (ref 4.40–5.90)
RDW: 14.9 % — AB (ref 11.5–14.5)
WBC: 8.4 10*3/uL (ref 3.8–10.6)

## 2017-07-18 LAB — URINALYSIS, COMPLETE (UACMP) WITH MICROSCOPIC
BILIRUBIN URINE: NEGATIVE
Bacteria, UA: NONE SEEN
Glucose, UA: NEGATIVE mg/dL
HGB URINE DIPSTICK: NEGATIVE
Ketones, ur: NEGATIVE mg/dL
LEUKOCYTES UA: NEGATIVE
NITRITE: NEGATIVE
Protein, ur: NEGATIVE mg/dL
SPECIFIC GRAVITY, URINE: 1.018 (ref 1.005–1.030)
Squamous Epithelial / LPF: NONE SEEN
pH: 6 (ref 5.0–8.0)

## 2017-07-18 LAB — COMPREHENSIVE METABOLIC PANEL
ALBUMIN: 4.2 g/dL (ref 3.5–5.0)
ALK PHOS: 51 U/L (ref 38–126)
ALT: 19 U/L (ref 17–63)
ANION GAP: 8 (ref 5–15)
AST: 27 U/L (ref 15–41)
BILIRUBIN TOTAL: 0.9 mg/dL (ref 0.3–1.2)
BUN: 16 mg/dL (ref 6–20)
CALCIUM: 9.4 mg/dL (ref 8.9–10.3)
CO2: 21 mmol/L — ABNORMAL LOW (ref 22–32)
Chloride: 111 mmol/L (ref 101–111)
Creatinine, Ser: 1.19 mg/dL (ref 0.61–1.24)
GFR calc Af Amer: >60 mL/min (ref >60)
GLUCOSE: 102 mg/dL — AB (ref 65–99)
Potassium: 4.2 mmol/L (ref 3.5–5.1)
Sodium: 140 mmol/L (ref 135–145)
TOTAL PROTEIN: 7.1 g/dL (ref 6.5–8.1)

## 2017-07-18 LAB — URINE DRUG SCREEN, QUALITATIVE (ARMC ONLY)
Amphetamines, Ur Screen: NOT DETECTED
BARBITURATES, UR SCREEN: NOT DETECTED
Benzodiazepine, Ur Scrn: NOT DETECTED
CANNABINOID 50 NG, UR ~~LOC~~: POSITIVE — AB
COCAINE METABOLITE, UR ~~LOC~~: NOT DETECTED
MDMA (Ecstasy)Ur Screen: NOT DETECTED
Methadone Scn, Ur: NOT DETECTED
OPIATE, UR SCREEN: NOT DETECTED
PHENCYCLIDINE (PCP) UR S: NOT DETECTED
Tricyclic, Ur Screen: NOT DETECTED

## 2017-07-18 MED ORDER — LEVETIRACETAM 1000 MG PO TABS
1000.0000 mg | ORAL_TABLET | Freq: Three times a day (TID) | ORAL | 0 refills | Status: DC
Start: 2017-07-18 — End: 2017-08-20

## 2017-07-18 MED ORDER — ALBUTEROL SULFATE HFA 108 (90 BASE) MCG/ACT IN AERS
2.0000 | INHALATION_SPRAY | Freq: Four times a day (QID) | RESPIRATORY_TRACT | 0 refills | Status: DC | PRN
Start: 2017-07-18 — End: 2017-11-11

## 2017-07-18 MED ORDER — LEVETIRACETAM 500 MG PO TABS
1500.0000 mg | ORAL_TABLET | Freq: Once | ORAL | Status: AC
  Administered 2017-07-18: 1500 mg via ORAL
  Filled 2017-07-18: qty 3

## 2017-07-18 NOTE — ED Triage Notes (Signed)
Pt states he feels "funny" like he might have a seizure. Pt with hx of same but has been out of his meds.

## 2017-07-18 NOTE — ED Notes (Signed)
ED Provider at bedside. 

## 2017-07-18 NOTE — ED Provider Notes (Signed)
[facility] Emergency Department Provider Note ____________________________________________   I have reviewed the triage vital signs and the triage nursing note.  HISTORY  Chief Complaint Other   Shane Leach and wife  HPI Shane Leach is a 31 y.o. male  With history of seizure disorder, off medications for months due to no pcp and trouble affording, presents to ER this morning stating that he woke up feeling lightheaded and he often feels lightheaded before he has a seizure.  He came over here "in case we could catch it before it happened.  "  No nausea no chest pain, no fevers, no headache, he states he had some mild nausea.  No vomiting or diarrhea.  That he also has asthma, uses a nebulizer because he has not reported an inhaler.  Symptoms are mild.  Previous Keppra dosing was 1000 mill grams 3 times per day.   Past Medical History:  Diagnosis Date  . Asthma   . Bronchitis   . Epilepsy ([place])   . Seizures [facility])     Leach Active Problem List   Diagnosis Date Noted  . Asthma exacerbation 11/17/2016    Past Surgical History:  Procedure Laterality Date  . testicular cancer     right testicle removed    Prior to Admission medications   Medication Sig Start Date End Date Taking? Authorizing Provider  albuterol (PROVENTIL HFA;VENTOLIN HFA) 108 (90 Base) MCG/ACT inhaler Inhale 2 puffs into the lungs every 6 (six) hours as needed for wheezing or shortness of breath. 07/18/17   Lisa Roca, MD  amoxicillin (AMOXIL) 500 MG capsule Take 1 capsule (500 mg total) by mouth 3 (three) times daily. Leach not taking: Reported on 11/17/2016 11/15/16   Johnn Hai, PA-C  levETIRAcetam (KEPPRA) 1000 MG tablet Take 1 tablet (1,000 mg total) by mouth 3 (three) times daily. Leach not taking: Reported on 07/18/2017 07/18/17   Lisa Roca, MD  predniSONE (DELTASONE) 20 MG tablet Take 2 tablets (40 mg total) daily by mouth. Leach not taking:  Reported on 07/18/2017 04/16/17   Harvest Dark, MD    Allergies  Allergen Reactions  . Dilantin [Phenytoin Sodium Extended] Other (See Comments)  . Penicillins Other (See Comments)    Has Leach had a PCN reaction causing immediate rash, facial/tongue/throat swelling, SOB or lightheadedness with hypotension: Unknown Has Leach had a PCN reaction causing severe rash involving mucus membranes or skin necrosis: Unknown Has Leach had a PCN reaction that required hospitalization: Unknown Has Leach had a PCN reaction occurring within the last 10 years: Unknown If all of the above answers are "NO", then may proceed with Cephalosporin use.  . Tegretol [Carbamazepine] Other (See Comments)    No family history on file.  Social History Social History   Tobacco Use  . Smoking status: Former Smoker    Packs/day: 0.50    Types: Cigarettes    Last attempt to quit: 10/12/2014    Years since quitting: 2.7  . Smokeless tobacco: Never Used  Substance Use Topics  . Alcohol use: No  . Drug use: No    Review of Systems  Constitutional: Negative for fever. Eyes: Negative for visual changes. ENT: Negative for sore throat. Cardiovascular: Negative for chest pain. Respiratory: Negative for shortness of breath. Gastrointestinal: Negative for abdominal pain, vomiting and diarrhea. Genitourinary: Negative for dysuria. Musculoskeletal: Negative for back pain. Skin: Negative for rash. Neurological: Negative for headache.  He feels slightly lightheaded as per HPI.  No focal weakness or numbness.  No confusion or altered mental status.  ____________________________________________   PHYSICAL EXAM:  VITAL SIGNS: ED Triage Vitals  Enc Vitals Group     BP 07/18/17 0713 133/89     Pulse Rate 07/18/17 0713 64     Resp 07/18/17 0713 20     Temp 07/18/17 0713 97.7 F (36.5 C)     Temp Source 07/18/17 0713 Oral     SpO2 07/18/17 0713 97 %     Weight 07/18/17 0710 210 lb (95.3 kg)      Height --      Head Circumference --      Peak Flow --      Pain Score --      Pain Loc --      Pain Edu? --      Excl. in [place]? --      Constitutional: Alert and oriented. Well appearing and in no distress. HEENT   Head: Normocephalic and atraumatic.      Eyes: Conjunctivae are normal. Pupils equal and round.       Ears:         Nose: No congestion/rhinnorhea.   Mouth/Throat: Mucous membranes are moist.   Neck: No stridor.  Supple, nontender.  No stiff neck. Cardiovascular/Chest: Normal rate, regular rhythm.  No murmurs, rubs, or gallops. Respiratory: Normal respiratory effort without tachypnea nor retractions. Breath sounds are clear and equal bilaterally. No wheezes/rales/rhonchi. Gastrointestinal: Soft. No distention, no guarding, no rebound. Nontender.    Genitourinary/rectal:Deferred Musculoskeletal: Nontender with normal range of motion in all extremities. No joint effusions.  No lower extremity tenderness.  No edema. Neurologic:  Normal speech and language. No gross or focal neurologic deficits are appreciated. Skin:  Skin is warm, dry and intact. No rash noted. Psychiatric: Mood and affect are normal. Speech and behavior are normal. Leach exhibits appropriate insight and judgment.   ____________________________________________  LABS (pertinent positives/negatives) I, Lisa Roca, MD the attending physician have reviewed the labs noted below.  Labs Reviewed  COMPREHENSIVE METABOLIC PANEL - Abnormal; Notable for the following components:      Result Value   CO2 21 (*)    Glucose, Bld 102 (*)    All other components within normal limits  CBC WITH DIFFERENTIAL/PLATELET - Abnormal; Notable for the following components:   RDW 14.9 (*)    All other components within normal limits  URINALYSIS, COMPLETE (UACMP) WITH MICROSCOPIC - Abnormal; Notable for the following components:   Color, Urine YELLOW (*)    APPearance CLEAR (*)    All other components within  normal limits  URINE DRUG SCREEN, QUALITATIVE ([facility] ONLY) - Abnormal; Notable for the following components:   Cannabinoid 50 Ng, Ur  POSITIVE (*)    All other components within normal limits     ____________________________________________  RADIOLOGY All Xrays were viewed by me.  Imaging interpreted by Radiologist, and I, Lisa Roca, MD the attending physician have reviewed the radiologist interpretation noted below.  None __________________________________________  PROCEDURES  Procedure(s) performed: None  Critical Care performed: None   ____________________________________________  ED COURSE / ASSESSMENT AND PLAN  Pertinent labs & imaging results that were available during my care of the Leach were reviewed by me and considered in my medical decision making (see chart for details).    Leach is overall well-appearing.  It sound like he came in in order to get the Keppra dose and refill because he was concerned that symptoms this morning of lightheadedness might mean he was about to have  a seizure.  He has not had a seizure.  He has been off of medications and gives me his up previous dose.  He does need referral to new primary care doctor, and I have given him the low cost resource options here in [facility] including the [facility], community health clinics as well as [facility], as well as discussed with them that they could try [place] care as well.  He is overall well-appearing.  We will give him an oral loading dose of 1500 mg Keppra and restart him on his previous home dosage, we discussed potentially starting lower and working back up to the previous dose.  From the standpoint of lightheadedness, he is not having any focal neurologic deficits based on history or on exam.  He has not had a headache.  I do not have a high suspicious for intracranial emergency and I am not recommending head imaging at this point time.  DIFFERENTIAL DIAGNOSIS:  Including but not limited to viral syndrome, seizure prodrome, electrolyte disturbance, dehydration, neurologic emergency, etc.  CONSULTATIONS:   None   Leach / Family / Caregiver informed of clinical course, medical decision-making process, and agree with plan.   I discussed return precautions, follow-up instructions, and discharge instructions with Leach and/or family.  Discharge Instructions : You are evaluated for lightheadedness, and although no return to the emergency room immediately for any seizure that is unusual for you, or last longer than 5 minutes, or any other symptoms concerning to you.  Return for any headache or confusion altered mental status, dizziness or passing out, heart palpitations or chest pain, weakness, numbness, or any other symptoms concerning to you.    ___________________________________________   FINAL CLINICAL IMPRESSION(S) / ED DIAGNOSES   Final diagnoses:  Lightheaded  Medication refill      ___________________________________________        Note: This dictation was prepared with Dragon dictation. Any transcriptional errors that result from this process are unintentional    Lisa Roca, MD 07/18/17 1148

## 2017-07-18 NOTE — Discharge Instructions (Signed)
You are evaluated for lightheadedness, and although no return to the emergency room immediately for any seizure that is unusual for you, or last longer than 5 minutes, or any other symptoms concerning to you.  Return for any headache or confusion altered mental status, dizziness or passing out, heart palpitations or chest pain, weakness, numbness, or any other symptoms concerning to you.

## 2017-08-04 ENCOUNTER — Emergency Department
Admission: EM | Admit: 2017-08-04 | Discharge: 2017-08-04 | Disposition: A | Discharge status: Home / Self Care | Payer: Self-pay | Attending: Emergency Medicine | Admitting: Emergency Medicine

## 2017-08-04 ENCOUNTER — Other Ambulatory Visit: Payer: Self-pay

## 2017-08-04 DIAGNOSIS — F17200 Nicotine dependence, unspecified, uncomplicated: Secondary | ICD-10-CM

## 2017-08-04 DIAGNOSIS — N289 Disorder of kidney and ureter, unspecified: Secondary | ICD-10-CM | POA: Insufficient documentation

## 2017-08-04 DIAGNOSIS — F17210 Nicotine dependence, cigarettes, uncomplicated: Secondary | ICD-10-CM | POA: Insufficient documentation

## 2017-08-04 DIAGNOSIS — J4541 Moderate persistent asthma with (acute) exacerbation: Secondary | ICD-10-CM | POA: Insufficient documentation

## 2017-08-04 DIAGNOSIS — R569 Unspecified convulsions: Secondary | ICD-10-CM | POA: Insufficient documentation

## 2017-08-04 LAB — URINALYSIS, COMPLETE (UACMP) WITH MICROSCOPIC
BACTERIA UA: NONE SEEN
Bilirubin Urine: NEGATIVE
Glucose, UA: NEGATIVE mg/dL
Hgb urine dipstick: NEGATIVE
Ketones, ur: NEGATIVE mg/dL
Leukocytes, UA: NEGATIVE
Nitrite: NEGATIVE
PROTEIN: NEGATIVE mg/dL
SPECIFIC GRAVITY, URINE: 1.012 (ref 1.005–1.030)
SQUAMOUS EPITHELIAL / LPF: NONE SEEN
WBC UA: NONE SEEN WBC/hpf (ref 0–5)
pH: 7 (ref 5.0–8.0)

## 2017-08-04 LAB — COMPREHENSIVE METABOLIC PANEL
ALK PHOS: 55 U/L (ref 38–126)
ALT: 20 U/L (ref 17–63)
AST: 22 U/L (ref 15–41)
Albumin: 4.2 g/dL (ref 3.5–5.0)
Anion gap: 7 (ref 5–15)
BUN: 17 mg/dL (ref 6–20)
CHLORIDE: 107 mmol/L (ref 101–111)
CO2: 22 mmol/L (ref 22–32)
CREATININE: 1.32 mg/dL — AB (ref 0.61–1.24)
Calcium: 9 mg/dL (ref 8.9–10.3)
GFR calc non Af Amer: >60 mL/min (ref >60)
GLUCOSE: 100 mg/dL — AB (ref 65–99)
Potassium: 4.2 mmol/L (ref 3.5–5.1)
SODIUM: 136 mmol/L (ref 135–145)
Total Bilirubin: 0.6 mg/dL (ref 0.3–1.2)
Total Protein: 6.9 g/dL (ref 6.5–8.1)

## 2017-08-04 LAB — CBC WITH DIFFERENTIAL/PLATELET
BASOS ABS: 0.1 10*3/uL (ref 0–0.1)
Basophils Relative: 1 %
Eosinophils Absolute: 0.5 10*3/uL (ref 0–0.7)
Eosinophils Relative: 8 %
HCT: 51.9 % (ref 40.0–52.0)
HEMOGLOBIN: 17.2 g/dL (ref 13.0–18.0)
LYMPHS ABS: 1.2 10*3/uL (ref 1.0–3.6)
LYMPHS PCT: 19 %
MCH: 29.9 pg (ref 26.0–34.0)
MCHC: 33.2 g/dL (ref 32.0–36.0)
MCV: 90.2 fL (ref 80.0–100.0)
Monocytes Absolute: 0.5 10*3/uL (ref 0.2–1.0)
Monocytes Relative: 8 %
NEUTROS PCT: 64 %
Neutro Abs: 4.1 10*3/uL (ref 1.4–6.5)
Platelets: 224 10*3/uL (ref 150–440)
RBC: 5.75 MIL/uL (ref 4.40–5.90)
RDW: 14.3 % (ref 11.5–14.5)
WBC: 6.5 10*3/uL (ref 3.8–10.6)

## 2017-08-04 LAB — MAGNESIUM: Magnesium: 1.8 mg/dL (ref 1.7–2.4)

## 2017-08-04 MED ORDER — PREDNISONE 20 MG PO TABS: 60.0000 mg | ORAL_TABLET | Freq: Every day | ORAL | 0 refills | Status: DC

## 2017-08-04 MED ORDER — SODIUM CHLORIDE 0.9 % IV BOLUS (SEPSIS)
1000.0000 mL | Freq: Once | INTRAVENOUS | Status: AC
  Administered 2017-08-04: 1000 mL via INTRAVENOUS

## 2017-08-04 MED ORDER — LEVETIRACETAM 500 MG PO TABS
1000.0000 mg | ORAL_TABLET | Freq: Once | ORAL | Status: AC
  Administered 2017-08-04: 1000 mg via ORAL
  Filled 2017-08-04: qty 2

## 2017-08-04 MED ORDER — IPRATROPIUM-ALBUTEROL 0.5-2.5 (3) MG/3ML IN SOLN
3.0000 mL | Freq: Once | RESPIRATORY_TRACT | Status: AC
  Administered 2017-08-04: 3 mL via RESPIRATORY_TRACT
  Filled 2017-08-04: qty 3

## 2017-08-04 MED ORDER — PREDNISONE 20 MG PO TABS
60.0000 mg | ORAL_TABLET | Freq: Once | ORAL | Status: AC
  Administered 2017-08-04: 60 mg via ORAL
  Filled 2017-08-04: qty 3

## 2017-08-04 NOTE — ED Notes (Signed)
Pt ambulatory without difficulty. VSS. NAD. Discharge instructions, RX and follow up reviewed. All questions answered.

## 2017-08-04 NOTE — ED Provider Notes (Addendum)
[facility] Emergency Department Provider Note  ____________________________________________  Time seen: Approximately 8:32 AM  I have reviewed the triage vital signs and the nursing notes.   HISTORY  Chief Complaint Seizures    HPI Shane Leach is a 32 y.o. male with a history of seizure disorder on Keppra, asthma and ongoing tobacco abuse, presenting for possible seizure.  The patient does not remember what happened, but his wife reports that the alarm was going off and she was trying to wake him up but he was having "gurgling" and did not respond to her despite the fact that she was shaking him.  After 15-20 minutes, he did start to answer basic questions and has now returned to completely normal baseline.  The patient has not had any recent illness including cough or cold symptoms, nausea vomiting or diarrhea, fevers or chills.  He has been unable to take his Keppra for at least 2 weeks due to being unable to pay for it.  He has had his neurologic workup at [facility] but states that it is too expensive to continue being seen there.  On my examination, he does have some wheezing, and states that he uses a nebulizer treatment at home but is not on any daily medications for his moderate asthma.  He denies any acute shortness of breath today.  Past Medical History:  Diagnosis Date  . Asthma   . Bronchitis   . Epilepsy ([place])   . Seizures [facility])     Patient Active Problem List   Diagnosis Date Noted  . Asthma exacerbation 11/17/2016    Past Surgical History:  Procedure Laterality Date  . testicular cancer     right testicle removed    Current Outpatient Rx  . Order #: [id number] Class: Print  . Order #: [id number] Class: Print  . Order #: [id number] Class: Print  . Order #: [id number] Class: Print    Allergies Dilantin [phenytoin sodium extended]; Penicillins; and Tegretol [carbamazepine]  No family history on file.  Social History Social History    Tobacco Use  . Smoking status: Former Smoker    Packs/day: 0.50    Types: Cigarettes    Last attempt to quit: 10/12/2014    Years since quitting: 2.8  . Smokeless tobacco: Never Used  Substance Use Topics  . Alcohol use: No  . Drug use: No    Review of Systems Constitutional: No fever/chills.  Lightheadedness or syncope. Eyes: No visual changes.  No eye discharge. ENT: No sore throat. No congestion or rhinorrhea.  No ear pain. Cardiovascular: Denies chest pain. Denies palpitations. Respiratory: Denies shortness of breath.  No cough.  Positive wheezing. Gastrointestinal: No abdominal pain.  No nausea, no vomiting.  No diarrhea.  No constipation. Genitourinary: Negative for dysuria. Musculoskeletal: Negative for back pain. Skin: Negative for rash. Neurological: Negative for headaches. No focal numbness, tingling or weakness.  Positive for decreased responsiveness, possible seizure.    ____________________________________________   PHYSICAL EXAM:  VITAL SIGNS: ED Triage Vitals  Enc Vitals Group     BP 08/04/17 0615 (!) 157/110     Pulse Rate 08/04/17 0615 70     Resp 08/04/17 0615 18     Temp 08/04/17 0615 97.8 F (36.6 C)     Temp Source 08/04/17 0615 Oral     SpO2 08/04/17 0615 100 %     Weight 08/04/17 0614 240 lb (108.9 kg)     Height 08/04/17 0614 6' (1.829 m)     Head Circumference --  Peak Flow --      Pain Score 08/04/17 0615 10     Pain Loc --      Pain Edu? --      Excl. in [place]? --     Constitutional: Alert and oriented.Answers questions appropriately.  Vital signs are stable.  The patient is comfortable and in no acute distress. Eyes: Conjunctivae are normal.  EOMI. No scleral icterus.  No eye discharge. Head: Atraumatic. Nose: No congestion/rhinnorhea. Mouth/Throat: Mucous membranes are moist.  Neck: No stridor.  Supple.  No JVD.  No meningismus. Cardiovascular: Slow rate, regular rhythm. No murmurs, rubs or gallops.  Respiratory: Normal  respiratory effort.  No accessory muscle use or retractions.  The patient has expiratory greater than inspiratory wheezing without any rales or rhonchi.  He is able to speak in full sentences without difficulty.   Gastrointestinal: Soft, nontender and nondistended.  No guarding or rebound.  No peritoneal signs. Musculoskeletal: No LE edema. No ttp in the calves or palpable cords.  Negative Homan's sign. Neurologic:  A&Ox3.  Speech is clear.  Face and smile are symmetric.  EOMI.  Moves all extremities well. Skin:  Skin is warm, dry and intact. No rash noted. Psychiatric: Mood and affect are normal. Speech and behavior are normal.  Normal judgement.  ____________________________________________   LABS (all labs ordered are listed, but only abnormal results are displayed)  Labs Reviewed  COMPREHENSIVE METABOLIC PANEL - Abnormal; Notable for the following components:      Result Value   Glucose, Bld 100 (*)    Creatinine, Ser 1.32 (*)    All other components within normal limits  CBC WITH DIFFERENTIAL/PLATELET  MAGNESIUM  URINALYSIS, COMPLETE (UACMP) WITH MICROSCOPIC   ____________________________________________  EKG  ED ECG REPORT I, Eula Listen, the attending physician, personally viewed and interpreted this ECG.   Date: 08/04/2017  EKG Time: 621  Rate: 64  Rhythm: normal sinus rhythm  Axis: leftward  Intervals:none  ST&T Change: No STEMI  ____________________________________________  RADIOLOGY  No results found.  ____________________________________________   PROCEDURES  Procedure(s) performed: None  Procedures  Critical Care performed: No ____________________________________________   INITIAL IMPRESSION / ASSESSMENT AND PLAN / ED COURSE  Pertinent labs & imaging results that were available during my care of the patient were reviewed by me and considered in my medical decision making (see chart for details).  32 y.o. male with a history of  seizure disorder on Keppra, asthma with ongoing tobacco abuse, presenting for possible seizure.  Overall, the patient does have some sinus bradycardia on the monitor, but this is likely within his normal limits.  I do not see any abnormalities on his EKG.  He does have regular breakthrough seizures, usually 2-3 times per month, and this episode is typical of his prior seizures and likely due to medication noncompliance.  At this time, the patient has returned to normal mental status and baseline.  He has reassuring laboratory studies including normal electrolytes and blood counts.  I am waiting for his urinalysis.  I will treat the patient for his wheezing, with a DuoNeb and initiate him on a 5-day course of steroids.  He has no recent infectious symptoms so antibiotics are not indicated.  I have had a long discussion with the patient and his wife about the importance of neurology as well as PCP follow-up.  He does know that his creatinine is elevated today, that he will receive intravenous fluids here, but will need to hydrate well at home and  follow-up with a primary care physician for this as well.  I anticipate discharge home.  Return precautions were discussed.  ----------------------------------------- 11:13 AM on 08/04/2017 -----------------------------------------  The patient has received intravenous fluids and is stable for discharge at this time.  He was unable to provide a urine but has not been having any infectious symptoms.  ____________________________________________  FINAL CLINICAL IMPRESSION(S) / ED DIAGNOSES  Final diagnoses:  Seizure ([place])  Acute renal insufficiency  Moderate persistent asthma with exacerbation  Smoking         NEW MEDICATIONS STARTED DURING THIS VISIT:  New Prescriptions   No medications on file      Eula Listen, MD 08/04/17 8022    Eula Listen, MD 08/04/17 1113

## 2017-08-04 NOTE — ED Notes (Signed)
Seizure pads applied to bed.  

## 2017-08-04 NOTE — ED Notes (Signed)
Dr. Mariea Clonts in room to see patient

## 2017-08-04 NOTE — ED Triage Notes (Signed)
Pt states history of epilepsy. Pt states he has missed several doses of keppra and "I think I had a seizure". Pt denies oral trauma or loss of bowel or bladder. Pt complains of "achy" all over. Pt states he has been out of keppra for a week.

## 2017-08-04 NOTE — ED Notes (Signed)
Pt aware that we need a UA, he is drinking fluids and has an NS hanging at this time.

## 2017-08-04 NOTE — Discharge Instructions (Signed)
Please restart your Keppra as previously prescribed.  Avoid sleep deprivation, alcohol, or other triggers for seizure.  This establish a primary care physician who can manage all of your chronic illnesses, and help you with stopping smoking.  Return to the emergency department if you develop seizure, severe headache, fever, shortness of breath, fainting, or any other symptoms concerning to you.

## 2017-08-20 ENCOUNTER — Encounter: Payer: Self-pay | Admitting: Emergency Medicine

## 2017-08-20 ENCOUNTER — Other Ambulatory Visit: Payer: Self-pay

## 2017-08-20 ENCOUNTER — Emergency Department
Admission: EM | Admit: 2017-08-20 | Discharge: 2017-08-20 | Disposition: A | Discharge status: Home / Self Care | Payer: Self-pay | Attending: Emergency Medicine | Admitting: Emergency Medicine

## 2017-08-20 DIAGNOSIS — R569 Unspecified convulsions: Secondary | ICD-10-CM | POA: Insufficient documentation

## 2017-08-20 DIAGNOSIS — Z87891 Personal history of nicotine dependence: Secondary | ICD-10-CM | POA: Insufficient documentation

## 2017-08-20 DIAGNOSIS — J45909 Unspecified asthma, uncomplicated: Secondary | ICD-10-CM | POA: Insufficient documentation

## 2017-08-20 DIAGNOSIS — Z79899 Other long term (current) drug therapy: Secondary | ICD-10-CM | POA: Insufficient documentation

## 2017-08-20 DIAGNOSIS — G40909 Epilepsy, unspecified, not intractable, without status epilepticus: Secondary | ICD-10-CM | POA: Insufficient documentation

## 2017-08-20 LAB — COMPREHENSIVE METABOLIC PANEL
ALT: 18 U/L (ref 17–63)
ANION GAP: 11 (ref 5–15)
AST: 23 U/L (ref 15–41)
Albumin: 4.5 g/dL (ref 3.5–5.0)
Alkaline Phosphatase: 65 U/L (ref 38–126)
BUN: 9 mg/dL (ref 6–20)
CHLORIDE: 106 mmol/L (ref 101–111)
CO2: 21 mmol/L — AB (ref 22–32)
CREATININE: 1.21 mg/dL (ref 0.61–1.24)
Calcium: 9.5 mg/dL (ref 8.9–10.3)
Glucose, Bld: 101 mg/dL — ABNORMAL HIGH (ref 65–99)
POTASSIUM: 3.8 mmol/L (ref 3.5–5.1)
SODIUM: 138 mmol/L (ref 135–145)
Total Bilirubin: 1.5 mg/dL — ABNORMAL HIGH (ref 0.3–1.2)
Total Protein: 7.7 g/dL (ref 6.5–8.1)

## 2017-08-20 LAB — CBC
HCT: 49.3 % (ref 40.0–52.0)
Hemoglobin: 16.8 g/dL (ref 13.0–18.0)
MCH: 30.2 pg (ref 26.0–34.0)
MCHC: 34.1 g/dL (ref 32.0–36.0)
MCV: 88.6 fL (ref 80.0–100.0)
PLATELETS: 227 10*3/uL (ref 150–440)
RBC: 5.57 MIL/uL (ref 4.40–5.90)
RDW: 14.3 % (ref 11.5–14.5)
WBC: 9.6 10*3/uL (ref 3.8–10.6)

## 2017-08-20 LAB — CK: Total CK: 259 U/L (ref 49–397)

## 2017-08-20 MED ORDER — LEVETIRACETAM 1000 MG PO TABS
1000.0000 mg | ORAL_TABLET | Freq: Three times a day (TID) | ORAL | 0 refills | Status: DC
Start: 2017-08-20 — End: 2017-08-20

## 2017-08-20 MED ORDER — LEVETIRACETAM IN NACL 1500 MG/100ML IV SOLN: 1500.0000 mg | Freq: Once | INTRAVENOUS | Status: DC

## 2017-08-20 MED ORDER — LEVETIRACETAM 1000 MG PO TABS: 1000.0000 mg | ORAL_TABLET | Freq: Three times a day (TID) | ORAL | 0 refills | Status: DC

## 2017-08-20 MED ORDER — KETOROLAC TROMETHAMINE 30 MG/ML IJ SOLN
30.0000 mg | Freq: Once | INTRAMUSCULAR | Status: AC
  Administered 2017-08-20: 30 mg via INTRAVENOUS

## 2017-08-20 MED ORDER — KETOROLAC TROMETHAMINE 30 MG/ML IJ SOLN
INTRAMUSCULAR | Status: AC
  Administered 2017-08-20: 30 mg via INTRAVENOUS
  Filled 2017-08-20: qty 1

## 2017-08-20 MED ORDER — SODIUM CHLORIDE 0.9 % IV SOLN
1500.0000 mg | Freq: Once | INTRAVENOUS | Status: AC
  Administered 2017-08-20: 1500 mg via INTRAVENOUS
  Filled 2017-08-20: qty 15

## 2017-08-20 NOTE — ED Notes (Signed)
Family at bedside. 

## 2017-08-20 NOTE — ED Provider Notes (Signed)
-----------------------------------------   8:48 AM on 08/20/2017 -----------------------------------------   Blood pressure 129/86, pulse 93, temperature 97.9 F (36.6 C), temperature source Oral, resp. rate 18, height 6' (1.829 m), weight 88.9 kg (196 lb), SpO2 92 %.  Assuming care from Dr. Owens Shark of STACY DESHLER is a 32 y.o. male with a chief complaint of Seizures .    Please refer to H&P by previous MD for further details.  The current plan of care is to f/u labs and reassess.  Labs WNL. Patient observed in the ED for 3 hours with no further seizure episodes. Has been loaded with Keppra. Seizure precautions have been discussed with patient. Prescriptions provided. Patient stable for dc at this time to care of wife.         Rudene Re, MD 08/20/17 [phone]

## 2017-08-20 NOTE — ED Notes (Signed)
Pt verbalizes understanding of d/c instructions, prescriptions and follow up 

## 2017-08-20 NOTE — ED Notes (Addendum)
Patient states hasn't had keppra in 2 weeks because he couldn't afford more than 1 weeks worth of meds. Seizure today. Patient states he does smoke marijuana frequently to help with his seizures.

## 2017-08-20 NOTE — Discharge Instructions (Addendum)
Seizures may happen at any time. It is important to take certain precautions to maintain your safety.   Follow up with your doctor in 1-3 days.  If you were started on a seizure medication, take it as prescribed.  During a seizure, a person may injure himself or herself. Seizure precautions are guidelines that a person can follow in order to minimize injury during a seizure. For any activity, it is important to ask, "What would happen if I had a seizure while doing this?" Follow the below precautions. YOU ARE NOT ALLOWED TO DRIVE UNTIL CLEARED BY YOUR NEUROLOGIST  Bathroom Safety  A person with seizures may want to shower instead of bathe to avoid accidental drowning. If falls occur during the patient's typical seizure, a person should use a shower seat, preferably one with a safety strap.  Use nonskid strips in your shower or tub.  Never use electrical equipment near water. This prevents accidental electrocution.  Consider changing glass in shower doors to shatterproof glass.  Risk analyst If possible, cook when someone else is nearby.  Use the back burners of the stove to prevent accidental burns.  Use shatterproof containers as much as possible. For instance, sauces can be transferred from glass bottles to plastic containers for use.  Limit time that is required using knives or other sharp objects. If possible, buy foods that are already cut, or ask someone to help in meal preparation.   General Safety at [place] not smoke or light fires in the fireplace unless someone else is present.  Do not use space heaters that can be accidentally overturned.  When alone, avoid using step stools or ladders, and do not clean rooftop gutters.  Purchase power tools and motorized Company secretary which have a safety switch that will stop the machine if you release the handle (a 'dead man's' switch).   Driving and Transportation Avoid driving unless your seizures are well controlled and/or you have  permission to drive from your state's Department of Motor Vehicles  [facility]). Each state has different laws. Please refer to the following link on the [place] website for more information: http://www.epilepsyfoundation.org/answerplace/Social/driving/drivingu.cfm  If you ride a bicycle, wear a helmet and any other necessary protective gear.  When taking public transportation like the bus or subway, stay clear of the platform edge.   Outdoor Insurance underwriter is okay, but does present certain risks. Never swim alone, and tell friends what to do if you have a seizure while swimming.  Wear appropriate protective equipment.  Ski with a friend. If a seizure occurs, your friend can seek help, if needed. He or she can also help to get you out of the cold. Consider using a safety hook or belt while riding the ski lift.

## 2017-08-20 NOTE — ED Provider Notes (Signed)
[facility] Emergency Department Provider Note   First MD Initiated Contact with Patient 08/20/17 [phone]     (approximate)  I have reviewed the triage vital signs and the nursing notes.   HISTORY  Chief Complaint Seizures    HPI Shane Leach is a 32 y.o. male with below list of chronic medical conditions including epilepsy presents to the emergency department following a witnessed seizure by his wife at home tonight.  Patient states that he is been unable to purchase his Keppra secondary to cost and has been without it for the past 2 weeks.  Patient denies any recent illness.  Patient denies any fever.   Past Medical History:  Diagnosis Date  . Asthma   . Bronchitis   . Epilepsy ([place])   . Seizures [facility])     Patient Active Problem List   Diagnosis Date Noted  . Asthma exacerbation 11/17/2016    Past Surgical History:  Procedure Laterality Date  . testicular cancer     right testicle removed    Prior to Admission medications   Medication Sig Start Date End Date Taking? Authorizing Provider  albuterol (PROVENTIL HFA;VENTOLIN HFA) 108 (90 Base) MCG/ACT inhaler Inhale 2 puffs into the lungs every 6 (six) hours as needed for wheezing or shortness of breath. 07/18/17   Lisa Roca, MD  levETIRAcetam (KEPPRA) 1000 MG tablet Take 1 tablet (1,000 mg total) by mouth 3 (three) times daily. 08/20/17   Gregor Hams, MD    Allergies Dilantin [phenytoin sodium extended]; Penicillins; and Tegretol [carbamazepine]  No family history on file.  Social History Social History   Tobacco Use  . Smoking status: Former Smoker    Packs/day: 0.50    Types: Cigarettes    Last attempt to quit: 10/12/2014    Years since quitting: 2.8  . Smokeless tobacco: Never Used  Substance Use Topics  . Alcohol use: No  . Drug use: No    Review of Systems Constitutional: No fever/chills Eyes: No visual changes. ENT: No sore throat. Cardiovascular: Denies chest  pain. Respiratory: Denies shortness of breath. Gastrointestinal: No abdominal pain.  No nausea, no vomiting.  No diarrhea.  No constipation. Genitourinary: Negative for dysuria. Musculoskeletal: Negative for neck pain.  Negative for back pain. Integumentary: Negative for rash. Neurological: Negative for headaches, focal weakness or numbness.   ____________________________________________   PHYSICAL EXAM:  VITAL SIGNS: ED Triage Vitals  Enc Vitals Group     BP 08/20/17 0558 (!) 126/100     Pulse Rate 08/20/17 0558 100     Resp 08/20/17 0558 18     Temp 08/20/17 0558 97.9 F (36.6 C)     Temp Source 08/20/17 0558 Oral     SpO2 08/20/17 0558 95 %     Weight 08/20/17 0559 88.9 kg (196 lb)     Height 08/20/17 0559 1.829 m (6')     Head Circumference --      Peak Flow --      Pain Score 08/20/17 0559 10     Pain Loc --      Pain Edu? --      Excl. in [place]? --     Constitutional: Alert and oriented. Well appearing and in no acute distress. Eyes: Conjunctivae are normal. PERRL. EOMI. Head: Atraumatic. Mouth/Throat: Mucous membranes are moist. Oropharynx non-erythematous. Neck: No stridor.   Cardiovascular: Normal rate, regular rhythm. Good peripheral circulation. Grossly normal heart sounds. Respiratory: Normal respiratory effort.  No retractions. Lungs CTAB.  Gastrointestinal: Soft and nontender. No distention.  Musculoskeletal: No lower extremity tenderness nor edema. No gross deformities of extremities. Neurologic:  Normal speech and language. No gross focal neurologic deficits are appreciated.  Skin:  Skin is warm, dry and intact. No rash noted. Psychiatric: Mood and affect are normal. Speech and behavior are normal.  ____________________________________________   LABS (all labs ordered are listed, but only abnormal results are displayed)  Labs Reviewed  CK  CBC  COMPREHENSIVE METABOLIC PANEL     _________________________ Procedures   ____________________________________________   INITIAL IMPRESSION / ASSESSMENT AND PLAN / ED COURSE  As part of my medical decision making, I reviewed the following data within the electronic MEDICAL RECORD NUMBER   32 year old male presented with above-stated history and physical exam seizure secondary to lack of ability to purchase Keppra.  I printed out a good Rx discount card for [place] for $14 and gave it to the patient which he states that he would be able to afford.  He was unaware of good Rx patient given Keppra 1500 mg IV in the emergency department      ____________________________________________  FINAL CLINICAL IMPRESSION(S) / ED DIAGNOSES  Final diagnoses:  Seizure ([place])  Seizure disorder ([place])     MEDICATIONS GIVEN DURING THIS VISIT:  Medications  levETIRAcetam (KEPPRA) 1,500 mg in sodium chloride 0.9 % 100 mL IVPB (0 mg Intravenous Stopped 08/20/17 0710)  ketorolac (TORADOL) 30 MG/ML injection 30 mg (30 mg Intravenous Given 08/20/17 0714)     ED Discharge Orders        Ordered    levETIRAcetam (KEPPRA) 1000 MG tablet  3 times daily,   Status:  Discontinued     08/20/17 0636    levETIRAcetam (KEPPRA) 1000 MG tablet  3 times daily     08/20/17 0636       Note:  This document was prepared using Dragon voice recognition software and may include unintentional dictation errors.    Gregor Hams, MD 08/20/17 [phone]

## 2017-08-20 NOTE — ED Triage Notes (Signed)
Pt to triage via w/c with no distress; st was on way to work (wife was driving) and had seizure PTA; not taking his keppra due to finances; st "my body hurts" but denies specific injuries

## 2017-11-11 ENCOUNTER — Emergency Department
Admission: EM | Admit: 2017-11-11 | Discharge: 2017-11-11 | Disposition: A | Discharge status: Home / Self Care | Payer: Self-pay | Attending: Emergency Medicine | Admitting: Emergency Medicine

## 2017-11-11 ENCOUNTER — Encounter: Payer: Self-pay | Admitting: Emergency Medicine

## 2017-11-11 DIAGNOSIS — R220 Localized swelling, mass and lump, head: Secondary | ICD-10-CM | POA: Insufficient documentation

## 2017-11-11 DIAGNOSIS — Z87891 Personal history of nicotine dependence: Secondary | ICD-10-CM | POA: Insufficient documentation

## 2017-11-11 DIAGNOSIS — J45909 Unspecified asthma, uncomplicated: Secondary | ICD-10-CM | POA: Insufficient documentation

## 2017-11-11 DIAGNOSIS — K047 Periapical abscess without sinus: Secondary | ICD-10-CM | POA: Insufficient documentation

## 2017-11-11 MED ORDER — CLINDAMYCIN HCL 150 MG PO CAPS: 300.0000 mg | ORAL_CAPSULE | Freq: Three times a day (TID) | ORAL | 0 refills | Status: DC

## 2017-11-11 MED ORDER — HYDROCODONE-ACETAMINOPHEN 5-325 MG PO TABS: 1.0000 | ORAL_TABLET | Freq: Four times a day (QID) | ORAL | 0 refills | Status: DC | PRN

## 2017-11-11 NOTE — Discharge Instructions (Addendum)
OPTIONS FOR DENTAL FOLLOW UP CARE ° °[place] Department of Health and Human Services - Local Safety Net Dental Clinics °http://www.ncdhhs.gov/dph/oralhealth/services/safetynetclinics.htm °  °[facility] (336-562-3123) ° °[facility] [place] ([phone]) ° °[facility] ([phone] ext [phone]) ° °[facility] ([phone]) ° °[facility] ([phone]) °This clinic caters to the indigent population and is on a lottery system. °Location: °[facility], [address], [place] °Clinic Hours: °Wednesdays from 6pm - 9pm, patients seen by a lottery system. °For dates, call or go to www.med.[facility].edu/[facility]/patients/Dental-[facility] °Services: °Cleanings, fillings and simple extractions. °Payment Options: °DENTAL WORK IS FREE OF CHARGE. Bring proof of income or support. °Best way to get seen: °Arrive at 5:15 pm - this is a lottery, NOT first come/first serve, so arriving earlier will not increase your chances of being seen. °  °  °[facility] °[phone] °Select option 1 for emergencies °  °Location: °[facility], [address], [place] °Clinic Hours: °No walk-ins accepted - call the day before to schedule an appointment. °Check in times are 9:30 am and 1:30 pm. °Services: °Simple extractions, temporary fillings, pulpectomy/pulp debridement, uncomplicated abscess drainage. °Payment Options: °PAYMENT IS DUE AT THE TIME OF SERVICE.  Fee is usually $100-200, additional surgical procedures (e.g. abscess drainage) may be extra. °Cash, checks, Visa/MasterCard accepted.  Can file Medicaid if patient is covered for dental - patient should call case worker to check. °No discount for [facility] patients. °Best way to get seen: °MUST call the day before and get onto the schedule. Can usually be seen the next 1-2 days. No walk-ins accepted. °  °  °[facility] °[phone] °   °Location: °[facility], [address], [place] °Clinic Hours: °M, W, Th, F 8am or 1:30pm, Tues 9a or 1:30 - first come/first served. °Services: °Simple extractions, temporary fillings, uncomplicated abscess drainage.  You do not need to be an [place] resident. °Payment Options: °PAYMENT IS DUE AT THE TIME OF SERVICE. °Dental insurance, otherwise sliding scale - bring proof of income or support. °Depending on income and treatment needed, cost is usually $50-200. °Best way to get seen: °Arrive early as it is first come/first served. °  °  °[facility] °[phone] °  °Location: °[address] °Clinic Hours: °Mon-Thu 8a-5p °Services: °Most basic dental services including extractions and fillings. °Payment Options: °PAYMENT IS DUE AT THE TIME OF SERVICE. °Sliding scale, up to 50% off - bring proof if income or support. °Medicaid with dental option accepted. °Best way to get seen: °Call to schedule an appointment, can usually be seen within 2 weeks OR they will try to see walk-ins - show up at 8a or 2p (you may have to wait). °  °  °[facility] °[phone] °[place] RESIDENTS ONLY °  °Location: °[facility], [address], [place], [place] [address] °Clinic Hours: By appointment only. °Monday - Thursday 8am-5pm, Friday 8am-12pm °Services: Cleanings, fillings, extractions. °Payment Options: °PAYMENT IS DUE AT THE TIME OF SERVICE. °Cash, Visa or MasterCard. Sliding scale - $30 minimum per service. °Best way to get seen: °Come in to office, complete packet and make an appointment - need proof of income °or support monies for each household member and proof of [place] residence. °Usually takes about a month to get in. °  °  °[facility] °[phone] °  °Location: °[address]   [place] °Clinic Hours: Walk-in Urgent Care Dental Services are offered Monday-Friday  mornings only. °The numbers of emergencies accepted daily is limited to the number of °providers available. °Maximum 15 - Mondays, Wednesdays & Thursdays °Maximum 10 - Tuesdays & Fridays °Services: °You do not need to be a [place] resident to be seen for a dental emergency. °Emergencies are defined as pain, swelling, abnormal bleeding, or dental trauma. Walkins will receive x-rays if needed. °NOTE: Dental cleaning is not an emergency. °Payment Options: °PAYMENT IS DUE AT THE TIME OF SERVICE. °Minimum co-pay is $40.00 for uninsured patients. °Minimum co-pay is $3.00 for Medicaid with dental coverage. °Dental Insurance is accepted and must be presented at time of visit. °Medicare does not cover dental. °Forms of payment: Cash, credit card, checks. °Best way to get seen: °If not previously registered with the clinic, walk-in dental registration begins at 7:15 am and is on a first come/first serve basis. °If previously registered with the clinic, call to make an appointment. °  °  °[facility] °[phone] °[place] RESIDENTS ONLY °  °Location: °[address], [place], [place] °Clinic Hours: °Mon-Thu 10a-2p °Services: Extractions only! °Payment Options: °FREE (donations accepted) - bring proof of income or support °Best way to get seen: °Call and schedule an appointment OR come at 8am on the 1st Monday of every month (except for holidays) when it is first come/first served. °  °  °[facility] °[phone] °  °Location: °[address], [place] °Clinic Hours: °Friday mornings °Services, Payment Options, Best way to get seen: °Call for info °

## 2017-11-11 NOTE — ED Provider Notes (Signed)
[facility] Emergency Department Provider Note  ____________________________________________   First MD Initiated Contact with Patient 11/11/17 1604     (approximate)  I have reviewed the triage vital signs and the nursing notes.   HISTORY  Chief Complaint Dental Pain    HPI Shane Leach is a 32 y.o. male presents emergency department complaining of left-sided lower jaw pain and swelling.  He states he has had increased swelling over the last 2 days.  States he is unsure if he has a broken tooth.  States the pain is 100 out of 10.  He denies fever chills.  He denies any injury.  Past Medical History:  Diagnosis Date  . Asthma   . Bronchitis   . Epilepsy ([place])   . Seizures [facility])     Patient Active Problem List   Diagnosis Date Noted  . Asthma exacerbation 11/17/2016    Past Surgical History:  Procedure Laterality Date  . testicular cancer     right testicle removed    Prior to Admission medications   Medication Sig Start Date End Date Taking? Authorizing Provider  clindamycin (CLEOCIN) 150 MG capsule Take 2 capsules (300 mg total) by mouth 3 (three) times daily. 11/11/17   Ardel Jagger, Linden Dolin, PA-C  HYDROcodone-acetaminophen (NORCO/VICODIN) 5-325 MG tablet Take 1 tablet by mouth every 6 (six) hours as needed for moderate pain. 11/11/17   Antoinette Haskett, Linden Dolin, PA-C  levETIRAcetam (KEPPRA) 1000 MG tablet Take 1 tablet (1,000 mg total) by mouth 3 (three) times daily. 08/20/17   Gregor Hams, MD    Allergies Dilantin [phenytoin sodium extended]; Penicillins; and Tegretol [carbamazepine]  No family history on file.  Social History Social History   Tobacco Use  . Smoking status: Former Smoker    Packs/day: 0.50    Types: Cigarettes    Last attempt to quit: 10/12/2014    Years since quitting: 3.0  . Smokeless tobacco: Never Used  Substance Use Topics  . Alcohol use: No  . Drug use: No    Types: Marijuana    Review of  Systems  Constitutional: No fever/chills Eyes: No visual changes. ENT: No sore throat.  Positive for dental pain and left-sided jaw swelling Respiratory: Denies cough Genitourinary: Negative for dysuria. Musculoskeletal: Negative for back pain. Skin: Negative for rash.    ____________________________________________   PHYSICAL EXAM:  VITAL SIGNS: ED Triage Vitals  Enc Vitals Group     BP 11/11/17 1557 130/83     Pulse Rate 11/11/17 1557 69     Resp --      Temp 11/11/17 1557 98.5 F (36.9 C)     Temp Source 11/11/17 1557 Oral     SpO2 11/11/17 1557 98 %     Weight 11/11/17 1553 205 lb (93 kg)     Height 11/11/17 1553 6' (1.829 m)     Head Circumference --      Peak Flow --      Pain Score 11/11/17 1554 10     Pain Loc --      Pain Edu? --      Excl. in [place]? --     Constitutional: Alert and oriented. Well appearing and in no acute distress. Eyes: Conjunctivae are normal.  Head: Atraumatic.  Positive for left-sided jaw swelling Nose: No congestion/rhinnorhea. Mouth/Throat: Mucous membranes are moist.  There are 2 greatly decayed teeth in the left lower jaw.  There is a fair amount of swelling at the gumline.  There is  no actual fluctuance or pustule noted. Cardiovascular: Normal rate, regular rhythm.  Heart sounds are normal Respiratory: Normal respiratory effort.  No retractions, lungs clear to auscultation GU: deferred Musculoskeletal: FROM all extremities, warm and well perfused Neurologic:  Normal speech and language.  Skin:  Skin is warm, dry and intact. No rash noted. Psychiatric: Mood and affect are normal. Speech and behavior are normal.  ____________________________________________   LABS (all labs ordered are listed, but only abnormal results are displayed)  Labs Reviewed - No data to  display ____________________________________________   ____________________________________________  RADIOLOGY    ____________________________________________   PROCEDURES  Procedure(s) performed: No  Procedures    ____________________________________________   INITIAL IMPRESSION / ASSESSMENT AND PLAN / ED COURSE  Pertinent labs & imaging results that were available during my care of the patient were reviewed by me and considered in my medical decision making (see chart for details).  Patient is a 32 year old male presents emergency department complaining of left-sided jaw pain with swelling.  Symptoms have increased over the last 2 days.  Physical exam left jaw is swollen.  He has severe dental caries in the 2 left lower molars.  The remainder of the exam is unremarkable.  Plain findings to the patient.  He was given a prescription for clindamycin and Vicodin.  He is also to take ibuprofen.  Apply warm compress followed by ice to the left jaw.  If he is worsening he is to return emergency department.  A list of dental clinics was provided to the patient.  Is discharged in stable condition     As part of my medical decision making, I reviewed the following data within the [place] notes reviewed and incorporated, Notes from prior ED visits and [place] Controlled Substance Database  ____________________________________________   FINAL CLINICAL IMPRESSION(S) / ED DIAGNOSES  Final diagnoses:  Dental abscess      NEW MEDICATIONS STARTED DURING THIS VISIT:  New Prescriptions   CLINDAMYCIN (CLEOCIN) 150 MG CAPSULE    Take 2 capsules (300 mg total) by mouth 3 (three) times daily.   HYDROCODONE-ACETAMINOPHEN (NORCO/VICODIN) 5-325 MG TABLET    Take 1 tablet by mouth every 6 (six) hours as needed for moderate pain.     Note:  This document was prepared using Dragon voice recognition software and may include unintentional dictation errors.     Versie Starks, PA-C 11/11/17 1632    Carrie Mew, MD 11/12/17 [phone]

## 2017-11-11 NOTE — ED Notes (Signed)
See triage note  States he has a broke tooth on left lower  Developed increased and swelling over the past 2 days

## 2017-11-11 NOTE — ED Triage Notes (Signed)
Patient presents to the ED with left cheek swelling x  2 todays.  Patient states he has a broken tooth to his left lower jaw.  Patient is in no obvious distress at this time.

## 2017-12-17 ENCOUNTER — Emergency Department
Admission: EM | Admit: 2017-12-17 | Discharge: 2017-12-17 | Disposition: A | Discharge status: Home / Self Care | Payer: Self-pay | Attending: Emergency Medicine | Admitting: Emergency Medicine

## 2017-12-17 ENCOUNTER — Other Ambulatory Visit: Payer: Self-pay

## 2017-12-17 ENCOUNTER — Emergency Department: Payer: Self-pay

## 2017-12-17 DIAGNOSIS — Z8547 Personal history of malignant neoplasm of testis: Secondary | ICD-10-CM | POA: Insufficient documentation

## 2017-12-17 DIAGNOSIS — J45909 Unspecified asthma, uncomplicated: Secondary | ICD-10-CM | POA: Insufficient documentation

## 2017-12-17 DIAGNOSIS — F1210 Cannabis abuse, uncomplicated: Secondary | ICD-10-CM | POA: Insufficient documentation

## 2017-12-17 DIAGNOSIS — R0789 Other chest pain: Secondary | ICD-10-CM | POA: Insufficient documentation

## 2017-12-17 DIAGNOSIS — Z87891 Personal history of nicotine dependence: Secondary | ICD-10-CM | POA: Insufficient documentation

## 2017-12-17 LAB — CBC WITH DIFFERENTIAL/PLATELET
Basophils Absolute: 0 10*3/uL (ref 0–0.1)
Basophils Relative: 1 %
Eosinophils Absolute: 0.3 10*3/uL (ref 0–0.7)
Eosinophils Relative: 4 %
HEMATOCRIT: 48 % (ref 40.0–52.0)
HEMOGLOBIN: 16.6 g/dL (ref 13.0–18.0)
Lymphocytes Relative: 18 %
Lymphs Abs: 1.1 10*3/uL (ref 1.0–3.6)
MCH: 31.4 pg (ref 26.0–34.0)
MCHC: 34.5 g/dL (ref 32.0–36.0)
MCV: 91.1 fL (ref 80.0–100.0)
Monocytes Absolute: 0.5 10*3/uL (ref 0.2–1.0)
Monocytes Relative: 8 %
NEUTROS ABS: 4.5 10*3/uL (ref 1.4–6.5)
NEUTROS PCT: 69 %
PLATELETS: 212 10*3/uL (ref 150–440)
RBC: 5.27 MIL/uL (ref 4.40–5.90)
RDW: 14.6 % — ABNORMAL HIGH (ref 11.5–14.5)
WBC: 6.5 10*3/uL (ref 3.8–10.6)

## 2017-12-17 LAB — BASIC METABOLIC PANEL
Anion gap: 7 (ref 5–15)
BUN: 15 mg/dL (ref 6–20)
CALCIUM: 9.3 mg/dL (ref 8.9–10.3)
CHLORIDE: 109 mmol/L (ref 98–111)
CO2: 24 mmol/L (ref 22–32)
CREATININE: 1.06 mg/dL (ref 0.61–1.24)
Glucose, Bld: 100 mg/dL — ABNORMAL HIGH (ref 70–99)
Potassium: 4.2 mmol/L (ref 3.5–5.1)
SODIUM: 140 mmol/L (ref 135–145)

## 2017-12-17 LAB — TROPONIN I

## 2017-12-17 MED ORDER — KETOROLAC TROMETHAMINE 30 MG/ML IJ SOLN
30.0000 mg | Freq: Once | INTRAMUSCULAR | Status: AC
  Administered 2017-12-17: 30 mg via INTRAVENOUS

## 2017-12-17 NOTE — ED Triage Notes (Signed)
Cp to left ride of ribs radiating to left side of back. Sob. Started at [place], no hx. Pt alert and oriented X4, active, cooperative, pt in NAD. RR even and unlabored, color WNL.

## 2017-12-17 NOTE — Discharge Instructions (Addendum)
Please seek medical attention for any high fevers, chest pain, shortness of breath, change in behavior, persistent vomiting, bloody stool or any other new or concerning symptoms.  

## 2017-12-17 NOTE — ED Provider Notes (Signed)
[facility] Emergency Department Provider Note   ____________________________________________   I have reviewed the triage vital signs and the nursing notes.   HISTORY  Chief Complaint Chest Pain   History limited by: Not Limited   HPI Shane Leach is a 32 y.o. male who presents to the emergency department today because of chest pain.  Is located in his left upper chest.  It started at 3 AM this morning.  It woke him from sleep.  He denies any radiation into the left arm or left neck.  He has not had any associated shortness of breath.  Denies any trauma or unusual activity yesterday.  Does have a history of seizures and states he does not consistently take his medication but does not feel he had a seizure last night. Denies similar pain in the past.   Per medical record review patient has a history of seizures.   Past Medical History:  Diagnosis Date  . Asthma   . Bronchitis   . Epilepsy ([place])   . Seizures [facility])     Patient Active Problem List   Diagnosis Date Noted  . Asthma exacerbation 11/17/2016    Past Surgical History:  Procedure Laterality Date  . testicular cancer     right testicle removed    Prior to Admission medications   Medication Sig Start Date End Date Taking? Authorizing Provider  clindamycin (CLEOCIN) 150 MG capsule Take 2 capsules (300 mg total) by mouth 3 (three) times daily. 11/11/17   Fisher, Linden Dolin, PA-C  HYDROcodone-acetaminophen (NORCO/VICODIN) 5-325 MG tablet Take 1 tablet by mouth every 6 (six) hours as needed for moderate pain. 11/11/17   Fisher, Linden Dolin, PA-C  levETIRAcetam (KEPPRA) 1000 MG tablet Take 1 tablet (1,000 mg total) by mouth 3 (three) times daily. 08/20/17   Gregor Hams, MD    Allergies Dilantin [phenytoin sodium extended]; Penicillins; and Tegretol [carbamazepine]  No family history on file.  Social History Social History   Tobacco Use  . Smoking status: Former Smoker    Packs/day:  0.50    Types: Cigarettes    Last attempt to quit: 10/12/2014    Years since quitting: 3.1  . Smokeless tobacco: Never Used  Substance Use Topics  . Alcohol use: No  . Drug use: No    Types: Marijuana    Review of Systems Constitutional: No fever/chills Eyes: No visual changes. ENT: No sore throat. Cardiovascular: Positive for left upper chest pain.  Respiratory: Denies shortness of breath. Gastrointestinal: No abdominal pain.  No nausea, no vomiting.  No diarrhea.   Genitourinary: Negative for dysuria. Musculoskeletal: Negative for back pain. Skin: Negative for rash. Neurological: Negative for headaches, focal weakness or numbness.  ____________________________________________   PHYSICAL EXAM:  VITAL SIGNS: ED Triage Vitals  Enc Vitals Group     BP 12/17/17 0850 123/68     Pulse Rate 12/17/17 0848 63     Resp 12/17/17 0848 18     Temp 12/17/17 0848 98.1 F (36.7 C)     Temp Source 12/17/17 0848 Oral     SpO2 12/17/17 0848 96 %     Weight 12/17/17 0848 240 lb (108.9 kg)     Height 12/17/17 0848 6' (1.829 m)     Head Circumference --      Peak Flow --      Pain Score 12/17/17 0850 10   Constitutional: Alert and oriented.  Eyes: Conjunctivae are normal.  ENT      Head:  Normocephalic and atraumatic.      Nose: No congestion/rhinnorhea.      Mouth/Throat: Mucous membranes are moist.      Neck: No stridor. Hematological/Lymphatic/Immunilogical: No cervical lymphadenopathy. Cardiovascular: Normal rate, regular rhythm.  No murmurs, rubs, or gallops.  Respiratory: Normal respiratory effort without tachypnea nor retractions. Breath sounds are clear and equal bilaterally. No wheezes/rales/rhonchi. Gastrointestinal: Soft and non tender. No rebound. No guarding.  Genitourinary: Deferred Musculoskeletal: Normal range of motion in all extremities. Tender to palpation over the left upper chest.  Neurologic:  Normal speech and language. No gross focal neurologic deficits are  appreciated.  Skin:  Skin is warm, dry and intact. No rash noted. Some bruising noted over left upper chest.  Psychiatric: Mood and affect are normal. Speech and behavior are normal. Patient exhibits appropriate insight and judgment.  ____________________________________________    LABS (pertinent positives/negatives)  Trop <0.03 bmp wnl excpet glu 100 CBC wbc 6.5, hgb 16.6, plt 212 ____________________________________________   EKG  I, Nance Pear, attending physician, personally viewed and interpreted this EKG  EKG Time: 0848 Rate: 55 Rhythm: sinus bradycardia Axis: left axis deviation Intervals: qtc 382 QRS: narrow ST changes: no st elevation Impression: abnormal ekg   ____________________________________________    RADIOLOGY  CXr No acute disease  ____________________________________________   PROCEDURES  Procedures  ____________________________________________   INITIAL IMPRESSION / ASSESSMENT AND PLAN / ED COURSE  Pertinent labs & imaging results that were available during my care of the patient were reviewed by me and considered in my medical decision making (see chart for details).   Presented to the emergency department today because of concerns for left upper chest pain.  On exam he does have tenderness over the left upper chest also noticed to have some bruising.  Out of abundance of caution blood work and chest x-ray were performed.  Neither showed concerning findings.  Discussed findings with patient.   ____________________________________________   FINAL CLINICAL IMPRESSION(S) / ED DIAGNOSES  Final diagnoses:  Chest wall pain     Note: This dictation was prepared with Dragon dictation. Any transcriptional errors that result from this process are unintentional     Nance Pear, MD 12/17/17 1113

## 2017-12-17 NOTE — ED Notes (Signed)
EKG completed and reviewed by Dr. Archie Balboa.

## 2017-12-17 NOTE — ED Notes (Signed)
First Nurse Note: Patient wakened by chest pain this AM at 0300.  Denies N&V or diaphoresis.

## 2018-01-01 ENCOUNTER — Emergency Department
Admission: EM | Admit: 2018-01-01 | Discharge: 2018-01-01 | Disposition: A | Discharge status: Home / Self Care | Payer: Self-pay | Attending: Emergency Medicine | Admitting: Emergency Medicine

## 2018-01-01 ENCOUNTER — Other Ambulatory Visit: Payer: Self-pay

## 2018-01-01 ENCOUNTER — Encounter: Payer: Self-pay | Admitting: Emergency Medicine

## 2018-01-01 DIAGNOSIS — R569 Unspecified convulsions: Secondary | ICD-10-CM | POA: Insufficient documentation

## 2018-01-01 DIAGNOSIS — G40909 Epilepsy, unspecified, not intractable, without status epilepticus: Secondary | ICD-10-CM | POA: Insufficient documentation

## 2018-01-01 DIAGNOSIS — J45909 Unspecified asthma, uncomplicated: Secondary | ICD-10-CM | POA: Insufficient documentation

## 2018-01-01 LAB — COMPREHENSIVE METABOLIC PANEL
ALT: 28 U/L (ref 0–44)
AST: 26 U/L (ref 15–41)
Albumin: 4.5 g/dL (ref 3.5–5.0)
Alkaline Phosphatase: 49 U/L (ref 38–126)
Anion gap: 7 (ref 5–15)
BUN: 23 mg/dL — ABNORMAL HIGH (ref 6–20)
CHLORIDE: 109 mmol/L (ref 98–111)
CO2: 28 mmol/L (ref 22–32)
CREATININE: 1.6 mg/dL — AB (ref 0.61–1.24)
Calcium: 9.5 mg/dL (ref 8.9–10.3)
GFR, EST NON AFRICAN AMERICAN: 57 mL/min — AB (ref >60)
Glucose, Bld: 102 mg/dL — ABNORMAL HIGH (ref 70–99)
Potassium: 5.3 mmol/L — ABNORMAL HIGH (ref 3.5–5.1)
Sodium: 144 mmol/L (ref 135–145)
Total Bilirubin: 0.9 mg/dL (ref 0.3–1.2)
Total Protein: 7.6 g/dL (ref 6.5–8.1)

## 2018-01-01 LAB — CBC
HCT: 50.5 % (ref 40.0–52.0)
Hemoglobin: 17.7 g/dL (ref 13.0–18.0)
MCH: 32.1 pg (ref 26.0–34.0)
MCHC: 35 g/dL (ref 32.0–36.0)
MCV: 91.7 fL (ref 80.0–100.0)
PLATELETS: 265 10*3/uL (ref 150–440)
RBC: 5.51 MIL/uL (ref 4.40–5.90)
RDW: 14.6 % — AB (ref 11.5–14.5)
WBC: 7.9 10*3/uL (ref 3.8–10.6)

## 2018-01-01 MED ORDER — LEVETIRACETAM IN NACL 1500 MG/100ML IV SOLN
1500.0000 mg | Freq: Once | INTRAVENOUS | Status: AC
  Administered 2018-01-01: 1500 mg via INTRAVENOUS
  Filled 2018-01-01: qty 100

## 2018-01-01 MED ORDER — LEVETIRACETAM 1000 MG PO TABS: 1000.0000 mg | ORAL_TABLET | Freq: Two times a day (BID) | ORAL | 2 refills | Status: DC

## 2018-01-01 NOTE — ED Notes (Signed)
Pt in NAD at time of departure, pt with friend. PT A&Ox4, in wheelchair to lobby with ride

## 2018-01-01 NOTE — ED Triage Notes (Addendum)
Pt to triage via w/c with no distress noted; st hx seizures, not taking meds because "they make him feel weak"; c/o pain "all over"

## 2018-01-01 NOTE — ED Provider Notes (Signed)
[facility] Emergency Department Provider Note    First MD Initiated Contact with Patient 01/01/18 [phone]     (approximate)  I have reviewed the triage vital signs and the nursing notes.   HISTORY  Chief Complaint Seizures    HPI Shane Leach is a 32 y.o. male list of chronic medical conditions including epilepsy presents to the emergency department following a witnessed generalized tonic-clonic seizure this morning.  Patient states that he also had a seizure yesterday.  Patient states that he has not taken his Keppra in approximately 3 months.  Patient states that he ran out however he is noncompliant taking it when he goes to work because it makes him feel tired".  Patient's significant other denies any head injury   Past Medical History:  Diagnosis Date  . Asthma   . Bronchitis   . Epilepsy ([place])   . Seizures [facility])     Patient Active Problem List   Diagnosis Date Noted  . Asthma exacerbation 11/17/2016    Past Surgical History:  Procedure Laterality Date  . testicular cancer     right testicle removed    Prior to Admission medications   Medication Sig Start Date End Date Taking? Authorizing Provider  clindamycin (CLEOCIN) 150 MG capsule Take 2 capsules (300 mg total) by mouth 3 (three) times daily. 11/11/17   Fisher, Linden Dolin, PA-C  HYDROcodone-acetaminophen (NORCO/VICODIN) 5-325 MG tablet Take 1 tablet by mouth every 6 (six) hours as needed for moderate pain. 11/11/17   Fisher, Linden Dolin, PA-C  levETIRAcetam (KEPPRA) 1000 MG tablet Take 1 tablet (1,000 mg total) by mouth 3 (three) times daily. 08/20/17   Gregor Hams, MD  levETIRAcetam (KEPPRA) 1000 MG tablet Take 1 tablet (1,000 mg total) by mouth 2 (two) times daily. 01/01/18   Gregor Hams, MD    Allergies Dilantin [phenytoin sodium extended]; Penicillins; and Tegretol [carbamazepine]  No family history on file.  Social History Social History   Tobacco Use  . Smoking  status: Former Smoker    Packs/day: 0.50    Types: Cigarettes    Last attempt to quit: 10/12/2014    Years since quitting: 3.2  . Smokeless tobacco: Never Used  Substance Use Topics  . Alcohol use: No  . Drug use: No    Types: Marijuana    Review of Systems Constitutional: No fever/chills Eyes: No visual changes. ENT: No sore throat. Cardiovascular: Denies chest pain. Respiratory: Denies shortness of breath. Gastrointestinal: No abdominal pain.  No nausea, no vomiting.  No diarrhea.  No constipation. Genitourinary: Negative for dysuria. Musculoskeletal: Negative for neck pain.  Negative for back pain. Integumentary: Negative for rash. Neurological: Negative for headaches, focal weakness or numbness.  Positive for witnessed seizure this morning  ____________________________________________   PHYSICAL EXAM:  VITAL SIGNS: ED Triage Vitals [01/01/18 0507]  Enc Vitals Group     BP (!) 153/109     Pulse Rate 80     Resp (!) 22     Temp 97.7 F (36.5 C)     Temp Source Oral     SpO2 99 %     Weight 108.9 kg (240 lb)     Height 1.829 m (6')     Head Circumference      Peak Flow      Pain Score 10     Pain Loc      Pain Edu?      Excl. in [place]?     Constitutional:  Alert and oriented. Well appearing and in no acute distress. Eyes: Conjunctivae are normal. PERRL. EOMI. Head: Atraumatic. Mouth/Throat: Mucous membranes are moist.  Oropharynx non-erythematous. Neck: No stridor.   Cardiovascular: Normal rate, regular rhythm. Good peripheral circulation. Grossly normal heart sounds. Respiratory: Normal respiratory effort.  No retractions. Lungs CTAB. Gastrointestinal: Soft and nontender. No distention.  Musculoskeletal: No lower extremity tenderness nor edema. No gross deformities of extremities. Neurologic:  Normal speech and language. No gross focal neurologic deficits are appreciated.  Skin:  Skin is warm, dry and intact. No rash noted. Psychiatric: Mood and affect are  normal. Speech and behavior are normal.  ________________________________   Procedures   ____________________________________________   INITIAL IMPRESSION / ASSESSMENT AND PLAN / ED COURSE  As part of my medical decision making, I reviewed the following data within the electronic MEDICAL RECORD NUMBER  32 year old male presents to the emergency department following witnessed tonic-clonic seizure.  Patient has been noncompliant with Keppra.  Patient given Keppra 1500 mg IV.  Will prescribe the patient's Keppra he states that he takes thousand milligrams 3 times daily. ____________________________________________  FINAL CLINICAL IMPRESSION(S) / ED DIAGNOSES  Final diagnoses:  Seizure disorder ([place])     MEDICATIONS GIVEN DURING THIS VISIT:  Medications  levETIRAcetam (KEPPRA) IVPB 1500 mg/ 100 mL premix (1,500 mg Intravenous New Bag/Given 01/01/18 0525)     ED Discharge Orders        Ordered    levETIRAcetam (KEPPRA) 1000 MG tablet  2 times daily     01/01/18 0537       Note:  This document was prepared using Dragon voice recognition software and may include unintentional dictation errors.    Gregor Hams, MD 01/01/18 [phone]

## 2018-01-01 NOTE — ED Notes (Signed)
Report to jordyn and [place], rns

## 2018-01-01 NOTE — ED Notes (Signed)
Pt awake awake and alert. Pt calling ride at this time.

## 2018-01-01 NOTE — ED Notes (Signed)
Pt sleeping, waiting on spouse for discharge.

## 2018-01-15 ENCOUNTER — Emergency Department
Admission: EM | Admit: 2018-01-15 | Discharge: 2018-01-15 | Disposition: A | Discharge status: Home / Self Care | Payer: Self-pay | Attending: Emergency Medicine | Admitting: Emergency Medicine

## 2018-01-15 ENCOUNTER — Encounter: Payer: Self-pay | Admitting: Emergency Medicine

## 2018-01-15 ENCOUNTER — Emergency Department: Payer: Self-pay

## 2018-01-15 DIAGNOSIS — J4541 Moderate persistent asthma with (acute) exacerbation: Secondary | ICD-10-CM | POA: Insufficient documentation

## 2018-01-15 DIAGNOSIS — Z8547 Personal history of malignant neoplasm of testis: Secondary | ICD-10-CM | POA: Insufficient documentation

## 2018-01-15 DIAGNOSIS — Z87891 Personal history of nicotine dependence: Secondary | ICD-10-CM | POA: Insufficient documentation

## 2018-01-15 LAB — BASIC METABOLIC PANEL
Anion gap: 6 (ref 5–15)
BUN: 12 mg/dL (ref 6–20)
CHLORIDE: 106 mmol/L (ref 98–111)
CO2: 27 mmol/L (ref 22–32)
CREATININE: 1.22 mg/dL (ref 0.61–1.24)
Calcium: 9.6 mg/dL (ref 8.9–10.3)
GFR calc Af Amer: >60 mL/min (ref >60)
GFR calc non Af Amer: >60 mL/min (ref >60)
Glucose, Bld: 117 mg/dL — ABNORMAL HIGH (ref 70–99)
Potassium: 3.8 mmol/L (ref 3.5–5.1)
SODIUM: 139 mmol/L (ref 135–145)

## 2018-01-15 LAB — CBC
HEMATOCRIT: 48.3 % (ref 40.0–52.0)
Hemoglobin: 16.5 g/dL (ref 13.0–18.0)
MCH: 30.9 pg (ref 26.0–34.0)
MCHC: 34.1 g/dL (ref 32.0–36.0)
MCV: 90.8 fL (ref 80.0–100.0)
PLATELETS: 245 10*3/uL (ref 150–440)
RBC: 5.33 MIL/uL (ref 4.40–5.90)
RDW: 14.5 % (ref 11.5–14.5)
WBC: 11.2 10*3/uL — AB (ref 3.8–10.6)

## 2018-01-15 LAB — TROPONIN I: Troponin I: <0.03 ng/mL (ref <0.03)

## 2018-01-15 MED ORDER — PREDNISONE 20 MG PO TABS
60.0000 mg | ORAL_TABLET | Freq: Once | ORAL | Status: AC
  Administered 2018-01-15: 60 mg via ORAL
  Filled 2018-01-15: qty 3

## 2018-01-15 MED ORDER — ALBUTEROL SULFATE (2.5 MG/3ML) 0.083% IN NEBU
5.0000 mg | INHALATION_SOLUTION | Freq: Once | RESPIRATORY_TRACT | Status: AC
  Administered 2018-01-15: 5 mg via RESPIRATORY_TRACT

## 2018-01-15 MED ORDER — ALBUTEROL SULFATE HFA 108 (90 BASE) MCG/ACT IN AERS: 2.0000 | INHALATION_SPRAY | Freq: Four times a day (QID) | RESPIRATORY_TRACT | 2 refills | Status: DC | PRN

## 2018-01-15 MED ORDER — PREDNISONE 50 MG PO TABS: ORAL_TABLET | ORAL | 0 refills | Status: DC

## 2018-01-15 MED ORDER — IPRATROPIUM-ALBUTEROL 0.5-2.5 (3) MG/3ML IN SOLN
3.0000 mL | Freq: Once | RESPIRATORY_TRACT | Status: AC
  Administered 2018-01-15: 3 mL via RESPIRATORY_TRACT
  Filled 2018-01-15: qty 3

## 2018-01-15 MED ORDER — IPRATROPIUM-ALBUTEROL 0.5-2.5 (3) MG/3ML IN SOLN: 3.0000 mL | Freq: Four times a day (QID) | RESPIRATORY_TRACT | 2 refills | Status: DC | PRN

## 2018-01-15 MED ORDER — ALBUTEROL SULFATE (2.5 MG/3ML) 0.083% IN NEBU
INHALATION_SOLUTION | RESPIRATORY_TRACT | Status: AC
  Administered 2018-01-15: 5 mg via RESPIRATORY_TRACT
  Filled 2018-01-15: qty 6

## 2018-01-15 NOTE — ED Notes (Signed)
Pt c/o congested cough causing chest and back pain - pt has been sick for 3 days - cough is nonproductive - pt has hx of asthma - denies fever/chills

## 2018-01-15 NOTE — ED Triage Notes (Signed)
Pt c/o cough with chest pain and shortness of breath x 3 days, has hx of asthma and bronchitis. Feels the same.

## 2018-01-15 NOTE — ED Notes (Signed)
Pt discharged home after verbalizing understanding of discharge instructions; nad noted. 

## 2018-01-15 NOTE — ED Provider Notes (Signed)
[facility] Emergency Department Provider Note       Time seen: ----------------------------------------- 5:41 PM on 01/15/2018 -----------------------------------------   I have reviewed the triage vital signs and the nursing notes.  HISTORY   Chief Complaint Chest Pain and Shortness of Breath    HPI Shane Leach is a 32 y.o. male with a history of asthma, bronchitis, epilepsy and who presents to the ED for shortness of breath.  Patient planes of cough with chest pain shortness of breath for the past 3 days.  He has a history of asthma bronchitis and states this feels like same.  Currently he does not have an inhaler to take at home.  He denies fevers, chills or other complaints.  Past Medical History:  Diagnosis Date  . Asthma   . Bronchitis   . Epilepsy ([place])   . Seizures [facility])     Patient Active Problem List   Diagnosis Date Noted  . Asthma exacerbation 11/17/2016    Past Surgical History:  Procedure Laterality Date  . testicular cancer     right testicle removed    Allergies Dilantin [phenytoin sodium extended]; Penicillins; and Tegretol [carbamazepine]  Social History Social History   Tobacco Use  . Smoking status: Former Smoker    Packs/day: 0.50    Types: Cigarettes    Last attempt to quit: 10/12/2014    Years since quitting: 3.2  . Smokeless tobacco: Never Used  Substance Use Topics  . Alcohol use: No  . Drug use: No    Types: Marijuana   Review of Systems Constitutional: Negative for fever. Cardiovascular: Positive for chest pain Respiratory: Positive shortness of breath Gastrointestinal: Negative for abdominal pain, vomiting and diarrhea. Musculoskeletal: Negative for back pain. Skin: Negative for rash. Neurological: Negative for headaches, focal weakness or numbness.  All systems negative/normal/unremarkable except as stated in the HPI  ____________________________________________   PHYSICAL EXAM:  VITAL  SIGNS: ED Triage Vitals [01/15/18 1512]  Enc Vitals Group     BP (!) 132/104     Pulse Rate (!) 101     Resp (!) 22     Temp      Temp src      SpO2 91 %     Weight 220 lb (99.8 kg)     Height 6' (1.829 m)     Head Circumference      Peak Flow      Pain Score 8     Pain Loc      Pain Edu?      Excl. in [place]?    Constitutional: Alert and oriented. Well appearing and in no distress. Eyes: Conjunctivae are normal. Normal extraocular movements. ENT   Head: Normocephalic and atraumatic.   Nose: No congestion/rhinnorhea.   Mouth/Throat: Mucous membranes are moist.   Neck: No stridor. Cardiovascular: Normal rate, regular rhythm. No murmurs, rubs, or gallops. Respiratory: Mild tachypnea with wheezing and rhonchi bilaterally Gastrointestinal: Soft and nontender. Normal bowel sounds Musculoskeletal: Nontender with normal range of motion in extremities. No lower extremity tenderness nor edema. Neurologic:  Normal speech and language. No gross focal neurologic deficits are appreciated.  Skin:  Skin is warm, dry and intact. No rash noted. Psychiatric: Mood and affect are normal. Speech and behavior are normal.  ____________________________________________  EKG: Interpreted by me.  Normal sinus rhythm rate 97 bpm, left axis deviation, normal QRS, normal QT  ____________________________________________  ED COURSE:  As part of my medical decision making, I reviewed the following data within  the electronic MEDICAL RECORD NUMBER History obtained from family if available, nursing notes, old chart and ekg, as well as notes from prior ED visits. Patient presented for chest pain shortness of breath, we will assess with labs and imaging as indicated at this time.   Procedures ____________________________________________   LABS (pertinent positives/negatives)  Labs Reviewed  BASIC METABOLIC PANEL - Abnormal; Notable for the following components:      Result Value   Glucose, Bld 117  (*)    All other components within normal limits  CBC - Abnormal; Notable for the following components:   WBC 11.2 (*)    All other components within normal limits  TROPONIN I    RADIOLOGY  Chest x-ray does not reveal any acute process  ____________________________________________  DIFFERENTIAL DIAGNOSIS   Asthma exacerbation, bronchitis, URI, pneumonia  FINAL ASSESSMENT AND PLAN  Asthma exacerbation   Plan: The patient had presented for acute asthma exacerbation. Patient's labs are reassuring. Patient's imaging not reveal any acute process.  Patient will be discharged with albuterol and steroids.  He is cleared for outpatient follow-up.   Laurence Aly, MD   Note: This note was generated in part or whole with voice recognition software. Voice recognition is usually quite accurate but there are transcription errors that can and very often do occur. I apologize for any typographical errors that were not detected and corrected.     Earleen [place], MD 01/15/18 [phone]

## 2018-04-06 ENCOUNTER — Encounter: Payer: Self-pay | Admitting: Emergency Medicine

## 2018-04-06 ENCOUNTER — Emergency Department
Admission: EM | Admit: 2018-04-06 | Discharge: 2018-04-06 | Disposition: A | Discharge status: Home / Self Care | Payer: Self-pay | Attending: Emergency Medicine | Admitting: Emergency Medicine

## 2018-04-06 DIAGNOSIS — Z87891 Personal history of nicotine dependence: Secondary | ICD-10-CM | POA: Insufficient documentation

## 2018-04-06 DIAGNOSIS — M545 Low back pain, unspecified: Secondary | ICD-10-CM

## 2018-04-06 DIAGNOSIS — Z8547 Personal history of malignant neoplasm of testis: Secondary | ICD-10-CM | POA: Insufficient documentation

## 2018-04-06 DIAGNOSIS — Z79899 Other long term (current) drug therapy: Secondary | ICD-10-CM | POA: Insufficient documentation

## 2018-04-06 DIAGNOSIS — K5909 Other constipation: Secondary | ICD-10-CM

## 2018-04-06 DIAGNOSIS — J45909 Unspecified asthma, uncomplicated: Secondary | ICD-10-CM | POA: Insufficient documentation

## 2018-04-06 DIAGNOSIS — K5900 Constipation, unspecified: Secondary | ICD-10-CM | POA: Insufficient documentation

## 2018-04-06 LAB — URINALYSIS, COMPLETE (UACMP) WITH MICROSCOPIC
BACTERIA UA: NONE SEEN
BILIRUBIN URINE: NEGATIVE
Glucose, UA: NEGATIVE mg/dL
Hgb urine dipstick: NEGATIVE
Ketones, ur: NEGATIVE mg/dL
Leukocytes, UA: NEGATIVE
Nitrite: NEGATIVE
Protein, ur: NEGATIVE mg/dL
SPECIFIC GRAVITY, URINE: 1.017 (ref 1.005–1.030)
WBC UA: NONE SEEN WBC/hpf (ref 0–5)
pH: 6 (ref 5.0–8.0)

## 2018-04-06 LAB — CBC
HCT: 49.5 % (ref 39.0–52.0)
HEMOGLOBIN: 16.3 g/dL (ref 13.0–17.0)
MCH: 30.6 pg (ref 26.0–34.0)
MCHC: 32.9 g/dL (ref 30.0–36.0)
MCV: 92.9 fL (ref 80.0–100.0)
NRBC: 0 % (ref 0.0–0.2)
Platelets: 237 10*3/uL (ref 150–400)
RBC: 5.33 MIL/uL (ref 4.22–5.81)
RDW: 13.5 % (ref 11.5–15.5)
WBC: 7.4 10*3/uL (ref 4.0–10.5)

## 2018-04-06 LAB — COMPREHENSIVE METABOLIC PANEL
ALBUMIN: 4.3 g/dL (ref 3.5–5.0)
ALT: 25 U/L (ref 0–44)
AST: 21 U/L (ref 15–41)
Alkaline Phosphatase: 48 U/L (ref 38–126)
Anion gap: 7 (ref 5–15)
BILIRUBIN TOTAL: 0.7 mg/dL (ref 0.3–1.2)
BUN: 24 mg/dL — AB (ref 6–20)
CO2: 23 mmol/L (ref 22–32)
CREATININE: 1.24 mg/dL (ref 0.61–1.24)
Calcium: 9.3 mg/dL (ref 8.9–10.3)
Chloride: 109 mmol/L (ref 98–111)
GFR calc Af Amer: >60 mL/min (ref >60)
GLUCOSE: 96 mg/dL (ref 70–99)
POTASSIUM: 4.7 mmol/L (ref 3.5–5.1)
Sodium: 139 mmol/L (ref 135–145)
TOTAL PROTEIN: 7.2 g/dL (ref 6.5–8.1)

## 2018-04-06 LAB — LIPASE, BLOOD: LIPASE: 40 U/L (ref 11–51)

## 2018-04-06 MED ORDER — POLYETHYLENE GLYCOL 3350 17 G PO PACK: 17.0000 g | PACK | Freq: Every day | ORAL | 0 refills | Status: DC

## 2018-04-06 MED ORDER — CYCLOBENZAPRINE HCL 5 MG PO TABS: ORAL_TABLET | ORAL | 0 refills | Status: DC

## 2018-04-06 MED ORDER — IBUPROFEN 600 MG PO TABS: 600.0000 mg | ORAL_TABLET | Freq: Four times a day (QID) | ORAL | 0 refills | Status: DC | PRN

## 2018-04-06 NOTE — ED Notes (Signed)
See triage note.  Presents with pain to lower back which started 2 days ago Pain is mainly to left lower back and non radiating  Denies any injury or urinary sxs'  Ambulates well to treatment room

## 2018-04-06 NOTE — ED Provider Notes (Signed)
[facility] Emergency Department Provider Note  ____________________________________________  Time seen: Approximately 8:18 AM  I have reviewed the triage vital signs and the nursing notes.   HISTORY  Chief Complaint Back Pain    HPI Shane Leach is a 32 y.o. male presents emergency department for evaluation of low back pain for 2 days.  Patient also states that he had some mild lower abdominal discomfort and one episode of vomiting yesterday.  Pain is over both sides of his back and not the center.  Pain does not radiate into his legs. He states that he has noticed over the last 3 months that he is still having normal bowel movements but he feels that he is straining more and they are harder in consistency than usual.   Last bowel movement was last night and was normal.  He states that he usually has 2 bowel movements per day.   He states that he has gained some weight recently and thinks this is contributing to his back pain.  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  He needs a note for work.   No urinary symptoms.  No saddle anesthesias.  No trauma.  No fever, chills, nausea.   Past Medical History:  Diagnosis Date  . Asthma   . Bronchitis   . Epilepsy ([place])   . Seizures [facility])     Patient Active Problem List   Diagnosis Date Noted  . Asthma exacerbation 11/17/2016    Past Surgical History:  Procedure Laterality Date  . testicular cancer     right testicle removed    Prior to Admission medications   Medication Sig Start Date End Date Taking? Authorizing Provider  albuterol (PROVENTIL HFA;VENTOLIN HFA) 108 (90 Base) MCG/ACT inhaler Inhale 2 puffs into the lungs every 6 (six) hours as needed for wheezing or shortness of breath. 01/15/18   Earleen Newport, MD  clindamycin (CLEOCIN) 150 MG capsule Take 2 capsules (300 mg total) by mouth 3 (three) times daily. 11/11/17   Fisher, Linden Dolin, PA-C  cyclobenzaprine (FLEXERIL) 5 MG tablet  Take 1-2 tablets 3 times daily as needed 04/06/18   Laban Emperor, PA-C  HYDROcodone-acetaminophen (NORCO/VICODIN) 5-325 MG tablet Take 1 tablet by mouth every 6 (six) hours as needed for moderate pain. 11/11/17   Fisher, Linden Dolin, PA-C  ibuprofen (ADVIL,MOTRIN) 600 MG tablet Take 1 tablet (600 mg total) by mouth every 6 (six) hours as needed. 04/06/18   Laban Emperor, PA-C  ipratropium-albuterol (DUONEB) 0.5-2.5 (3) MG/3ML SOLN Take 3 mLs by nebulization every 6 (six) hours as needed. 01/15/18   Earleen Newport, MD  levETIRAcetam (KEPPRA) 1000 MG tablet Take 1 tablet (1,000 mg total) by mouth 3 (three) times daily. 08/20/17   Gregor Hams, MD  levETIRAcetam (KEPPRA) 1000 MG tablet Take 1 tablet (1,000 mg total) by mouth 2 (two) times daily. 01/01/18   Gregor Hams, MD  polyethylene glycol [facility]) packet Take 17 g by mouth daily. 04/06/18   Laban Emperor, PA-C  predniSONE (DELTASONE) 50 MG tablet Take 1 tablet by mouth daily 01/15/18   Earleen Newport, MD    Allergies Dilantin [phenytoin sodium extended]; Penicillins; and Tegretol [carbamazepine]  No family history on file.  Social History Social History   Tobacco Use  . Smoking status: Former Smoker    Packs/day: 0.50    Types: Cigarettes    Last attempt to quit: 10/12/2014    Years since quitting: 3.4  .  Smokeless tobacco: Never Used  Substance Use Topics  . Alcohol use: Yes  . Drug use: Not Currently    Types: Marijuana     Review of Systems  Constitutional: No fever/chills Cardiovascular: No chest pain. Respiratory: No SOB. Gastrointestinal: Positive for abdominal discomfort.  Genitourinary: Negative for dysuria. Musculoskeletal: Positive for back pain.  Skin: Negative for rash, abrasions, lacerations, ecchymosis. Neurological: Negative for headaches, numbness or tingling   ____________________________________________   PHYSICAL EXAM:  VITAL SIGNS: ED Triage Vitals  Enc Vitals Group     BP 04/06/18  0641 134/79     Pulse Rate 04/06/18 0641 84     Resp 04/06/18 0641 18     Temp 04/06/18 0641 98.5 F (36.9 C)     Temp Source 04/06/18 0641 Oral     SpO2 04/06/18 0641 97 %     Weight 04/06/18 0643 240 lb (108.9 kg)     Height 04/06/18 0643 6' (1.829 m)     Head Circumference --      Peak Flow --      Pain Score 04/06/18 0643 10     Pain Loc --      Pain Edu? --      Excl. in [place]? --      Constitutional: Alert and oriented. Well appearing and in no acute distress. Eyes: Conjunctivae are normal. PERRL. EOMI. Head: Atraumatic. ENT:      Ears:      Nose: No congestion/rhinnorhea.      Mouth/Throat: Mucous membranes are moist.  Neck: No stridor.   Cardiovascular: Normal rate, regular rhythm.  Good peripheral circulation. Respiratory: Normal respiratory effort without tachypnea or retractions. Lungs CTAB. Good air entry to the bases with no decreased or absent breath sounds. Gastrointestinal: Bowel sounds 4 quadrants. Soft and nontender to palpation. No guarding or rigidity. No palpable masses. No distention. No CVA tenderness. Musculoskeletal: Full range of motion to all extremities. No gross deformities appreciated.  No tenderness to palpation of her lumbar spine.  Tenderness to palpation over lumbar paraspinal muscles. Neurologic:  Normal speech and language. No gross focal neurologic deficits are appreciated.  Skin:  Skin is warm, dry and intact. No rash noted. Psychiatric: Mood and affect are normal. Speech and behavior are normal. Patient exhibits appropriate insight and judgement.   ____________________________________________   LABS (all labs ordered are listed, but only abnormal results are displayed)  Labs Reviewed  URINALYSIS, COMPLETE (UACMP) WITH MICROSCOPIC - Abnormal; Notable for the following components:      Result Value   Color, Urine STRAW (*)    APPearance CLEAR (*)    All other components within normal limits  COMPREHENSIVE METABOLIC PANEL - Abnormal;  Notable for the following components:   BUN 24 (*)    All other components within normal limits  CBC  LIPASE, BLOOD   ____________________________________________  EKG   ____________________________________________  RADIOLOGY  No results found.  ____________________________________________    PROCEDURES  Procedure(s) performed:    Procedures    Medications - No data to display   ____________________________________________   INITIAL IMPRESSION / ASSESSMENT AND PLAN / ED COURSE  Pertinent labs & imaging results that were available during my care of the patient were reviewed by me and considered in my medical decision making (see chart for details).  Review of the [place] CSRS was performed in accordance of the [place] prior to dispensing any controlled drugs.   Patient's diagnosis is consistent with low back pain and constipation.  Vital signs  and exam are reassuring.  Patient appears comfortable and is talkative. Lab work largely unremarkable.  No infection on urinalysis.   Patient will be discharged home with prescriptions for Flexeril, Motrin, MiraLAX. Patient is to follow up with primary care as directed. Patient is given ED precautions to return to the ED for any worsening or new symptoms.     ____________________________________________  FINAL CLINICAL IMPRESSION(S) / ED DIAGNOSES  Final diagnoses:  Acute bilateral low back pain without sciatica  Chronic constipation      NEW MEDICATIONS STARTED DURING THIS VISIT:  ED Discharge Orders         Ordered    cyclobenzaprine (FLEXERIL) 5 MG tablet     04/06/18 0919    ibuprofen (ADVIL,MOTRIN) 600 MG tablet  Every 6 hours PRN     04/06/18 0919    polyethylene glycol (MIRALAX) packet  Daily     04/06/18 0919              This chart was dictated using voice recognition software/Dragon. Despite best efforts to proofread, errors can occur which can change the meaning. Any change was purely  unintentional.    Laban Emperor, PA-C 04/06/18 1534    Earleen Newport, MD 04/07/18 [phone]

## 2018-04-06 NOTE — ED Triage Notes (Signed)
Patient with complaint of constant  lower back pain times two days. Patient states that he has had some nausea. Denies urinary symptoms.

## 2018-04-15 ENCOUNTER — Encounter: Payer: Self-pay | Admitting: Emergency Medicine

## 2018-04-15 ENCOUNTER — Other Ambulatory Visit: Payer: Self-pay

## 2018-04-15 ENCOUNTER — Emergency Department
Admission: EM | Admit: 2018-04-15 | Discharge: 2018-04-15 | Disposition: A | Discharge status: Home / Self Care | Payer: Self-pay | Attending: Emergency Medicine | Admitting: Emergency Medicine

## 2018-04-15 DIAGNOSIS — J45909 Unspecified asthma, uncomplicated: Secondary | ICD-10-CM | POA: Insufficient documentation

## 2018-04-15 DIAGNOSIS — R569 Unspecified convulsions: Secondary | ICD-10-CM | POA: Insufficient documentation

## 2018-04-15 DIAGNOSIS — Z87891 Personal history of nicotine dependence: Secondary | ICD-10-CM | POA: Insufficient documentation

## 2018-04-15 LAB — COMPREHENSIVE METABOLIC PANEL
ALBUMIN: 4.7 g/dL (ref 3.5–5.0)
ALT: 37 U/L (ref 0–44)
AST: 38 U/L (ref 15–41)
Alkaline Phosphatase: 48 U/L (ref 38–126)
Anion gap: 7 (ref 5–15)
BILIRUBIN TOTAL: 1.1 mg/dL (ref 0.3–1.2)
BUN: 15 mg/dL (ref 6–20)
CHLORIDE: 106 mmol/L (ref 98–111)
CO2: 26 mmol/L (ref 22–32)
Calcium: 9.6 mg/dL (ref 8.9–10.3)
Creatinine, Ser: 1.32 mg/dL — ABNORMAL HIGH (ref 0.61–1.24)
GFR calc Af Amer: >60 mL/min (ref >60)
GFR calc non Af Amer: >60 mL/min (ref >60)
GLUCOSE: 107 mg/dL — AB (ref 70–99)
POTASSIUM: 4.5 mmol/L (ref 3.5–5.1)
Sodium: 139 mmol/L (ref 135–145)
Total Protein: 7.8 g/dL (ref 6.5–8.1)

## 2018-04-15 LAB — CBC WITH DIFFERENTIAL/PLATELET
ABS IMMATURE GRANULOCYTES: 0.02 10*3/uL (ref 0.00–0.07)
Basophils Absolute: 0.1 10*3/uL (ref 0.0–0.1)
Basophils Relative: 1 %
Eosinophils Absolute: 0.5 10*3/uL (ref 0.0–0.5)
Eosinophils Relative: 7 %
HCT: 50.5 % (ref 39.0–52.0)
HEMOGLOBIN: 17.4 g/dL — AB (ref 13.0–17.0)
Immature Granulocytes: 0 %
LYMPHS ABS: 1.3 10*3/uL (ref 0.7–4.0)
LYMPHS PCT: 20 %
MCH: 31.2 pg (ref 26.0–34.0)
MCHC: 34.5 g/dL (ref 30.0–36.0)
MCV: 90.5 fL (ref 80.0–100.0)
MONO ABS: 0.5 10*3/uL (ref 0.1–1.0)
MONOS PCT: 7 %
NEUTROS ABS: 3.9 10*3/uL (ref 1.7–7.7)
Neutrophils Relative %: 65 %
Platelets: 234 10*3/uL (ref 150–400)
RBC: 5.58 MIL/uL (ref 4.22–5.81)
RDW: 13.5 % (ref 11.5–15.5)
WBC: 6.2 10*3/uL (ref 4.0–10.5)
nRBC: 0 % (ref 0.0–0.2)

## 2018-04-15 MED ORDER — LEVETIRACETAM 1000 MG PO TABS: 1000.0000 mg | ORAL_TABLET | Freq: Two times a day (BID) | ORAL | 11 refills | Status: DC

## 2018-04-15 MED ORDER — DIPHENHYDRAMINE HCL 50 MG/ML IJ SOLN
12.5000 mg | Freq: Once | INTRAMUSCULAR | Status: AC
  Administered 2018-04-15: 12.5 mg via INTRAVENOUS
  Filled 2018-04-15: qty 1

## 2018-04-15 MED ORDER — LEVETIRACETAM IN NACL 1500 MG/100ML IV SOLN
1500.0000 mg | Freq: Once | INTRAVENOUS | Status: AC
  Administered 2018-04-15: 1500 mg via INTRAVENOUS
  Filled 2018-04-15: qty 100

## 2018-04-15 NOTE — ED Provider Notes (Signed)
The patient comes to the emergency department postictal although clearly protecting his airway after having a generalized tonic-clonic seizure at home.  He has a long-standing history of seizure disorder for which he takes Keppra 1000 mg 3 times a day according to his wife.  He has not taken Keppra in 2 to 3 weeks.  He is not actively seizing although he still is somewhat postictal.  We will check general labs and start an IV and give him an IV dose of Keppra now.  Full evaluation will be from the daytime doctor in about 10 minutes.   Darel Hong, MD 04/15/18 [phone]

## 2018-04-15 NOTE — ED Notes (Signed)
Wife is going to work, states to call her if need anything. [place] [phone]

## 2018-04-15 NOTE — ED Provider Notes (Signed)
[facility] Emergency Department Provider Note  ____________________________________________  Time seen: Approximately 7:27 AM  I have reviewed the triage vital signs and the nursing notes.   HISTORY  Chief Complaint Seizures   HPI Shane Leach is a 32 y.o. male with history of epilepsy and asthma who presents for evaluation after seizure.  Patient was riding the car with his wife when he had a generalized tonic-clonic seizure lasting 1 to 2 minutes.  Upon arrival to the emergency room patient still postictal however slowly waking up and protecting his airway.  According to the wife patient has not taken his Keppra for the last 2 to 3 weeks.  She reports that they do not have a primary care doctor or insurance and that patient ran out of his prescriptions.  He did not sustain any injuries.  Patient also drinks alcohol every night.  Last drink was yesterday evening.  He drinks 40 ounce beer a night.  No history of complicated withdrawals.  No drug use.  Patient denies headache, chest pain, shortness of breath, abdominal pain.  Past Medical History:  Diagnosis Date  . Asthma   . Bronchitis   . Epilepsy ([place])   . Seizures [facility])     Patient Active Problem List   Diagnosis Date Noted  . Asthma exacerbation 11/17/2016    Past Surgical History:  Procedure Laterality Date  . testicular cancer     right testicle removed    Prior to Admission medications   Medication Sig Start Date End Date Taking? Authorizing Provider  albuterol (PROVENTIL HFA;VENTOLIN HFA) 108 (90 Base) MCG/ACT inhaler Inhale 2 puffs into the lungs every 6 (six) hours as needed for wheezing or shortness of breath. 01/15/18  Yes Earleen Newport, MD  cyclobenzaprine (FLEXERIL) 5 MG tablet Take 1-2 tablets 3 times daily as needed 04/06/18  Yes Laban Emperor, PA-C  ibuprofen (ADVIL,MOTRIN) 600 MG tablet Take 1 tablet (600 mg total) by mouth every 6 (six) hours as needed. 04/06/18  Yes  Laban Emperor, PA-C  clindamycin (CLEOCIN) 150 MG capsule Take 2 capsules (300 mg total) by mouth 3 (three) times daily. Patient not taking: Reported on 04/15/2018 11/11/17   Versie Starks, PA-C  HYDROcodone-acetaminophen (NORCO/VICODIN) 5-325 MG tablet Take 1 tablet by mouth every 6 (six) hours as needed for moderate pain. Patient not taking: Reported on 04/15/2018 11/11/17   Versie Starks, PA-C  ipratropium-albuterol (DUONEB) 0.5-2.5 (3) MG/3ML SOLN Take 3 mLs by nebulization every 6 (six) hours as needed. Patient not taking: Reported on 04/15/2018 01/15/18   Earleen Newport, MD  levETIRAcetam (KEPPRA) 1000 MG tablet Take 1 tablet (1,000 mg total) by mouth 2 (two) times daily. 04/15/18 04/15/19  Rudene Re, MD  polyethylene glycol [facility]) packet Take 17 g by mouth daily. Patient not taking: Reported on 04/15/2018 04/06/18   Laban Emperor, PA-C  predniSONE (DELTASONE) 50 MG tablet Take 1 tablet by mouth daily Patient not taking: Reported on 04/15/2018 01/15/18   Earleen Newport, MD    Allergies Dilantin [phenytoin sodium extended]; Penicillins; and Tegretol [carbamazepine]  No family history on file.  Social History Social History   Tobacco Use  . Smoking status: Former Smoker    Packs/day: 0.50    Types: Cigarettes    Last attempt to quit: 10/12/2014    Years since quitting: 3.5  . Smokeless tobacco: Never Used  Substance Use Topics  . Alcohol use: Yes  . Drug use: Not Currently    Types:  Marijuana    Review of Systems  Constitutional: Negative for fever. Eyes: Negative for visual changes. ENT: Negative for sore throat. Neck: No neck pain  Cardiovascular: Negative for chest pain. Respiratory: Negative for shortness of breath. Gastrointestinal: Negative for abdominal pain, vomiting or diarrhea. Genitourinary: Negative for dysuria. Musculoskeletal: Negative for back pain. Skin: Negative for rash. Neurological: Negative for headaches, weakness or numbness. +  seizure Psych: No SI or HI  ____________________________________________   PHYSICAL EXAM:  VITAL SIGNS: ED Triage Vitals [04/15/18 0645]  Enc Vitals Group     BP (!) 137/96     Pulse Rate 73     Resp 18     Temp 97.7 F (36.5 C)     Temp Source Oral     SpO2 97 %     Weight 240 lb 1.3 oz (108.9 kg)     Height 6' (1.829 m)     Head Circumference      Peak Flow      Pain Score 9     Pain Loc      Pain Edu?      Excl. in [place]?     Constitutional: Alert and oriented, no apparent distress, slow to answer questions HEENT:      Head: Normocephalic and atraumatic.         Eyes: Conjunctivae are normal. Sclera is non-icteric.       Mouth/Throat: Mucous membranes are moist.       Neck: Supple with no signs of meningismus. Cardiovascular: Regular rate and rhythm. No murmurs, gallops, or rubs. 2+ symmetrical distal pulses are present in all extremities. No JVD. Respiratory: Normal respiratory effort. Lungs are clear to auscultation bilaterally. No wheezes, crackles, or rhonchi.  Gastrointestinal: Soft, non tender, and non distended with positive bowel sounds. No rebound or guarding. Musculoskeletal: Nontender with normal range of motion in all extremities. No edema, cyanosis, or erythema of extremities. Neurologic: Normal speech and language. Face is symmetric. Moving all extremities. No gross focal neurologic deficits are appreciated. Skin: Skin is warm, dry and intact. No rash noted. Psychiatric: Mood and affect are normal. Speech and behavior are normal.  ____________________________________________   LABS (all labs ordered are listed, but only abnormal results are displayed)  Labs Reviewed  COMPREHENSIVE METABOLIC PANEL - Abnormal; Notable for the following components:      Result Value   Glucose, Bld 107 (*)    Creatinine, Ser 1.32 (*)    All other components within normal limits  CBC WITH DIFFERENTIAL/PLATELET - Abnormal; Notable for the following components:    Hemoglobin 17.4 (*)    All other components within normal limits  URINE DRUG SCREEN, QUALITATIVE ([facility] ONLY)   ____________________________________________  EKG  ED ECG REPORT I, Rudene Re, the attending physician, personally viewed and interpreted this ECG.  Normal sinus rhythm, rate of 75, normal intervals, normal axis, BER.  Unchanged from prior. ____________________________________________  RADIOLOGY  none  ____________________________________________   PROCEDURES  Procedure(s) performed: None Procedures Critical Care performed:  None ____________________________________________   INITIAL IMPRESSION / ASSESSMENT AND PLAN / ED COURSE  32 y.o. male with history of epilepsy and asthma who presents for evaluation after seizure in the setting of 2 to 3 weeks of noncompliance with his antiepileptic medication.  Patient is slightly postictal but returning to baseline, slow to answer questions but alert, recognizes his wife, neurologically intact otherwise.  EKG with no evidence of dysrhythmias.  Will load patient with Keppra.  Will check basic labs.  Will  monitor for several hours for any recurrence of seizures.    _________________________ 10:45 AM on 04/15/2018 ----------------------------------------- Labs with no acute electrolyte abnormalities.  Patient received a dose of loading Keppra and was monitored for several hours with no recurrence of his seizures.  He is back to his baseline.  Discussed seizure precautions with patient including not driving or operating machinery until cleared by neurology.  I will provide patient with 1 years worth of Keppra prescription to ensure the patient will not run out of his medication.  Patient would not be discharged to the care of his wife.    As part of my medical decision making, I reviewed the following data within the [place] notes reviewed and incorporated, Labs reviewed , EKG interpreted , Old  chart reviewed, Notes from prior ED visits and [place] Controlled Substance Database    Pertinent labs & imaging results that were available during my care of the patient were reviewed by me and considered in my medical decision making (see chart for details).    ____________________________________________   FINAL CLINICAL IMPRESSION(S) / ED DIAGNOSES  Final diagnoses:  Seizure ([place])      NEW MEDICATIONS STARTED DURING THIS VISIT:  ED Discharge Orders         Ordered    levETIRAcetam (KEPPRA) 1000 MG tablet  2 times daily     04/15/18 1043           Note:  This document was prepared using Dragon voice recognition software and may include unintentional dictation errors.    Alfred Levins, [place], MD 04/15/18 1046

## 2018-04-15 NOTE — Discharge Instructions (Addendum)
Seizures may happen at any time. It is important to take certain precautions to maintain your safety.  Do not drive or operate heavy machinery until cleared by neurology  Follow up with your doctor in 1-3 days.  If you were started on a seizure medication, take it as prescribed.  During a seizure, a person may injure himself or herself. Seizure precautions are guidelines that a person can follow in order to minimize injury during a seizure. For any activity, it is important to ask, "What would happen if I had a seizure while doing this?" Follow the below precautions.  Bathroom Safety  A person with seizures may want to shower instead of bathe to avoid accidental drowning. If falls occur during the patient's typical seizure, a person should use a shower seat, preferably one with a safety strap.  Use nonskid strips in your shower or tub.  Never use electrical equipment near water. This prevents accidental electrocution.  Consider changing glass in shower doors to shatterproof glass.  Risk analyst If possible, cook when someone else is nearby.  Use the back burners of the stove to prevent accidental burns.  Use shatterproof containers as much as possible. For instance, sauces can be transferred from glass bottles to plastic containers for use.  Limit time that is required using knives or other sharp objects. If possible, buy foods that are already cut, or ask someone to help in meal preparation.   General Safety at [place] not smoke or light fires in the fireplace unless someone else is present.  Do not use space heaters that can be accidentally overturned.  When alone, avoid using step stools or ladders, and do not clean rooftop gutters.  Purchase power tools and motorized Company secretary which have a safety switch that will stop the machine if you release the handle (a 'dead man's' switch).   Driving and Transportation DO NOT [place] and/or you have  permission to drive from your state's Department of Motor Vehicles  [facility]). Each state has different laws. Please refer to the following link on the [place] website for more information: http://www.epilepsyfoundation.org/answerplace/Social/driving/drivingu.cfm  If you ride a bicycle, wear a helmet and any other necessary protective gear.  When taking public transportation like the bus or subway, stay clear of the platform edge.   Outdoor Insurance underwriter is okay, but does present certain risks. Never swim alone, and tell friends what to do if you have a seizure while swimming.  Wear appropriate protective equipment.  Ski with a friend. If a seizure occurs, your friend can seek help, if needed. He or she can also help to get you out of the cold. Consider using a safety hook or belt while riding the ski lift.

## 2018-04-15 NOTE — ED Triage Notes (Signed)
Pt to triage via w/c with no distress noted; per SO, Pt had seizure enroute to work; off keppra x wk; pt now awake & alert

## 2018-04-15 NOTE — ED Notes (Addendum)
Wife picked pt up

## 2018-04-15 NOTE — ED Notes (Signed)
Dr. Alfred Levins made aware of pt's request for work note

## 2018-05-10 ENCOUNTER — Emergency Department: Payer: Self-pay

## 2018-05-10 ENCOUNTER — Emergency Department
Admission: EM | Admit: 2018-05-10 | Discharge: 2018-05-10 | Disposition: A | Discharge status: Home / Self Care | Payer: Self-pay | Attending: Emergency Medicine | Admitting: Emergency Medicine

## 2018-05-10 ENCOUNTER — Encounter: Payer: Self-pay | Admitting: Emergency Medicine

## 2018-05-10 ENCOUNTER — Other Ambulatory Visit: Payer: Self-pay

## 2018-05-10 DIAGNOSIS — Z87891 Personal history of nicotine dependence: Secondary | ICD-10-CM | POA: Insufficient documentation

## 2018-05-10 DIAGNOSIS — J45909 Unspecified asthma, uncomplicated: Secondary | ICD-10-CM | POA: Insufficient documentation

## 2018-05-10 DIAGNOSIS — J208 Acute bronchitis due to other specified organisms: Secondary | ICD-10-CM | POA: Insufficient documentation

## 2018-05-10 DIAGNOSIS — R0602 Shortness of breath: Secondary | ICD-10-CM

## 2018-05-10 DIAGNOSIS — Z79899 Other long term (current) drug therapy: Secondary | ICD-10-CM | POA: Insufficient documentation

## 2018-05-10 LAB — URINALYSIS, COMPLETE (UACMP) WITH MICROSCOPIC
BACTERIA UA: NONE SEEN
Bilirubin Urine: NEGATIVE
Glucose, UA: NEGATIVE mg/dL
Ketones, ur: 20 mg/dL — AB
Leukocytes, UA: NEGATIVE
Nitrite: NEGATIVE
PROTEIN: 100 mg/dL — AB
Specific Gravity, Urine: 1.029 (ref 1.005–1.030)
pH: 5 (ref 5.0–8.0)

## 2018-05-10 LAB — CBC
HCT: 51 % (ref 39.0–52.0)
Hemoglobin: 17.1 g/dL — ABNORMAL HIGH (ref 13.0–17.0)
MCH: 29.9 pg (ref 26.0–34.0)
MCHC: 33.5 g/dL (ref 30.0–36.0)
MCV: 89.3 fL (ref 80.0–100.0)
PLATELETS: 239 10*3/uL (ref 150–400)
RBC: 5.71 MIL/uL (ref 4.22–5.81)
RDW: 13.1 % (ref 11.5–15.5)
WBC: 7.2 10*3/uL (ref 4.0–10.5)
nRBC: 0 % (ref 0.0–0.2)

## 2018-05-10 LAB — BASIC METABOLIC PANEL
Anion gap: 11 (ref 5–15)
BUN: 12 mg/dL (ref 6–20)
CALCIUM: 9.2 mg/dL (ref 8.9–10.3)
CO2: 22 mmol/L (ref 22–32)
CREATININE: 1.36 mg/dL — AB (ref 0.61–1.24)
Chloride: 104 mmol/L (ref 98–111)
GFR calc Af Amer: >60 mL/min (ref >60)
GFR calc non Af Amer: >60 mL/min (ref >60)
GLUCOSE: 113 mg/dL — AB (ref 70–99)
Potassium: 4.1 mmol/L (ref 3.5–5.1)
Sodium: 137 mmol/L (ref 135–145)

## 2018-05-10 LAB — TROPONIN I: Troponin I: <0.03 ng/mL (ref <0.03)

## 2018-05-10 LAB — INFLUENZA PANEL BY PCR (TYPE A & B)
INFLBPCR: NEGATIVE
Influenza A By PCR: NEGATIVE

## 2018-05-10 MED ORDER — ALBUTEROL SULFATE (2.5 MG/3ML) 0.083% IN NEBU
INHALATION_SOLUTION | RESPIRATORY_TRACT | Status: AC
  Administered 2018-05-10: 5 mg via RESPIRATORY_TRACT
  Filled 2018-05-10: qty 6

## 2018-05-10 MED ORDER — IBUPROFEN 600 MG PO TABS: 600.0000 mg | ORAL_TABLET | Freq: Four times a day (QID) | ORAL | 0 refills | Status: DC | PRN

## 2018-05-10 MED ORDER — SODIUM CHLORIDE 0.9 % IV BOLUS
1000.0000 mL | Freq: Once | INTRAVENOUS | Status: AC
  Administered 2018-05-10: 1000 mL via INTRAVENOUS

## 2018-05-10 MED ORDER — PREDNISONE 20 MG PO TABS: 60.0000 mg | ORAL_TABLET | Freq: Every day | ORAL | 0 refills | Status: AC

## 2018-05-10 MED ORDER — METHYLPREDNISOLONE SODIUM SUCC 125 MG IJ SOLR
125.0000 mg | Freq: Once | INTRAMUSCULAR | Status: AC
  Administered 2018-05-10: 125 mg via INTRAVENOUS
  Filled 2018-05-10: qty 2

## 2018-05-10 MED ORDER — ALBUTEROL SULFATE HFA 108 (90 BASE) MCG/ACT IN AERS: 2.0000 | INHALATION_SPRAY | Freq: Four times a day (QID) | RESPIRATORY_TRACT | 1 refills | Status: DC | PRN

## 2018-05-10 MED ORDER — ALBUTEROL SULFATE (2.5 MG/3ML) 0.083% IN NEBU
5.0000 mg | INHALATION_SOLUTION | Freq: Once | RESPIRATORY_TRACT | Status: AC
  Administered 2018-05-10: 5 mg via RESPIRATORY_TRACT

## 2018-05-10 MED ORDER — MORPHINE SULFATE (PF) 4 MG/ML IV SOLN
4.0000 mg | Freq: Once | INTRAVENOUS | Status: AC
  Administered 2018-05-10: 4 mg via INTRAVENOUS
  Filled 2018-05-10: qty 1

## 2018-05-10 MED ORDER — KETOROLAC TROMETHAMINE 30 MG/ML IJ SOLN
30.0000 mg | Freq: Once | INTRAMUSCULAR | Status: AC
  Administered 2018-05-10: 30 mg via INTRAVENOUS
  Filled 2018-05-10: qty 1

## 2018-05-10 NOTE — ED Notes (Signed)
MD at bedside. partient reports feeling better. Flu negative. Patient discharged to home. VSS.

## 2018-05-10 NOTE — ED Provider Notes (Signed)
[facility] Emergency Department Provider Note ____________________________________________   First MD Initiated Contact with Patient 05/10/18 1752     (approximate)  I have reviewed the triage vital signs and the nursing notes.   HISTORY  Chief Complaint Shortness of Breath  Level 5 caveat: History of present illness limited due to respiratory distress  HPI Shane Leach is a 32 y.o. male with PMH as noted below including history of asthma who presents with shortness of breath over the last several days, gradual onset, associated with fever and cough.  The patient reports body aches and headache.  He also reports some increased urination.  Past Medical History:  Diagnosis Date  . Asthma   . Bronchitis   . Epilepsy ([place])   . Seizures [facility])     Patient Active Problem List   Diagnosis Date Noted  . Asthma exacerbation 11/17/2016    Past Surgical History:  Procedure Laterality Date  . testicular cancer     right testicle removed    Prior to Admission medications   Medication Sig Start Date End Date Taking? Authorizing Provider  albuterol (PROVENTIL HFA;VENTOLIN HFA) 108 (90 Base) MCG/ACT inhaler Inhale 2 puffs into the lungs every 6 (six) hours as needed for wheezing or shortness of breath. 01/15/18   Earleen Newport, MD  albuterol (PROVENTIL HFA;VENTOLIN HFA) 108 (90 Base) MCG/ACT inhaler Inhale 2 puffs into the lungs every 6 (six) hours as needed for wheezing or shortness of breath. 05/10/18   Arta Silence, MD  clindamycin (CLEOCIN) 150 MG capsule Take 2 capsules (300 mg total) by mouth 3 (three) times daily. Patient not taking: Reported on 04/15/2018 11/11/17   Versie Starks, PA-C  cyclobenzaprine (FLEXERIL) 5 MG tablet Take 1-2 tablets 3 times daily as needed 04/06/18   Laban Emperor, PA-C  HYDROcodone-acetaminophen (NORCO/VICODIN) 5-325 MG tablet Take 1 tablet by mouth every 6 (six) hours as needed for moderate pain. Patient not  taking: Reported on 04/15/2018 11/11/17   Versie Starks, PA-C  ibuprofen (ADVIL,MOTRIN) 600 MG tablet Take 1 tablet (600 mg total) by mouth every 6 (six) hours as needed. 05/10/18   Arta Silence, MD  ipratropium-albuterol (DUONEB) 0.5-2.5 (3) MG/3ML SOLN Take 3 mLs by nebulization every 6 (six) hours as needed. Patient not taking: Reported on 04/15/2018 01/15/18   Earleen Newport, MD  levETIRAcetam (KEPPRA) 1000 MG tablet Take 1 tablet (1,000 mg total) by mouth 2 (two) times daily. 04/15/18 04/15/19  Rudene Re, MD  polyethylene glycol [facility]) packet Take 17 g by mouth daily. Patient not taking: Reported on 04/15/2018 04/06/18   Laban Emperor, PA-C  predniSONE (DELTASONE) 20 MG tablet Take 3 tablets (60 mg total) by mouth daily for 5 days. 05/11/18 05/16/18  Arta Silence, MD    Allergies Dilantin [phenytoin sodium extended]; Penicillins; and Tegretol [carbamazepine]  No family history on file.  Social History Social History   Tobacco Use  . Smoking status: Former Smoker    Packs/day: 0.50    Types: Cigarettes    Last attempt to quit: 10/12/2014    Years since quitting: 3.5  . Smokeless tobacco: Never Used  Substance Use Topics  . Alcohol use: Yes  . Drug use: Not Currently    Types: Marijuana    Review of Systems Level 5 caveat: Unable to obtain review of systems due to respiratory distress    ____________________________________________   PHYSICAL EXAM:  VITAL SIGNS: ED Triage Vitals  Enc Vitals Group     BP  05/10/18 1743 117/79     Pulse Rate 05/10/18 1743 (!) 131     Resp 05/10/18 1743 (!) 22     Temp 05/10/18 1743 (!) 101.5 F (38.6 C)     Temp Source 05/10/18 1743 Oral     SpO2 05/10/18 1743 95 %     Weight 05/10/18 1755 240 lb 1.3 oz (108.9 kg)     Height 05/10/18 1755 6' (1.829 m)     Head Circumference --      Peak Flow --      Pain Score 05/10/18 1755 8     Pain Loc --      Pain Edu? --      Excl. in [place]? --     Constitutional:  Alert and oriented.  Uncomfortable appearing.   Eyes: Conjunctivae are normal.  Head: Atraumatic. Nose: No congestion/rhinnorhea. Mouth/Throat: Mucous membranes are slightly dry.   Neck: Normal range of motion.  Supple.  No meningeal signs. Cardiovascular: Tachycardic, regular rhythm. Grossly normal heart sounds.  Good peripheral circulation. Respiratory: Increased respiratory effort with accessory muscle use.  Bilateral diffuse wheezing. Gastrointestinal: Soft and nontender. No distention.  Genitourinary: No flank tenderness. Musculoskeletal:  Extremities warm and well perfused.  Neurologic:  Normal speech and language. No gross focal neurologic deficits are appreciated.  Skin:  Skin is warm and dry. No rash noted. Psychiatric: Mood and affect are normal. Speech and behavior are normal.  ____________________________________________   LABS (all labs ordered are listed, but only abnormal results are displayed)  Labs Reviewed  BASIC METABOLIC PANEL - Abnormal; Notable for the following components:      Result Value   Glucose, Bld 113 (*)    Creatinine, Ser 1.36 (*)    All other components within normal limits  CBC - Abnormal; Notable for the following components:   Hemoglobin 17.1 (*)    All other components within normal limits  TROPONIN I  INFLUENZA PANEL BY PCR (TYPE A & B)  URINALYSIS, COMPLETE (UACMP) WITH MICROSCOPIC   ____________________________________________  EKG  ED ECG REPORT I, Arta Silence, the attending physician, personally viewed and interpreted this ECG.  Date: 05/10/2018 EKG Time: 1754 Rate: 125 Rhythm: Sinus tachycardia QRS Axis: normal Intervals: Incomplete RBBB ST/T Wave abnormalities: Repolarization Narrative Interpretation: no evidence of acute ischemia ____________________________________________  RADIOLOGY  CXR: No focal infiltrate ____________________________________________   PROCEDURES  Procedure(s) performed:  No  Procedures  Critical Care performed: Yes  CRITICAL CARE Performed by: Arta Silence   Total critical care time: 30 minutes  Critical care time was exclusive of separately billable procedures and treating other patients.  Critical care was necessary to treat or prevent imminent or life-threatening deterioration.  Critical care was time spent personally by me on the following activities: development of treatment plan with patient and/or surrogate as well as nursing, discussions with consultants, evaluation of patient's response to treatment, examination of patient, obtaining history from patient or surrogate, ordering and performing treatments and interventions, ordering and review of laboratory studies, ordering and review of radiographic studies, pulse oximetry and re-evaluation of patient's condition. ____________________________________________   INITIAL IMPRESSION / ASSESSMENT AND PLAN / ED COURSE  Pertinent labs & imaging results that were available during my care of the patient were reviewed by me and considered in my medical decision making (see chart for details).  32 year old male with history of asthma and epilepsy presents with shortness of breath, cough, and fever over the last couple of days.  On arrival, the patient is tachycardic  and febrile.  His O2 saturation is in the mid 90s on room air.  He has increased work of breathing and diffuse wheezing bilaterally.  He appears diaphoretic.  He reports pain "all over."  EKG shows some repolarization but no ischemic findings.  Overall presentation is consistent with acute asthma/bronchitis, likely viral in etiology.  We will obtain chest x-ray to rule out pneumonia, obtain lab work-up including influenza swab, give bronchodilators and steroid, and reassess.  ----------------------------------------- 8:13 PM on 05/10/2018 -----------------------------------------  The patient had significant improvement in his  breathing after the nebulizer treatments and steroid.  His fever is now resolved and his heart rate has normalized.  He continued to have some body aches after the Toradol, so I gave a dose of morphine.  Chest x-ray showed no focal infiltrate, and the lab work-up is unremarkable.  His flu is negative.  On reassessment, the patient now states he feels much better and appears well.  He has no respiratory distress and his O2 saturation is in the mid 90s on room air.  His wheeze has almost completely resolved.  He would like to go home.   I counseled him on the results of the work-up.  I instructed him to continue albuterol every 4-6 hours and I will prescribe prednisone.  Return precautions given, and he expresses understanding. ____________________________________________   FINAL CLINICAL IMPRESSION(S) / ED DIAGNOSES  Final diagnoses:  Acute viral bronchitis      NEW MEDICATIONS STARTED DURING THIS VISIT:  New Prescriptions   ALBUTEROL (PROVENTIL HFA;VENTOLIN HFA) 108 (90 BASE) MCG/ACT INHALER    Inhale 2 puffs into the lungs every 6 (six) hours as needed for wheezing or shortness of breath.   IBUPROFEN (ADVIL,MOTRIN) 600 MG TABLET    Take 1 tablet (600 mg total) by mouth every 6 (six) hours as needed.   PREDNISONE (DELTASONE) 20 MG TABLET    Take 3 tablets (60 mg total) by mouth daily for 5 days.     Note:  This document was prepared using Dragon voice recognition software and may include unintentional dictation errors.   Arta Silence, MD 05/10/18 2018

## 2018-05-10 NOTE — Discharge Instructions (Signed)
Use albuterol either via the nebulizer or inhaler every 4-6 hours for the next several days.  Take the prednisone starting tomorrow and finish the full 5-day course.   You can use the prescribed ibuprofen for pain or fever (or over the counter tylenol).   Return to the ER for new, worsening or persistent shortness of breath, fever, vomiting, weakness, chest pain, severe headache or neck pain or any other new or worsening symptoms that concern you.

## 2018-05-10 NOTE — ED Triage Notes (Signed)
Pt arrived with multiple complaints including shortness of breath, fever, generalized body aches, increased urination, and cough. Pt diaphoretic, tachycardic, and wheezing in triage.

## 2018-05-10 NOTE — ED Notes (Signed)
Patient reports pain better reports level 2/10. Md aware.

## 2018-05-10 NOTE — ED Notes (Signed)
pcxr completed. awating results for plan of care.

## 2018-05-12 NOTE — ED Notes (Signed)
Called patient to assure follow up.  Has protein in urine.  He says he does not have a doctor.  I asked him who gives him seizure med and he says he comes to the ED if he needs it.  I explained [facility] and gave  Him the number.  I asked him to call them so they can take care of any medical problems he has. He agrees to call.

## 2018-07-20 ENCOUNTER — Other Ambulatory Visit: Payer: Self-pay

## 2018-07-20 ENCOUNTER — Emergency Department
Admission: EM | Admit: 2018-07-20 | Discharge: 2018-07-20 | Disposition: A | Discharge status: Home / Self Care | Payer: Self-pay | Attending: Emergency Medicine | Admitting: Emergency Medicine

## 2018-07-20 ENCOUNTER — Encounter: Payer: Self-pay | Admitting: Emergency Medicine

## 2018-07-20 DIAGNOSIS — Z87891 Personal history of nicotine dependence: Secondary | ICD-10-CM | POA: Insufficient documentation

## 2018-07-20 DIAGNOSIS — J4521 Mild intermittent asthma with (acute) exacerbation: Secondary | ICD-10-CM | POA: Insufficient documentation

## 2018-07-20 DIAGNOSIS — Z79899 Other long term (current) drug therapy: Secondary | ICD-10-CM | POA: Insufficient documentation

## 2018-07-20 MED ORDER — IPRATROPIUM-ALBUTEROL 0.5-2.5 (3) MG/3ML IN SOLN
RESPIRATORY_TRACT | Status: AC
  Filled 2018-07-20: qty 3

## 2018-07-20 MED ORDER — METHYLPREDNISOLONE SODIUM SUCC 125 MG IJ SOLR
125.0000 mg | Freq: Once | INTRAMUSCULAR | Status: AC
  Administered 2018-07-20: 125 mg via INTRAMUSCULAR
  Filled 2018-07-20: qty 2

## 2018-07-20 MED ORDER — IPRATROPIUM-ALBUTEROL 0.5-2.5 (3) MG/3ML IN SOLN
3.0000 mL | Freq: Once | RESPIRATORY_TRACT | Status: AC
  Administered 2018-07-20: 3 mL via RESPIRATORY_TRACT
  Filled 2018-07-20: qty 3

## 2018-07-20 NOTE — ED Provider Notes (Signed)
[facility] Emergency Department Provider Note   ____________________________________________   First MD Initiated Contact with Patient 07/20/18 [phone]     (approximate)  I have reviewed the triage vital signs and the nursing notes.   HISTORY  Chief Complaint Asthma    HPI Shane Leach is a 33 y.o. male   patient presents with flareup of asthma.  Patient has a history of bronchitis.  Patient presents for nonproductive cough and wheezing.  Patient denies fever associated with this complaint.  Patient denies nausea, vomiting, diarrhea.  Patient has not taken flu shot for this season.  Patient denies chest pain.   Past Medical History:  Diagnosis Date  . Asthma   . Bronchitis   . Epilepsy ([place])   . Seizures [facility])     Patient Active Problem List   Diagnosis Date Noted  . Asthma exacerbation 11/17/2016    Past Surgical History:  Procedure Laterality Date  . testicular cancer     right testicle removed    Prior to Admission medications   Medication Sig Start Date End Date Taking? Authorizing Provider  albuterol (PROVENTIL HFA;VENTOLIN HFA) 108 (90 Base) MCG/ACT inhaler Inhale 2 puffs into the lungs every 6 (six) hours as needed for wheezing or shortness of breath. 01/15/18   Earleen Newport, MD  albuterol (PROVENTIL HFA;VENTOLIN HFA) 108 (90 Base) MCG/ACT inhaler Inhale 2 puffs into the lungs every 6 (six) hours as needed for wheezing or shortness of breath. 05/10/18   Arta Silence, MD  clindamycin (CLEOCIN) 150 MG capsule Take 2 capsules (300 mg total) by mouth 3 (three) times daily. Patient not taking: Reported on 04/15/2018 11/11/17   Versie Starks, PA-C  cyclobenzaprine (FLEXERIL) 5 MG tablet Take 1-2 tablets 3 times daily as needed 04/06/18   Laban Emperor, PA-C  HYDROcodone-acetaminophen (NORCO/VICODIN) 5-325 MG tablet Take 1 tablet by mouth every 6 (six) hours as needed for moderate pain. Patient not taking: Reported on  04/15/2018 11/11/17   Versie Starks, PA-C  ibuprofen (ADVIL,MOTRIN) 600 MG tablet Take 1 tablet (600 mg total) by mouth every 6 (six) hours as needed. 05/10/18   Arta Silence, MD  ipratropium-albuterol (DUONEB) 0.5-2.5 (3) MG/3ML SOLN Take 3 mLs by nebulization every 6 (six) hours as needed. Patient not taking: Reported on 04/15/2018 01/15/18   Earleen Newport, MD  levETIRAcetam (KEPPRA) 1000 MG tablet Take 1 tablet (1,000 mg total) by mouth 2 (two) times daily. 04/15/18 04/15/19  Rudene Re, MD  polyethylene glycol [facility]) packet Take 17 g by mouth daily. Patient not taking: Reported on 04/15/2018 04/06/18   Laban Emperor, PA-C    Allergies Dilantin [phenytoin sodium extended]; Penicillins; and Tegretol [carbamazepine]  History reviewed. No pertinent family history.  Social History Social History   Tobacco Use  . Smoking status: Former Smoker    Packs/day: 0.50    Types: Cigarettes    Last attempt to quit: 10/12/2014    Years since quitting: 3.7  . Smokeless tobacco: Never Used  Substance Use Topics  . Alcohol use: Yes  . Drug use: Not Currently    Types: Marijuana    Review of Systems Constitutional: No fever/chills Eyes: No visual changes. ENT: No sore throat. Cardiovascular: Denies chest pain. Respiratory: Denies shortness of breath.  Wheezing and coughing. Gastrointestinal: No abdominal pain.  No nausea, no vomiting.  No diarrhea.  No constipation. Genitourinary: Negative for dysuria. Musculoskeletal: Negative for back pain. Skin: Negative for rash. Neurological: Negative for headaches, focal weakness or  numbness.   ____________________________________________   PHYSICAL EXAM:  VITAL SIGNS: ED Triage Vitals  Enc Vitals Group     BP 07/20/18 0720 (!) 136/95     Pulse Rate 07/20/18 0719 86     Resp 07/20/18 0720 (!) 25     Temp 07/20/18 0719 97.7 F (36.5 C)     Temp Source 07/20/18 0719 Oral     SpO2 07/20/18 0719 98 %     Weight 07/20/18  0719 220 lb (99.8 kg)     Height 07/20/18 0719 5\' 11"  (1.803 m)     Head Circumference --      Peak Flow --      Pain Score 07/20/18 0719 0     Pain Loc --      Pain Edu? --      Excl. in [place]? --     Constitutional: Alert and oriented. Well appearing and in no acute distress. Neck: No stridor. Cardiovascular: Normal rate, regular rhythm. Grossly normal heart sounds.  Good peripheral circulation. Respiratory: Normal respiratory effort.  No retractions. Lungs expiratory wheezing. Skin:  Skin is warm, dry and intact. No rash noted. Psychiatric: Mood and affect are normal. Speech and behavior are normal.  ____________________________________________   LABS (all labs ordered are listed, but only abnormal results are displayed)  Labs Reviewed - No data to display ____________________________________________  EKG   ____________________________________________  RADIOLOGY  ED MD interpretation:    Official radiology report(s): No results found.  ____________________________________________   PROCEDURES  Procedure(s) performed: None  Procedures  Critical Care performed: No  ____________________________________________   INITIAL IMPRESSION / ASSESSMENT AND PLAN / ED COURSE  As part of my medical decision making, I reviewed the following data within the [place]     Patient presents with cough and wheezing.  Patient wheezing cleared with 1 DuoNeb treatment.  Patient given discharge care instruction advised continue previous medications.     ____________________________________________   FINAL CLINICAL IMPRESSION(S) / ED DIAGNOSES  Final diagnoses:  Mild intermittent asthma with exacerbation     ED Discharge Orders    None       Note:  This document was prepared using Dragon voice recognition software and may include unintentional dictation errors.    Sable Feil, PA-C 07/20/18 0981    Lavonia Drafts, MD 07/20/18 1147

## 2018-07-20 NOTE — ED Triage Notes (Addendum)
Here for asthma flare/possible bronchitis.  Hx of same and feels like when needs steroids. Mild Tachypnea noted but unlabored in triage. VSS.  Non productive cough without fever.  Expiratory wheezing noted on auscultation

## 2018-07-20 NOTE — Discharge Instructions (Addendum)
Continue previous medications

## 2018-07-20 NOTE — ED Notes (Addendum)
See triage note  Presents with possible asthma flare  States he has been using inhaler and SVN with min relief    sxs' started on Thursday  No fever  But states he feels like his lungs are heavy

## 2018-08-28 ENCOUNTER — Other Ambulatory Visit: Payer: Self-pay

## 2018-08-28 ENCOUNTER — Emergency Department
Admission: EM | Admit: 2018-08-28 | Discharge: 2018-08-28 | Disposition: A | Discharge status: Home / Self Care | Payer: Self-pay | Attending: Emergency Medicine | Admitting: Emergency Medicine

## 2018-08-28 ENCOUNTER — Encounter: Payer: Self-pay | Admitting: Emergency Medicine

## 2018-08-28 DIAGNOSIS — Z79899 Other long term (current) drug therapy: Secondary | ICD-10-CM | POA: Insufficient documentation

## 2018-08-28 DIAGNOSIS — G40909 Epilepsy, unspecified, not intractable, without status epilepticus: Secondary | ICD-10-CM | POA: Insufficient documentation

## 2018-08-28 DIAGNOSIS — J4521 Mild intermittent asthma with (acute) exacerbation: Secondary | ICD-10-CM | POA: Insufficient documentation

## 2018-08-28 DIAGNOSIS — Z87891 Personal history of nicotine dependence: Secondary | ICD-10-CM | POA: Insufficient documentation

## 2018-08-28 MED ORDER — PREDNISONE 20 MG PO TABS: 60.0000 mg | ORAL_TABLET | Freq: Every day | ORAL | 0 refills | Status: DC

## 2018-08-28 MED ORDER — ALBUTEROL SULFATE HFA 108 (90 BASE) MCG/ACT IN AERS: INHALATION_SPRAY | RESPIRATORY_TRACT | 1 refills | Status: DC

## 2018-08-28 MED ORDER — IPRATROPIUM-ALBUTEROL 0.5-2.5 (3) MG/3ML IN SOLN
3.0000 mL | Freq: Once | RESPIRATORY_TRACT | Status: AC
  Administered 2018-08-28: 3 mL via RESPIRATORY_TRACT
  Filled 2018-08-28: qty 3

## 2018-08-28 MED ORDER — PREDNISONE 20 MG PO TABS
60.0000 mg | ORAL_TABLET | ORAL | Status: AC
  Administered 2018-08-28: 60 mg via ORAL
  Filled 2018-08-28: qty 3

## 2018-08-28 NOTE — ED Triage Notes (Signed)
Pt reports increased SOB x1 week, unrelieved by albuterol inhaler. Pt reports chest became tight this AM and inhaler provided no relief. Pt denies cough, nasal congestion and fever.

## 2018-08-28 NOTE — ED Provider Notes (Signed)
[facility] Emergency Department Provider Note  ____________________________________________   First MD Initiated Contact with Patient 08/28/18 [phone]     (approximate)  I have reviewed the triage vital signs and the nursing notes.   HISTORY  Chief Complaint Shortness of Breath    HPI Shane Leach is a 33 y.o. male with a history of epilepsy and asthma with frequent asthma exacerbations and visits to the emergency department because he does not have a primary care doctor.  He presents by private vehicle tonight for gradually increasing shortness of breath for about 1 week.  He has been using his albuterol inhaler and nebulizer treatments at home but they do not seem to help.  He started having some tightness in his chest earlier this morning (almost 24 hours ago) similar to prior asthma exacerbations which were not relieved with the inhaler.  Exertion makes the symptoms little bit worse.  He denies fever/chills, nasal congestion, sore throat, runny nose, chest pain, nausea, vomiting, abdominal pain, and dysuria.  He says the symptoms feel similar to prior asthma exacerbations.  He has not been traveling and has not come in contact with any individuals from areas of high COVID-19 prevalence nor who have been diagnosed with COVID-19.         Past Medical History:  Diagnosis Date  . Asthma   . Bronchitis   . Epilepsy ([place])   . Seizures [facility])     Patient Active Problem List   Diagnosis Date Noted  . Asthma exacerbation 11/17/2016    Past Surgical History:  Procedure Laterality Date  . testicular cancer     right testicle removed    Prior to Admission medications   Medication Sig Start Date End Date Taking? Authorizing Provider  albuterol (PROVENTIL HFA;VENTOLIN HFA) 108 (90 Base) MCG/ACT inhaler Inhale 2-4 puffs by mouth every 4 hours as needed for wheezing, cough, and/or shortness of breath 08/28/18   Hinda Kehr, MD  clindamycin (CLEOCIN) 150  MG capsule Take 2 capsules (300 mg total) by mouth 3 (three) times daily. Patient not taking: Reported on 04/15/2018 11/11/17   Versie Starks, PA-C  cyclobenzaprine (FLEXERIL) 5 MG tablet Take 1-2 tablets 3 times daily as needed 04/06/18   Laban Emperor, PA-C  HYDROcodone-acetaminophen (NORCO/VICODIN) 5-325 MG tablet Take 1 tablet by mouth every 6 (six) hours as needed for moderate pain. Patient not taking: Reported on 04/15/2018 11/11/17   Versie Starks, PA-C  ibuprofen (ADVIL,MOTRIN) 600 MG tablet Take 1 tablet (600 mg total) by mouth every 6 (six) hours as needed. 05/10/18   Arta Silence, MD  ipratropium-albuterol (DUONEB) 0.5-2.5 (3) MG/3ML SOLN Take 3 mLs by nebulization every 6 (six) hours as needed. Patient not taking: Reported on 04/15/2018 01/15/18   Earleen Newport, MD  levETIRAcetam (KEPPRA) 1000 MG tablet Take 1 tablet (1,000 mg total) by mouth 2 (two) times daily. 04/15/18 04/15/19  Rudene Re, MD  polyethylene glycol [facility] packet Take 17 g by mouth daily. Patient not taking: Reported on 04/15/2018 04/06/18   Laban Emperor, PA-C  predniSONE (DELTASONE) 20 MG tablet Take 3 tablets (60 mg total) by mouth daily. Take the first dose of your prescription on 08/29/18 and continue for the next 4 days until the medication is gone. 08/29/18   Hinda Kehr, MD    Allergies Dilantin [phenytoin sodium extended]; Penicillins; and Tegretol [carbamazepine]  History reviewed. No pertinent family history.  Social History Social History   Tobacco Use  . Smoking status: Former  Smoker    Packs/day: 0.50    Types: Cigarettes    Last attempt to quit: 10/12/2014    Years since quitting: 3.8  . Smokeless tobacco: Never Used  Substance Use Topics  . Alcohol use: Yes  . Drug use: Not Currently    Types: Marijuana    Review of Systems Constitutional: No fever/chills Eyes: No visual changes. ENT: No sore throat. Cardiovascular: Denies chest pain. Respiratory: Gradually  worsening shortness of breath for about a week similar to prior asthma exacerbations as described above Gastrointestinal: No abdominal pain.  No nausea, no vomiting.  No diarrhea.  No constipation. Genitourinary: Negative for dysuria. Musculoskeletal: Negative for neck pain.  Negative for back pain. Integumentary: Negative for rash. Neurological: Negative for headaches, focal weakness or numbness.   ____________________________________________   PHYSICAL EXAM:  VITAL SIGNS: ED Triage Vitals [08/28/18 0550]  Enc Vitals Group     BP (!) 152/88     Pulse Rate 74     Resp (!) 22     Temp 98.9 F (37.2 C)     Temp Source Oral     SpO2 95 %     Weight      Height      Head Circumference      Peak Flow      Pain Score      Pain Loc      Pain Edu?      Excl. in [place]?     Constitutional: Alert and oriented. Well appearing and in no acute distress. Eyes: Conjunctivae are normal.  Head: Atraumatic. Nose: No congestion/rhinnorhea. Mouth/Throat: Mucous membranes are moist.  Oropharynx is nonerythematous. Neck: No stridor.  No meningeal signs.   Cardiovascular: Normal rate, regular rhythm. Good peripheral circulation. Grossly normal heart sounds. Respiratory: Normal respiratory effort.  No retractions.  Mild expiratory wheezing throughout lung fields.  No cough. Gastrointestinal: Soft and nontender. No distention.  Musculoskeletal: No lower extremity tenderness nor edema. No gross deformities of extremities. Neurologic:  Normal speech and language. No gross focal neurologic deficits are appreciated.  Skin:  Skin is warm, dry and intact. No rash noted. Psychiatric: Mood and affect are normal. Speech and behavior are normal.  ____________________________________________   LABS (all labs ordered are listed, but only abnormal results are displayed)  Labs Reviewed - No data to display ____________________________________________  EKG  ED ECG REPORT I, Hinda Kehr, the attending  physician, personally viewed and interpreted this ECG.  Date: 08/28/2018 EKG Time: 5:53 AM Rate: 78 Rhythm: normal sinus rhythm QRS Axis: normal Intervals: normal ST/T Wave abnormalities: normal Narrative Interpretation: no evidence of acute ischemia  ____________________________________________  RADIOLOGY   ED MD interpretation: No indication for imaging  Official radiology report(s): No results found.  ____________________________________________   PROCEDURES   Procedure(s) performed (including Critical Care):  Procedures   ____________________________________________   INITIAL IMPRESSION / MDM / [place] / ED COURSE  As part of my medical decision making, I reviewed the following data within the [place] notes reviewed and incorporated, EKG interpreted , Old chart reviewed and Notes from prior ED visits         Differential diagnosis includes, but is not limited to, asthma exacerbation, viral respiratory illness, pneumonia, COVID-19.  The patient has no risk factors at all for COVID-19 and the current belief is that it is not prevalent in this community.  The patient has almost a zero risk of this infection and he meets no criteria for  testing at this time.  I looked through his medical record and he has numerous prior emergency department visits where he feels much better after breathing treatments and steroids.  He has been much more severe in the past and required IV medicine and careful observation but he has mild symptoms at this time.  Given the negligible concern for COVID-19 and his very strong history of asthma exacerbations, I will treat him with 3 duo nebs and prednisone 60 mg by mouth.  After that time I anticipate he will be discharged for outpatient follow-up and a prescription for prednisone for 5 days.  He understands and agrees with this plan.  He is not concerned about infectious illness at this time and no further  evaluation or work-up is needed.  I am giving him information about the drivable clinic in [place] and the respiratory evaluation clinic in [place] should he wish to follow-up further regarding COVID-19 testing.     ____________________________________________  FINAL CLINICAL IMPRESSION(S) / ED DIAGNOSES  Final diagnoses:  Mild intermittent asthma with exacerbation     MEDICATIONS GIVEN DURING THIS VISIT:  Medications  ipratropium-albuterol (DUONEB) 0.5-2.5 (3) MG/3ML nebulizer solution 3 mL (3 mLs Nebulization Given 08/28/18 0617)  ipratropium-albuterol (DUONEB) 0.5-2.5 (3) MG/3ML nebulizer solution 3 mL (3 mLs Nebulization Given 08/28/18 0617)  ipratropium-albuterol (DUONEB) 0.5-2.5 (3) MG/3ML nebulizer solution 3 mL (3 mLs Nebulization Given 08/28/18 0617)  predniSONE (DELTASONE) tablet 60 mg (60 mg Oral Given 08/28/18 0616)     ED Discharge Orders         Ordered    predniSONE (DELTASONE) 20 MG tablet  Daily     08/28/18 0613    albuterol (PROVENTIL HFA;VENTOLIN HFA) 108 (90 Base) MCG/ACT inhaler     08/28/18 1194           Note:  This document was prepared using Dragon voice recognition software and may include unintentional dictation errors.   Hinda Kehr, MD 08/28/18 [phone]

## 2018-08-28 NOTE — Discharge Instructions (Signed)
As we discussed, we believe your symptoms are caused by an exacerbation of your asthma, and/or possibly a respiratory virus (a cold).  Your medical workup and evaluation was reassuring here in the Emergency Department and there is no indication for you to stay in the hospital.  You can follow up as an outpatient and stay your regular medications as well as any new medications prescribed tonight.  Please remember to drink plenty of fluids.  We currently do not have the capacity to test everyone who comes to the emergency department for the virus that causes COVID-19 or to do drive up or drive-through testing, and you are at low risk.  However, there is a drive up testing center in [place] at [place].  [facility] is also doing a walk-up respiratory infection clinic and testing.  Once you arrive to the main [facility] in [place] (near [place]), do not go to the emergency department (unless you are having an emergency), but look around for the signs directing people to the respiratory infection testing center.  We cannot guarantee that they will test you, but these are two options available to you if you wish to pursue COVID-19 testing.  If you have other worsening signs or symptoms that concern you, please return to your nearest emergency department.

## 2018-09-14 ENCOUNTER — Other Ambulatory Visit: Payer: Self-pay

## 2018-09-14 ENCOUNTER — Encounter: Payer: Self-pay | Admitting: Emergency Medicine

## 2018-09-14 ENCOUNTER — Emergency Department
Admission: EM | Admit: 2018-09-14 | Discharge: 2018-09-14 | Disposition: A | Discharge status: Home / Self Care | Payer: Self-pay | Attending: Emergency Medicine | Admitting: Emergency Medicine

## 2018-09-14 ENCOUNTER — Emergency Department: Payer: Self-pay

## 2018-09-14 DIAGNOSIS — Z79899 Other long term (current) drug therapy: Secondary | ICD-10-CM | POA: Insufficient documentation

## 2018-09-14 DIAGNOSIS — J4521 Mild intermittent asthma with (acute) exacerbation: Secondary | ICD-10-CM | POA: Insufficient documentation

## 2018-09-14 DIAGNOSIS — Z87891 Personal history of nicotine dependence: Secondary | ICD-10-CM | POA: Insufficient documentation

## 2018-09-14 MED ORDER — PREDNISONE 50 MG PO TABS: 50.0000 mg | ORAL_TABLET | Freq: Every day | ORAL | 0 refills | Status: DC

## 2018-09-14 MED ORDER — PREDNISONE 20 MG PO TABS
50.0000 mg | ORAL_TABLET | Freq: Once | ORAL | Status: AC
  Administered 2018-09-14: 50 mg via ORAL

## 2018-09-14 NOTE — ED Triage Notes (Signed)
Presents with SOB and body aches   States sx;s started about 1 week ago  Became worse this   Having pain to chest with inspiration

## 2018-09-14 NOTE — ED Provider Notes (Signed)
[facility] Emergency Department Provider Note   ____________________________________________    I have reviewed the triage vital signs and the nursing notes.   HISTORY  Chief Complaint Shortness of breath    HPI Shane Leach is a 33 y.o. male with a history of asthma and epilepsy who presents today with worsening shortness of breath over the last 2 to 3 days.  He does describe some body aches as well but reports "I always ache ".  Reports he typically at baseline uses his inhaler in the mornings but recently he has been using it more frequently throughout the day with significant improvement.  No fevers reported.  No exposure to novel coronavirus patient.  No recent travel.  Past Medical History:  Diagnosis Date  . Asthma   . Bronchitis   . Epilepsy ([place])   . Seizures [facility])     Patient Active Problem List   Diagnosis Date Noted  . Asthma exacerbation 11/17/2016    Past Surgical History:  Procedure Laterality Date  . testicular cancer     right testicle removed    Prior to Admission medications   Medication Sig Start Date End Date Taking? Authorizing Provider  albuterol (PROVENTIL HFA;VENTOLIN HFA) 108 (90 Base) MCG/ACT inhaler Inhale 2-4 puffs by mouth every 4 hours as needed for wheezing, cough, and/or shortness of breath 08/28/18   Hinda Kehr, MD  clindamycin (CLEOCIN) 150 MG capsule Take 2 capsules (300 mg total) by mouth 3 (three) times daily. Patient not taking: Reported on 04/15/2018 11/11/17   Versie Starks, PA-C  cyclobenzaprine (FLEXERIL) 5 MG tablet Take 1-2 tablets 3 times daily as needed 04/06/18   Laban Emperor, PA-C  HYDROcodone-acetaminophen (NORCO/VICODIN) 5-325 MG tablet Take 1 tablet by mouth every 6 (six) hours as needed for moderate pain. Patient not taking: Reported on 04/15/2018 11/11/17   Versie Starks, PA-C  ibuprofen (ADVIL,MOTRIN) 600 MG tablet Take 1 tablet (600 mg total) by mouth every 6 (six) hours as  needed. 05/10/18   Arta Silence, MD  ipratropium-albuterol (DUONEB) 0.5-2.5 (3) MG/3ML SOLN Take 3 mLs by nebulization every 6 (six) hours as needed. Patient not taking: Reported on 04/15/2018 01/15/18   Earleen Newport, MD  levETIRAcetam (KEPPRA) 1000 MG tablet Take 1 tablet (1,000 mg total) by mouth 2 (two) times daily. 04/15/18 04/15/19  Rudene Re, MD  polyethylene glycol [facility]) packet Take 17 g by mouth daily. Patient not taking: Reported on 04/15/2018 04/06/18   Laban Emperor, PA-C  predniSONE (DELTASONE) 50 MG tablet Take 1 tablet (50 mg total) by mouth daily with breakfast. 09/14/18   Lavonia Drafts, MD     Allergies Dilantin [phenytoin sodium extended]; Penicillins; and Tegretol [carbamazepine]  No family history on file.  Social History Social History   Tobacco Use  . Smoking status: Former Smoker    Packs/day: 0.50    Types: Cigarettes    Last attempt to quit: 10/12/2014    Years since quitting: 3.9  . Smokeless tobacco: Never Used  Substance Use Topics  . Alcohol use: Yes  . Drug use: Not Currently    Types: Marijuana    Review of Systems  Constitutional: No fever  ENT: No sore throat. Respiratory: Feels tight  Gastrointestinal: No abdominal pain.  No nausea, no vomiting.   Genitourinary: Negative for dysuria. Musculoskeletal: Negative for back pain. Skin: Negative for rash. Neurological: Negative for headaches     ____________________________________________   PHYSICAL EXAM:  VITAL SIGNS: ED Triage Vitals  Enc Vitals Group     BP 09/14/18 0854 138/90     Pulse Rate 09/14/18 0854 70     Resp 09/14/18 0854 18     Temp 09/14/18 0854 98 F (36.7 C)     Temp Source 09/14/18 0854 Oral     SpO2 09/14/18 0854 98 %     Weight 09/14/18 0853 108.9 kg (240 lb)     Height 09/14/18 0853 1.829 m (6')     Head Circumference --      Peak Flow --      Pain Score 09/14/18 0853 9     Pain Loc --      Pain Edu? --      Excl. in [place]? --      Constitutional: Alert and oriented. No acute distress.  Eyes: Conjunctivae are normal.   Nose: No congestion/rhinnorhea. Mouth/Throat: Mucous membranes are moist.   Cardiovascular: Normal rate, regular rhythm.  Respiratory: Normal respiratory effort.  No retractions.  Scattered wheezes  Musculoskeletal: No lower extremity tenderness nor edema.   Neurologic:  Normal speech and language. No gross focal neurologic deficits are appreciated.   Skin:  Skin is warm, dry and intact. No rash noted.   ____________________________________________   LABS (all labs ordered are listed, but only abnormal results are displayed)  Labs Reviewed - No data to display ____________________________________________  EKG   ____________________________________________  RADIOLOGY  Chest x-ray unremarkable ____________________________________________   PROCEDURES  Procedure(s) performed: No  Procedures   Critical Care performed: No ____________________________________________   INITIAL IMPRESSION / ASSESSMENT AND PLAN / ED COURSE  Pertinent labs & imaging results that were available during my care of the patient were reviewed by me and considered in my medical decision making (see chart for details).  Patient well-appearing and in no acute distress.  Vital signs reassuring, mild wheezing on exam.  Afebrile, appears more consistent with asthma exacerbation, possibly related to significant pollen counts at this time.  Novel coronavirus is certainly a possibility but seems less likely.  Will treat with prednisone, recommend inhalers, strict return precautions and indicated that daily temperature checks necessary  Shane Leach was evaluated in Emergency Department on 09/14/2018 for the symptoms described in the history of present illness. He was evaluated in the context of the global COVID-19 pandemic, which necessitated consideration that the patient might be at risk for infection with the  SARS-CoV-2 virus that causes COVID-19. Institutional protocols and algorithms that pertain to the evaluation of patients at risk for COVID-19 are in a state of rapid change based on information released by regulatory bodies including the CDC and federal and state organizations. These policies and algorithms were followed during the patient's care in the ED.    ____________________________________________   FINAL CLINICAL IMPRESSION(S) / ED DIAGNOSES  Final diagnoses:  Exacerbation of intermittent asthma, unspecified asthma severity      NEW MEDICATIONS STARTED DURING THIS VISIT:  New Prescriptions   PREDNISONE (DELTASONE) 50 MG TABLET    Take 1 tablet (50 mg total) by mouth daily with breakfast.     Note:  This document was prepared using Dragon voice recognition software and may include unintentional dictation errors.   Lavonia Drafts, MD 09/14/18 [phone]

## 2018-09-14 NOTE — ED Notes (Addendum)
First Nurse Note: Patient states he is [facility] and body aches and feels like he may have had a seizure. Hx of epilepsy per patient.  Placed in [place].  Speech clear, speaking in full sentences and on cellphone at arrival.   Placed in [facility].  NAD.

## 2018-10-05 ENCOUNTER — Other Ambulatory Visit: Payer: Self-pay

## 2018-10-05 ENCOUNTER — Emergency Department
Admission: EM | Admit: 2018-10-05 | Discharge: 2018-10-05 | Disposition: A | Discharge status: Home / Self Care | Payer: Self-pay | Attending: Emergency Medicine | Admitting: Emergency Medicine

## 2018-10-05 DIAGNOSIS — Z87891 Personal history of nicotine dependence: Secondary | ICD-10-CM | POA: Insufficient documentation

## 2018-10-05 DIAGNOSIS — Z79899 Other long term (current) drug therapy: Secondary | ICD-10-CM | POA: Insufficient documentation

## 2018-10-05 DIAGNOSIS — J4541 Moderate persistent asthma with (acute) exacerbation: Secondary | ICD-10-CM | POA: Insufficient documentation

## 2018-10-05 MED ORDER — IPRATROPIUM-ALBUTEROL 0.5-2.5 (3) MG/3ML IN SOLN
3.0000 mL | Freq: Once | RESPIRATORY_TRACT | Status: AC
  Administered 2018-10-05: 06:00:00 3 mL via RESPIRATORY_TRACT
  Filled 2018-10-05: qty 3

## 2018-10-05 MED ORDER — ALBUTEROL SULFATE HFA 108 (90 BASE) MCG/ACT IN AERS: INHALATION_SPRAY | RESPIRATORY_TRACT | 1 refills | Status: DC

## 2018-10-05 MED ORDER — PREDNISONE 20 MG PO TABS
60.0000 mg | ORAL_TABLET | ORAL | Status: AC
  Administered 2018-10-05: 06:00:00 60 mg via ORAL
  Filled 2018-10-05: qty 3

## 2018-10-05 MED ORDER — PREDNISONE 10 MG PO TABS: ORAL_TABLET | ORAL | 0 refills | Status: DC

## 2018-10-05 NOTE — ED Triage Notes (Signed)
Patient to ED with complaint of shortness of breath since last pm.  Feels like its his asthma.  Denies any accompanying symptoms.

## 2018-10-05 NOTE — ED Provider Notes (Signed)
[facility] Emergency Department Provider Note  ____________________________________________   First MD Initiated Contact with Patient 10/05/18 [phone]     (approximate)  I have reviewed the triage vital signs and the nursing notes.   HISTORY  Chief Complaint Shortness of Breath    HPI Shane Leach is a 33 y.o. male with medical history as listed below with medical history as listed below who presents by private vehicle for evaluation of gradually worsening shortness of breath over the last week.  It feels consistent with his prior asthma exacerbations.  He has an albuterol inhaler at home and he is used it frequently but it is not helping.  Any amount of exertion exacerbates the symptoms and rest makes it a little bit better.  He was last seen in the ED about 3 weeks ago and did better after a course of steroids but then his symptoms are worsening again over the last week.  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  He describes the symptoms as moderate to severe.   He has been having wheezing.        Past Medical History:  Diagnosis Date  . Asthma   . Bronchitis   . Epilepsy ([place])   . Seizures [facility])     Patient Active Problem List   Diagnosis Date Noted  . Asthma exacerbation 11/17/2016    Past Surgical History:  Procedure Laterality Date  . testicular cancer     right testicle removed    Prior to Admission medications   Medication Sig Start Date End Date Taking? Authorizing Provider  albuterol (PROVENTIL HFA;VENTOLIN HFA) 108 (90 Base) MCG/ACT inhaler Inhale 2-4 puffs by mouth every 4 hours as needed for wheezing, cough, and/or shortness of breath 08/28/18   Hinda Kehr, MD  albuterol (VENTOLIN HFA) 108 (90 Base)  MCG/ACT inhaler Inhale 2-4 puffs by mouth every 4 hours as needed for wheezing, cough, and/or shortness of breath 10/05/18   Hinda Kehr, MD  clindamycin (CLEOCIN) 150 MG capsule Take 2 capsules (300 mg total) by mouth 3 (three) times daily. Patient not taking: Reported on 04/15/2018 11/11/17   Versie Starks, PA-C  cyclobenzaprine (FLEXERIL) 5 MG tablet Take 1-2 tablets 3 times daily as needed 04/06/18   Laban Emperor, PA-C  HYDROcodone-acetaminophen (NORCO/VICODIN) 5-325 MG tablet Take 1 tablet by mouth every 6 (six) hours as needed for moderate pain. Patient not taking: Reported on 04/15/2018 11/11/17   Versie Starks, PA-C  ibuprofen (ADVIL,MOTRIN) 600 MG tablet Take 1 tablet (600 mg total) by mouth every 6 (six) hours as needed. 05/10/18   Arta Silence, MD  ipratropium-albuterol (DUONEB) 0.5-2.5 (3) MG/3ML SOLN Take 3 mLs by nebulization every 6 (six) hours as needed. Patient not taking: Reported on 04/15/2018 01/15/18   Earleen Newport, MD  levETIRAcetam (KEPPRA) 1000 MG tablet Take 1 tablet (1,000 mg total) by mouth 2 (two) times daily. 04/15/18 04/15/19  Rudene Re, MD  polyethylene glycol [facility]) packet Take 17 g by mouth daily. Patient not taking: Reported on 04/15/2018 04/06/18   Laban Emperor, PA-C  predniSONE (DELTASONE) 10 MG tablet Take 6 tabs (60 mg) PO x 3 days, then take 4 tabs (40 mg) PO x 3 days, then take 2 tabs (20 mg)  PO x 3 days, then take 1 tab (10 mg) PO x 3 days, then take 1/2 tab (5 mg) PO x 4 days. 10/05/18   Hinda Kehr, MD    Allergies Dilantin [phenytoin sodium extended]; Penicillins; and Tegretol [carbamazepine]  No family history on file.  Social History Social History   Tobacco Use  . Smoking status: Former Smoker    Packs/day: 0.50    Types: Cigarettes    Last attempt to quit: 10/12/2014    Years since quitting: 3.9  . Smokeless tobacco: Never Used  Substance Use Topics  . Alcohol use: Yes  . Drug use: Not Currently    Types: Marijuana     Review of Systems Constitutional: No fever/chills Eyes: No visual changes. ENT: No sore throat. Cardiovascular: Denies chest pain. Respiratory: Worsening shortness of breath over the last week similar to prior asthma exacerbations.  Gastrointestinal: No abdominal pain.  No nausea, no vomiting.  No diarrhea.  No constipation. Genitourinary: Negative for dysuria. Musculoskeletal: Negative for neck pain.  Negative for back pain. Integumentary: Negative for rash. Neurological: Negative for headaches, focal weakness or numbness.   ____________________________________________   PHYSICAL EXAM:  VITAL SIGNS: ED Triage Vitals  Enc Vitals Group     BP 10/05/18 0544 (!) 135/101     Pulse Rate 10/05/18 0544 67     Resp 10/05/18 0544 (!) 24     Temp 10/05/18 0544 97.6 F (36.4 C)     Temp Source 10/05/18 0544 Oral     SpO2 10/05/18 0544 98 %     Weight --      Height --      Head Circumference --      Peak Flow --      Pain Score 10/05/18 0543 0     Pain Loc --      Pain Edu? --      Excl. in [place]? --     Constitutional: Alert and oriented. Well appearing and in no acute distress. Eyes: Conjunctivae are normal.  Head: Atraumatic. Nose: No congestion/rhinnorhea. Mouth/Throat: Mucous membranes are moist. Neck: No stridor.  No meningeal signs.   Cardiovascular: Normal rate, regular rhythm. Good peripheral circulation. Grossly normal heart sounds. Respiratory: Normal respiratory effort.  No retractions.  Patient has expiratory wheezing throughout lung fields consistent with an asthma exacerbation.  No coarse breath sounds. Gastrointestinal: Soft and nontender. No distention.  Musculoskeletal: No lower extremity tenderness nor edema. No gross deformities of extremities. Neurologic:  Normal speech and language. No gross focal neurologic deficits are appreciated.  Skin:  Skin is warm, dry and intact. No rash noted. Psychiatric: Mood and affect are normal. Speech and behavior are  normal.  ____________________________________________   LABS (all labs ordered are listed, but only abnormal results are displayed)  Labs Reviewed - No data to display ____________________________________________  EKG  None - EKG not ordered by ED physician ____________________________________________  RADIOLOGY I, Hinda Kehr, personally viewed and evaluated these images (plain radiographs) as part of my medical decision making, as well as reviewing the written report by the radiologist.  ED MD interpretation: No indication for imaging  Official radiology report(s): No results found.  ____________________________________________   PROCEDURES   Procedure(s) performed (including Critical Care):  Procedures   ____________________________________________   INITIAL IMPRESSION / MDM / [place] / ED COURSE  As part of my medical decision making, I reviewed the following data within the [place] notes reviewed and incorporated, Old chart reviewed and  Notes from prior ED visits      Shane Leach was evaluated in Emergency Department on 10/05/2018 for the symptoms described in the history of present illness. He was evaluated in the context of the global COVID-19 pandemic, which necessitated consideration that the patient might be at risk for infection with the SARS-CoV-2 virus that causes COVID-19. Institutional protocols and algorithms that pertain to the evaluation of patients at risk for COVID-19 are in a state of rapid change based on information released by regulatory bodies including the CDC and federal and state organizations. These policies and algorithms were followed during the patient's care in the ED.  Differential diagnosis includes, but is not limited to, asthma exacerbation, COVID-19 or other viral syndrome, pneumonia.  The patient is well-appearing and in no distress although he is wheezing.  Given that his inhaler has  not been working sufficiently, I will give him 3 breathing treatments and a taper of steroids, but based on his symptoms I have little to no concern for coronavirus and he does not meet criteria for testing.  He has had 7 visits within 6 months for asthma exacerbations and he says that this feels similar.  I stressed to him the importance of establishing outpatient care either with a PCP or with a pulmonologist and he says that he understands.  No indication for imaging.  I will give duo nebs x3, prednisone 60 mg by mouth, and prescriptions as listed below.  I gave strict return precautions and he understands and agrees with the plan.      ____________________________________________  FINAL CLINICAL IMPRESSION(S) / ED DIAGNOSES  Final diagnoses:  Moderate persistent asthma with exacerbation     MEDICATIONS GIVEN DURING THIS VISIT:  Medications  ipratropium-albuterol (DUONEB) 0.5-2.5 (3) MG/3ML nebulizer solution 3 mL (3 mLs Nebulization Given 10/05/18 0607)  ipratropium-albuterol (DUONEB) 0.5-2.5 (3) MG/3ML nebulizer solution 3 mL (3 mLs Nebulization Given 10/05/18 0607)  ipratropium-albuterol (DUONEB) 0.5-2.5 (3) MG/3ML nebulizer solution 3 mL (3 mLs Nebulization Given 10/05/18 0607)  predniSONE (DELTASONE) tablet 60 mg (60 mg Oral Given 10/05/18 0606)     ED Discharge Orders         Ordered    predniSONE (DELTASONE) 10 MG tablet     10/05/18 0604    albuterol (VENTOLIN HFA) 108 (90 Base) MCG/ACT inhaler     10/05/18 0604           Note:  This document was prepared using Dragon voice recognition software and may include unintentional dictation errors.   Hinda Kehr, MD 10/05/18 [phone]

## 2018-10-05 NOTE — Discharge Instructions (Signed)
We believe that your symptoms are caused today by an exacerbation of your asthma.  Please take the prescribed medications and any medications that you have at home.  Follow up with your doctor as recommended.  If you develop any new or worsening symptoms, including but not limited to fever, persistent vomiting, worsening shortness of breath, or other symptoms that concern you, please return to the Emergency Department immediately.  

## 2019-01-11 ENCOUNTER — Other Ambulatory Visit: Payer: Self-pay

## 2019-01-11 ENCOUNTER — Emergency Department
Admission: EM | Admit: 2019-01-11 | Discharge: 2019-01-11 | Disposition: A | Discharge status: Home / Self Care | Payer: Self-pay | Attending: Emergency Medicine | Admitting: Emergency Medicine

## 2019-01-11 ENCOUNTER — Encounter: Payer: Self-pay | Admitting: Emergency Medicine

## 2019-01-11 DIAGNOSIS — J45901 Unspecified asthma with (acute) exacerbation: Secondary | ICD-10-CM | POA: Insufficient documentation

## 2019-01-11 DIAGNOSIS — Z87891 Personal history of nicotine dependence: Secondary | ICD-10-CM | POA: Insufficient documentation

## 2019-01-11 DIAGNOSIS — Z79899 Other long term (current) drug therapy: Secondary | ICD-10-CM | POA: Insufficient documentation

## 2019-01-11 MED ORDER — IPRATROPIUM-ALBUTEROL 0.5-2.5 (3) MG/3ML IN SOLN
3.0000 mL | Freq: Once | RESPIRATORY_TRACT | Status: AC
  Administered 2019-01-11: 3 mL via RESPIRATORY_TRACT
  Filled 2019-01-11: qty 3

## 2019-01-11 MED ORDER — ALBUTEROL SULFATE (2.5 MG/3ML) 0.083% IN NEBU
INHALATION_SOLUTION | RESPIRATORY_TRACT | Status: AC
  Filled 2019-01-11: qty 6

## 2019-01-11 MED ORDER — ALBUTEROL SULFATE HFA 108 (90 BASE) MCG/ACT IN AERS: INHALATION_SPRAY | RESPIRATORY_TRACT | 2 refills | Status: DC

## 2019-01-11 MED ORDER — PREDNISONE 20 MG PO TABS: 60.0000 mg | ORAL_TABLET | Freq: Every day | ORAL | 0 refills | Status: AC

## 2019-01-11 MED ORDER — PREDNISONE 20 MG PO TABS
60.0000 mg | ORAL_TABLET | Freq: Once | ORAL | Status: AC
  Administered 2019-01-11: 60 mg via ORAL
  Filled 2019-01-11: qty 3

## 2019-01-11 MED ORDER — ALBUTEROL SULFATE (2.5 MG/3ML) 0.083% IN NEBU
5.0000 mg | INHALATION_SOLUTION | Freq: Once | RESPIRATORY_TRACT | Status: AC
  Administered 2019-01-11: 07:00:00 5 mg via RESPIRATORY_TRACT

## 2019-01-11 NOTE — ED Notes (Signed)
Report to [place] and [place], rn.

## 2019-01-11 NOTE — ED Provider Notes (Signed)
[facility] Emergency Department Provider Note  ____________________________________________  Time seen: Approximately 5:49 AM  I have reviewed the triage vital signs and the nursing notes.   HISTORY  Chief Complaint Shortness of Breath   HPI Shane Leach is a 33 y.o. male with a history of asthma and epilepsy who presents for evaluation of shortness of breath.  Patient endorses progressively worsening shortness of breath for the last week and markedly worse over the last 2 days.  He has been wheezing.  Unfortunately he ran out of his albuterol and has not been using it at home.  He does not smoke.  He is complaining of chest tightness associated with his shortness of breath.  At this time the shortness of breath is moderate in intensity.  He has had a mild cough that is nonproductive, no fever or chills, no exposure to COVID, no vomiting or diarrhea.  No personal family history of blood clots, no recent travel immobilization, no leg pain or swelling, no hemoptysis, no exogenous hormones.   Past Medical History:  Diagnosis Date  . Asthma   . Bronchitis   . Epilepsy ([place])   . Seizures [facility])     Patient Active Problem List   Diagnosis Date Noted  . Asthma exacerbation 11/17/2016    Past Surgical History:  Procedure Laterality Date  . testicular cancer     right testicle removed    Prior to Admission medications   Medication Sig Start Date End Date Taking? Authorizing Provider  albuterol (VENTOLIN HFA) 108 (90 Base) MCG/ACT inhaler Inhale 2-4 puffs by mouth every 4 hours as needed for wheezing, cough, and/or shortness of breath 01/11/19   Alfred Levins, [place], MD  clindamycin (CLEOCIN) 150 MG capsule Take 2 capsules (300 mg total) by mouth 3 (three) times daily. Patient not taking: Reported on 04/15/2018 11/11/17   Versie Starks, PA-C  cyclobenzaprine (FLEXERIL) 5 MG tablet Take 1-2 tablets 3 times daily as needed 04/06/18   Laban Emperor, PA-C   HYDROcodone-acetaminophen (NORCO/VICODIN) 5-325 MG tablet Take 1 tablet by mouth every 6 (six) hours as needed for moderate pain. Patient not taking: Reported on 04/15/2018 11/11/17   Versie Starks, PA-C  ibuprofen (ADVIL,MOTRIN) 600 MG tablet Take 1 tablet (600 mg total) by mouth every 6 (six) hours as needed. 05/10/18   Arta Silence, MD  ipratropium-albuterol (DUONEB) 0.5-2.5 (3) MG/3ML SOLN Take 3 mLs by nebulization every 6 (six) hours as needed. Patient not taking: Reported on 04/15/2018 01/15/18   Earleen Newport, MD  levETIRAcetam (KEPPRA) 1000 MG tablet Take 1 tablet (1,000 mg total) by mouth 2 (two) times daily. 04/15/18 04/15/19  Rudene Re, MD  polyethylene glycol [facility] packet Take 17 g by mouth daily. Patient not taking: Reported on 04/15/2018 04/06/18   Laban Emperor, PA-C  predniSONE (DELTASONE) 20 MG tablet Take 3 tablets (60 mg total) by mouth daily for 4 days. 01/11/19 01/15/19  Rudene Re, MD    Allergies Dilantin [phenytoin sodium extended], Penicillins, and Tegretol [carbamazepine]  No family history on file.  Social History Social History   Tobacco Use  . Smoking status: Former Smoker    Packs/day: 0.50    Types: Cigarettes    Quit date: 10/12/2014    Years since quitting: 4.2  . Smokeless tobacco: Never Used  Substance Use Topics  . Alcohol use: Not Currently  . Drug use: Not Currently    Types: Marijuana    Review of Systems  Constitutional: Negative for fever.  Eyes: Negative for visual changes. ENT: Negative for sore throat. Neck: No neck pain  Cardiovascular: + chest tightness. Respiratory: + shortness of breath, cough, wheezing Gastrointestinal: Negative for abdominal pain, vomiting or diarrhea. Genitourinary: Negative for dysuria. Musculoskeletal: Negative for back pain. Skin: Negative for rash. Neurological: Negative for headaches, weakness or numbness. Psych: No SI or HI  ____________________________________________    PHYSICAL EXAM:  VITAL SIGNS: ED Triage Vitals  Enc Vitals Group     BP 01/11/19 0539 116/78     Pulse Rate 01/11/19 0539 79     Resp 01/11/19 0539 (!) 21     Temp 01/11/19 0539 98.3 F (36.8 C)     Temp src --      SpO2 01/11/19 0539 95 %     Weight 01/11/19 0536 220 lb (99.8 kg)     Height 01/11/19 0536 5\' 10"  (1.778 m)     Head Circumference --      Peak Flow --      Pain Score 01/11/19 0535 0     Pain Loc --      Pain Edu? --      Excl. in [place]? --     Constitutional: Alert and oriented. Well appearing and in no apparent distress. HEENT:      Head: Normocephalic and atraumatic.         Eyes: Conjunctivae are normal. Sclera is non-icteric.       Mouth/Throat: Mucous membranes are moist.       Neck: Supple with no signs of meningismus. Cardiovascular: Regular rate and rhythm. No murmurs, gallops, or rubs. 2+ symmetrical distal pulses are present in all extremities. No JVD. Respiratory: Slightly increased work of breathing, tachypneic, no hypoxia, decreased air movement bilaterally with diffuse expiratory wheezes.   Gastrointestinal: Soft, non tender, and non distended with positive bowel sounds. No rebound or guarding. Musculoskeletal: Nontender with normal range of motion in all extremities. No edema, cyanosis, or erythema of extremities. Neurologic: Normal speech and language. Face is symmetric. Moving all extremities. No gross focal neurologic deficits are appreciated. Skin: Skin is warm, dry and intact. No rash noted. Psychiatric: Mood and affect are normal. Speech and behavior are normal.  ____________________________________________   LABS (all labs ordered are listed, but only abnormal results are displayed)  Labs Reviewed - No data to display ____________________________________________  EKG  none  ____________________________________________  RADIOLOGY  none  ____________________________________________   PROCEDURES  Procedure(s) performed: None  Procedures Critical Care performed:  None ____________________________________________   INITIAL IMPRESSION / ASSESSMENT AND PLAN / ED COURSE   33 y.o. male with a history of asthma and epilepsy who presents for evaluation of shortness of breath and wheezing consistent with an asthma exacerbation.  No signs or symptoms of infectious etiology.  Patient with mildly increased work of breathing but no distress, normal sats, wheezing bilaterally with decreased air movement.  Will treat with DuoNeb x3 and prednisone.  Will provide patient with a prescription for his inhalers.  Clinical Course as of Jan 10 706  Mon Jan 11, 2019  0630 Patient improved after 3 duo nebs but is still wheezing.  Will give 5 mg of albuterol and reassess   [CV]    Clinical Course User Index [CV] Rudene Re, MD      As part of my medical decision making, I reviewed the following data within the [place] notes reviewed and incorporated, Old chart reviewed, Notes from prior ED visits and [place] Controlled Substance Database  Patient was evaluated in Emergency Department today for the symptoms described in the history of present illness. Patient was evaluated in the context of the global COVID-19 pandemic, which necessitated consideration that the patient might be at risk for infection with the SARS-CoV-2 virus that causes COVID-19. Institutional protocols and algorithms that pertain to the evaluation of patients at risk for COVID-19 are in a state of rapid change based on information released by regulatory bodies including the CDC and federal and state organizations. These policies and algorithms were followed during the patient's care in the ED.   ____________________________________________   FINAL CLINICAL IMPRESSION(S) / ED DIAGNOSES   Final diagnoses:  Exacerbation of asthma, unspecified asthma severity, unspecified whether persistent      NEW MEDICATIONS STARTED DURING  THIS VISIT:  ED Discharge Orders         Ordered    albuterol (VENTOLIN HFA) 108 (90 Base) MCG/ACT inhaler     01/11/19 0548    predniSONE (DELTASONE) 20 MG tablet  Daily     01/11/19 0548           Note:  This document was prepared using Dragon voice recognition software and may include unintentional dictation errors.    Alfred Levins, [place], MD 01/11/19 [phone]

## 2019-01-11 NOTE — ED Notes (Signed)
Pt states shob with "tight" chest for several days. Pt states has been out of asthma medication. Pt without distress, resps unlabored, no cough or fever noted. Pt able to speak in full sentences without difficulty.

## 2019-01-11 NOTE — ED Notes (Signed)
Pt alert and comfortable. States he feels much better. Mild Inspiratory wheezing noted bilaterally.

## 2019-01-11 NOTE — ED Triage Notes (Signed)
Patient with complaint of shortness of breath times two days. Patient states that he has asthma and that he has been using his inhaler but it has not improved with the inhaler.

## 2019-01-27 ENCOUNTER — Ambulatory Visit: Payer: Self-pay

## 2019-02-03 ENCOUNTER — Other Ambulatory Visit: Payer: Self-pay

## 2019-02-03 ENCOUNTER — Ambulatory Visit: Payer: Self-pay

## 2019-02-10 ENCOUNTER — Ambulatory Visit: Payer: Self-pay

## 2019-02-23 ENCOUNTER — Other Ambulatory Visit: Payer: Self-pay

## 2019-02-23 ENCOUNTER — Emergency Department
Admission: EM | Admit: 2019-02-23 | Discharge: 2019-02-23 | Disposition: A | Discharge status: Home / Self Care | Payer: Self-pay | Attending: Emergency Medicine | Admitting: Emergency Medicine

## 2019-02-23 DIAGNOSIS — R0602 Shortness of breath: Secondary | ICD-10-CM | POA: Insufficient documentation

## 2019-02-23 DIAGNOSIS — Z87891 Personal history of nicotine dependence: Secondary | ICD-10-CM | POA: Insufficient documentation

## 2019-02-23 DIAGNOSIS — J4520 Mild intermittent asthma, uncomplicated: Secondary | ICD-10-CM | POA: Insufficient documentation

## 2019-02-23 DIAGNOSIS — Z760 Encounter for issue of repeat prescription: Secondary | ICD-10-CM | POA: Insufficient documentation

## 2019-02-23 DIAGNOSIS — R05 Cough: Secondary | ICD-10-CM | POA: Insufficient documentation

## 2019-02-23 DIAGNOSIS — Z8547 Personal history of malignant neoplasm of testis: Secondary | ICD-10-CM | POA: Insufficient documentation

## 2019-02-23 MED ORDER — IPRATROPIUM-ALBUTEROL 0.5-2.5 (3) MG/3ML IN SOLN: 3.0000 mL | Freq: Four times a day (QID) | RESPIRATORY_TRACT | 2 refills | Status: DC | PRN

## 2019-02-23 MED ORDER — IPRATROPIUM-ALBUTEROL 0.5-2.5 (3) MG/3ML IN SOLN
3.0000 mL | Freq: Once | RESPIRATORY_TRACT | Status: AC
  Administered 2019-02-23: 10:00:00 3 mL via RESPIRATORY_TRACT
  Filled 2019-02-23: qty 3

## 2019-02-23 NOTE — Discharge Instructions (Signed)
Ad[facility] to have [facility] renewed before coming to the emergency room.

## 2019-02-23 NOTE — ED Notes (Signed)
See triage note  States his asthma as been acting up recently  Has been using his inhalers  But is out of Duo neb solution   No resp distress noted on arrival

## 2019-02-23 NOTE — ED Triage Notes (Signed)
Pt states his asthma has flared up over the past week and is out of his nebulizer treatments. Pt is in NAD on arrival . States he did use his inhaler today.

## 2019-02-23 NOTE — ED Provider Notes (Signed)
[facility] Emergency Department Provider Note   ____________________________________________   First MD Initiated Contact with Patient 02/23/19 [phone]     (approximate)  I have reviewed the triage vital signs and the nursing notes.   HISTORY  Chief Complaint Asthma    HPI Shane Leach is a 33 y.o. male patient presents with mild wheezing and shortness of breath.  Patient also has mild cough associated complaint.  Patient denies fever or chills.  Patient states no exposure to COVID-19.  No recent travel.  Patient state he is out of his nebulizer solution.     Past Medical History:  Diagnosis Date  . Asthma   . Bronchitis   . Epilepsy ([place])   . Seizures [facility])     Patient Active Problem List   Diagnosis Date Noted  . Asthma exacerbation 11/17/2016    Past Surgical History:  Procedure Laterality Date  . testicular cancer     right testicle removed    Prior to Admission medications   Medication Sig Start Date End Date Taking? Authorizing Provider  albuterol (VENTOLIN HFA) 108 (90 Base) MCG/ACT inhaler Inhale 2-4 puffs by mouth every 4 hours as needed for wheezing, cough, and/or shortness of breath 01/11/19   Alfred Levins, [place], MD  cyclobenzaprine (FLEXERIL) 5 MG tablet Take 1-2 tablets 3 times daily as needed 04/06/18   Laban Emperor, PA-C  ipratropium-albuterol (DUONEB) 0.5-2.5 (3) MG/3ML SOLN Take 3 mLs by nebulization every 6 (six) hours as needed. 02/23/19   Sable Feil, PA-C  levETIRAcetam (KEPPRA) 1000 MG tablet Take 1 tablet (1,000 mg total) by mouth 2 (two) times daily. 04/15/18 04/15/19  Rudene Re, MD    Allergies Dilantin [phenytoin sodium extended], Penicillins, and Tegretol [carbamazepine]  No family history on file.  Social History Social History   Tobacco Use  . Smoking status: Former Smoker    Packs/day: 0.50    Types: Cigarettes    Quit date: 10/12/2014    Years since quitting: 4.3  . Smokeless tobacco:  Never Used  Substance Use Topics  . Alcohol use: Not Currently  . Drug use: Not Currently    Types: Marijuana    Review of Systems Constitutional: No fever/chills Eyes: No visual changes. ENT: No sore throat. Cardiovascular: Denies chest pain. Respiratory: Mild shortness of breath.  Mild wheezing. Gastrointestinal: No abdominal pain.  No nausea, no vomiting.  No diarrhea.  No constipation. Genitourinary: Negative for dysuria. Musculoskeletal: Negative for back pain. Skin: Negative for rash. Neurological: Negative for headaches, focal weakness or numbness. Allergic/Immunilogical: See medication list. ____________________________________________   PHYSICAL EXAM:  VITAL SIGNS: ED Triage Vitals  Enc Vitals Group     BP 02/23/19 0943 (!) 153/102     Pulse Rate 02/23/19 0943 76     Resp 02/23/19 0943 18     Temp 02/23/19 0943 98 F (36.7 C)     Temp Source 02/23/19 0943 Oral     SpO2 02/23/19 0943 98 %     Weight 02/23/19 0945 240 lb (108.9 kg)     Height 02/23/19 0945 6' (1.829 m)     Head Circumference --      Peak Flow --      Pain Score 02/23/19 0944 5     Pain Loc --      Pain Edu? --      Excl. in [place]? --     Constitutional: Alert and oriented. Well appearing and in no acute distress. Neck: No stridor.  Cardiovascular: Normal rate, regular rhythm. Grossly normal heart sounds.  Good peripheral circulation. Respiratory: Normal respiratory effort.  No retractions. Lungs with mild expiratory wheezing. Neurologic:  Normal speech and language. No gross focal neurologic deficits are appreciated. No gait instability. Skin:  Skin is warm, dry and intact. No rash noted.  ____________________________________________   LABS (all labs ordered are listed, but only abnormal results are displayed)  Labs Reviewed - No data to display ____________________________________________  EKG   ____________________________________________  RADIOLOGY  ED MD interpretation:     Official radiology report(s): No results found.  ____________________________________________   PROCEDURES  Procedure(s) performed (including Critical Care):  Procedures   ____________________________________________   INITIAL IMPRESSION / ASSESSMENT AND PLAN / ED COURSE  As part of my medical decision making, I reviewed the following data within the [place] was evaluated in Emergency Department on 02/23/2019 for the symptoms described in the history of present illness. He was evaluated in the context of the global COVID-19 pandemic, which necessitated consideration that the patient might be at risk for infection with the SARS-CoV-2 virus that causes COVID-19. Institutional protocols and algorithms that pertain to the evaluation of patients at risk for COVID-19 are in a state of rapid change based on information released by regulatory bodies including the CDC and federal and state organizations. These policies and algorithms were followed during the patient's care in the ED.   Patient presents for mild expiratory wheezing.  Patient requesting refill of his nebulizer solution.  Patient given DuoNeb treatment prior to departure.  Patient medication was refilled.  Patient [facility].      ____________________________________________   FINAL CLINICAL IMPRESSION(S) / ED DIAGNOSES  Final diagnoses:  Mild intermittent asthma without complication  Medication refill     ED Discharge Orders         Ordered    ipratropium-albuterol (DUONEB) 0.5-2.5 (3) MG/3ML SOLN  Every 6 hours PRN     02/23/19 1006           Note:  This document was prepared using Dragon voice recognition software and may include unintentional dictation errors.    Sable Feil, PA-C 02/23/19 1015    Earleen Newport, MD 02/23/19 [phone]

## 2019-04-02 ENCOUNTER — Emergency Department
Admission: EM | Admit: 2019-04-02 | Discharge: 2019-04-02 | Disposition: A | Discharge status: Home / Self Care | Payer: Self-pay | Attending: Emergency Medicine | Admitting: Emergency Medicine

## 2019-04-02 ENCOUNTER — Other Ambulatory Visit: Payer: Self-pay

## 2019-04-02 ENCOUNTER — Encounter: Payer: Self-pay | Admitting: Emergency Medicine

## 2019-04-02 DIAGNOSIS — R569 Unspecified convulsions: Secondary | ICD-10-CM | POA: Insufficient documentation

## 2019-04-02 DIAGNOSIS — J45909 Unspecified asthma, uncomplicated: Secondary | ICD-10-CM | POA: Insufficient documentation

## 2019-04-02 DIAGNOSIS — Z87891 Personal history of nicotine dependence: Secondary | ICD-10-CM | POA: Insufficient documentation

## 2019-04-02 DIAGNOSIS — Z79899 Other long term (current) drug therapy: Secondary | ICD-10-CM | POA: Insufficient documentation

## 2019-04-02 DIAGNOSIS — F1210 Cannabis abuse, uncomplicated: Secondary | ICD-10-CM | POA: Insufficient documentation

## 2019-04-02 LAB — CBC WITH DIFFERENTIAL/PLATELET
Abs Immature Granulocytes: 0.04 10*3/uL (ref 0.00–0.07)
Basophils Absolute: 0.1 10*3/uL (ref 0.0–0.1)
Basophils Relative: 1 %
Eosinophils Absolute: 0.5 10*3/uL (ref 0.0–0.5)
Eosinophils Relative: 7 %
HCT: 50.6 % (ref 39.0–52.0)
Hemoglobin: 17.1 g/dL — ABNORMAL HIGH (ref 13.0–17.0)
Immature Granulocytes: 1 %
Lymphocytes Relative: 20 %
Lymphs Abs: 1.4 10*3/uL (ref 0.7–4.0)
MCH: 30.5 pg (ref 26.0–34.0)
MCHC: 33.8 g/dL (ref 30.0–36.0)
MCV: 90.2 fL (ref 80.0–100.0)
Monocytes Absolute: 0.4 10*3/uL (ref 0.1–1.0)
Monocytes Relative: 5 %
Neutro Abs: 4.7 10*3/uL (ref 1.7–7.7)
Neutrophils Relative %: 66 %
Platelets: 264 10*3/uL (ref 150–400)
RBC: 5.61 MIL/uL (ref 4.22–5.81)
RDW: 13.8 % (ref 11.5–15.5)
WBC: 7.1 10*3/uL (ref 4.0–10.5)
nRBC: 0 % (ref 0.0–0.2)

## 2019-04-02 LAB — BASIC METABOLIC PANEL
Anion gap: 12 (ref 5–15)
BUN: 11 mg/dL (ref 6–20)
CO2: 21 mmol/L — ABNORMAL LOW (ref 22–32)
Calcium: 9.5 mg/dL (ref 8.9–10.3)
Chloride: 110 mmol/L (ref 98–111)
Creatinine, Ser: 1.26 mg/dL — ABNORMAL HIGH (ref 0.61–1.24)
GFR calc Af Amer: >60 mL/min (ref >60)
GFR calc non Af Amer: >60 mL/min (ref >60)
Glucose, Bld: 94 mg/dL (ref 70–99)
Potassium: 4.4 mmol/L (ref 3.5–5.1)
Sodium: 143 mmol/L (ref 135–145)

## 2019-04-02 MED ORDER — DIPHENHYDRAMINE HCL 50 MG/ML IJ SOLN
25.0000 mg | Freq: Once | INTRAMUSCULAR | Status: AC
  Administered 2019-04-02: 07:00:00 25 mg via INTRAVENOUS

## 2019-04-02 MED ORDER — LORAZEPAM 2 MG/ML IJ SOLN
1.0000 mg | Freq: Once | INTRAMUSCULAR | Status: AC
  Administered 2019-04-02: 07:00:00 1 mg via INTRAVENOUS

## 2019-04-02 MED ORDER — LEVETIRACETAM 1000 MG PO TABS: 1000.0000 mg | ORAL_TABLET | Freq: Three times a day (TID) | ORAL | 6 refills | Status: DC

## 2019-04-02 MED ORDER — LORAZEPAM 2 MG/ML IJ SOLN
INTRAMUSCULAR | Status: AC
  Administered 2019-04-02: 1 mg via INTRAVENOUS
  Filled 2019-04-02: qty 1

## 2019-04-02 MED ORDER — LEVETIRACETAM IN NACL 1500 MG/100ML IV SOLN
1500.0000 mg | Freq: Once | INTRAVENOUS | Status: AC
  Administered 2019-04-02: 1500 mg via INTRAVENOUS
  Filled 2019-04-02: qty 100

## 2019-04-02 MED ORDER — DIPHENHYDRAMINE HCL 50 MG/ML IJ SOLN
INTRAMUSCULAR | Status: AC
  Filled 2019-04-02: qty 1

## 2019-04-02 NOTE — ED Provider Notes (Signed)
Patient observed in the emergency department, no further seizures, he is asking to leave as he feels well.,  Appropriate for discharge at this time   Lavonia Drafts, MD 04/02/19 0930

## 2019-04-02 NOTE — ED Triage Notes (Signed)
Pt to triage via w/c with no distress noted, mask in place; wife reports pt has had 3 seizures since 5am; taking keppra 2000mg  daily; denies any changes to meds or recent illness; st has been years since last seizure

## 2019-04-02 NOTE — ED Provider Notes (Signed)
[facility] Emergency Department Provider Note _____________   First MD Initiated Contact with Patient 04/02/19 [phone]     (approximate)  I have reviewed the triage vital signs and the nursing notes.   HISTORY  Chief Complaint Seizures    HPI Shane Leach is a 33 y.o. male with below list of previous medical conditions including epilepsy for which patient is prescribed Keppra 1000 mg 3 times daily that he has been noncompliant with secondary to "running out".  Patient presents to the emergency department secondary to witness generalized tonic-clonic seizure this morning which began at approximately 5 AM.  Patient spouse at bedside states that total episode lasted 20 minutes with brief episodes of cessation of tonic-clonic activity with the patient regained consciousness during that time.    Past Medical History:  Diagnosis Date  . Asthma   . Bronchitis   . Epilepsy ([place])   . Seizures [facility])     Patient Active Problem List   Diagnosis Date Noted  . Asthma exacerbation 11/17/2016    Past Surgical History:  Procedure Laterality Date  . testicular cancer     right testicle removed    Prior to Admission medications   Medication Sig Start Date End Date Taking? Authorizing Provider  albuterol (VENTOLIN HFA) 108 (90 Base) MCG/ACT inhaler Inhale 2-4 puffs by mouth every 4 hours as needed for wheezing, cough, and/or shortness of breath 01/11/19   Alfred Levins, [place], MD  albuterol (VENTOLIN HFA) 108 (90 Base) MCG/ACT inhaler Inhale 2 puffs into the lungs every 6 (six) hours as needed for wheezing or shortness of breath. 04/04/19   Arta Silence, MD  cyclobenzaprine (FLEXERIL) 5 MG tablet Take 1-2 tablets 3 times daily as needed 04/06/18   Laban Emperor, PA-C  ipratropium-albuterol (DUONEB) 0.5-2.5 (3) MG/3ML SOLN Take 3 mLs by nebulization every 6 (six) hours as needed. 02/23/19   Sable Feil, PA-C  levETIRAcetam (KEPPRA) 1000 MG tablet Take 1  tablet (1,000 mg total) by mouth 3 (three) times daily. 04/02/19 10/29/19  Gregor Hams, MD  predniSONE (DELTASONE) 20 MG tablet Take 3 tablets (60 mg total) by mouth daily for 4 days. Start the day after the ER visit (Monday) 04/05/19 04/09/19  Arta Silence, MD    Allergies Dilantin [phenytoin sodium extended], Penicillins, and Tegretol [carbamazepine]  No family history on file.  Social History Social History   Tobacco Use  . Smoking status: Former Smoker    Packs/day: 0.50    Types: Cigarettes    Quit date: 10/12/2014    Years since quitting: 4.4  . Smokeless tobacco: Never Used  Substance Use Topics  . Alcohol use: Not Currently  . Drug use: Not Currently    Types: Marijuana    Review of Systems Constitutional: No fever/chills Eyes: No visual changes. ENT: No sore throat. Cardiovascular: Denies chest pain. Respiratory: Denies shortness of breath. Gastrointestinal: No abdominal pain.  No nausea, no vomiting.  No diarrhea.  No constipation. Genitourinary: Negative for dysuria. Musculoskeletal: Negative for neck pain.  Negative for back pain. Integumentary: Negative for rash. Neurological: Negative for headaches, focal weakness or numbness.  ____________________________________________   PHYSICAL EXAM:  VITAL SIGNS: ED Triage Vitals  Enc Vitals Group     BP 04/02/19 0642 (!) 128/98     Pulse Rate 04/02/19 0642 78     Resp 04/02/19 0642 18     Temp 04/02/19 0642 97.7 F (36.5 C)     Temp Source 04/02/19 0642 Oral  SpO2 04/02/19 0642 95 %     Weight 04/02/19 0629 97.5 kg (215 lb)     Height 04/02/19 0629 1.829 m (6')     Head Circumference --      Peak Flow --      Pain Score 04/02/19 0634 0     Pain Loc --      Pain Edu? --      Excl. in [place]? --     Constitutional: Alert and oriented.  Eyes: Conjunctivae are normal.  Head: Atraumatic. Mouth/Throat: Patient is wearing a mask. Neck: No stridor.  No meningeal signs.   Cardiovascular: Normal  rate, regular rhythm. Good peripheral circulation. Grossly normal heart sounds. Respiratory: Normal respiratory effort.  No retractions. Gastrointestinal: Soft and nontender. No distention.  Musculoskeletal: No lower extremity tenderness nor edema. No gross deformities of extremities. Neurologic:  Normal speech and language. No gross focal neurologic deficits are appreciated.  Skin:  Skin is warm, dry and intact. Psychiatric: Mood and affect are normal. Speech and behavior are normal.  ____________________________________________   LABS (all labs ordered are listed, but only abnormal results are displayed)  Labs Reviewed  BASIC METABOLIC PANEL - Abnormal; Notable for the following components:      Result Value   CO2 21 (*)    Creatinine, Ser 1.26 (*)    All other components within normal limits  CBC WITH DIFFERENTIAL/PLATELET - Abnormal; Notable for the following components:   Hemoglobin 17.1 (*)    All other components within normal limits  CBG MONITORING, ED   ____________________________________________  EKG  ED ECG REPORT I, [place] N Lytle [place], the attending physician, personally viewed and interpreted this ECG.   Date: 04/02/2019  EKG Time: 6:53 AM  Rate: 84  Rhythm: Normal sinus rhythm  Axis: Normal  Intervals: Normal  ST&T Change: None    Procedures   ____________________________________________   INITIAL IMPRESSION / MDM / ASSESSMENT AND PLAN / ED COURSE  As part of my medical decision making, I reviewed the following data within the electronic MEDICAL RECORD NUMBER   33 year old male presenting with above-stated history and physical exam following multiple episodes of tonic-clonic seizures witnessed by his wife at home.  Patient admits to being noncompliant with Keppra.  Patient given Ativan 1 mg IV as well as 1500 mg of IV Keppra.  Patient's care transferred to Dr. Corky Downs  ____________________________________________  FINAL CLINICAL IMPRESSION(S) / ED  DIAGNOSES  Final diagnoses:  Seizures ([place])     MEDICATIONS GIVEN DURING THIS VISIT:  Medications  LORazepam (ATIVAN) injection 1 mg (1 mg Intravenous Given 04/02/19 0655)  levETIRAcetam (KEPPRA) IVPB 1500 mg/ 100 mL premix (0 mg Intravenous Stopped 04/02/19 0803)  diphenhydrAMINE (BENADRYL) injection 25 mg (25 mg Intravenous Given 04/02/19 0713)     ED Discharge Orders         Ordered    levETIRAcetam (KEPPRA) 1000 MG tablet  3 times daily     04/02/19 0703          *Please note:  Shane Leach was evaluated in Emergency Department on 04/09/2019 for the symptoms described in the history of present illness. He was evaluated in the context of the global COVID-19 pandemic, which necessitated consideration that the patient might be at risk for infection with the SARS-CoV-2 virus that causes COVID-19. Institutional protocols and algorithms that pertain to the evaluation of patients at risk for COVID-19 are in a state of rapid change based on information released by regulatory bodies including the CDC  and federal and state organizations. These policies and algorithms were followed during the patient's care in the ED.  Some ED evaluations and interventions may be delayed as a result of limited staffing during the pandemic.*  Note:  This document was prepared using Dragon voice recognition software and may include unintentional dictation errors.   Gregor Hams, MD 04/09/19 [phone]

## 2019-04-04 ENCOUNTER — Emergency Department: Payer: Self-pay

## 2019-04-04 ENCOUNTER — Emergency Department
Admission: EM | Admit: 2019-04-04 | Discharge: 2019-04-04 | Disposition: A | Discharge status: Home / Self Care | Payer: Self-pay | Attending: Emergency Medicine | Admitting: Emergency Medicine

## 2019-04-04 ENCOUNTER — Other Ambulatory Visit: Payer: Self-pay

## 2019-04-04 DIAGNOSIS — Z87891 Personal history of nicotine dependence: Secondary | ICD-10-CM | POA: Insufficient documentation

## 2019-04-04 DIAGNOSIS — R569 Unspecified convulsions: Secondary | ICD-10-CM | POA: Insufficient documentation

## 2019-04-04 DIAGNOSIS — J45901 Unspecified asthma with (acute) exacerbation: Secondary | ICD-10-CM | POA: Insufficient documentation

## 2019-04-04 DIAGNOSIS — J45909 Unspecified asthma, uncomplicated: Secondary | ICD-10-CM | POA: Insufficient documentation

## 2019-04-04 DIAGNOSIS — G40909 Epilepsy, unspecified, not intractable, without status epilepticus: Secondary | ICD-10-CM

## 2019-04-04 LAB — CBC WITH DIFFERENTIAL/PLATELET
Abs Immature Granulocytes: 0.03 10*3/uL (ref 0.00–0.07)
Basophils Absolute: 0.1 10*3/uL (ref 0.0–0.1)
Basophils Relative: 1 %
Eosinophils Absolute: 0.1 10*3/uL (ref 0.0–0.5)
Eosinophils Relative: 1 %
HCT: 48.8 % (ref 39.0–52.0)
Hemoglobin: 16.5 g/dL (ref 13.0–17.0)
Immature Granulocytes: 0 %
Lymphocytes Relative: 8 %
Lymphs Abs: 0.8 10*3/uL (ref 0.7–4.0)
MCH: 30.3 pg (ref 26.0–34.0)
MCHC: 33.8 g/dL (ref 30.0–36.0)
MCV: 89.7 fL (ref 80.0–100.0)
Monocytes Absolute: 0.3 10*3/uL (ref 0.1–1.0)
Monocytes Relative: 3 %
Neutro Abs: 9.3 10*3/uL — ABNORMAL HIGH (ref 1.7–7.7)
Neutrophils Relative %: 87 %
Platelets: 264 10*3/uL (ref 150–400)
RBC: 5.44 MIL/uL (ref 4.22–5.81)
RDW: 13.8 % (ref 11.5–15.5)
WBC: 10.5 10*3/uL (ref 4.0–10.5)
nRBC: 0 % (ref 0.0–0.2)

## 2019-04-04 LAB — BASIC METABOLIC PANEL
Anion gap: 15 (ref 5–15)
BUN: 9 mg/dL (ref 6–20)
CO2: 18 mmol/L — ABNORMAL LOW (ref 22–32)
Calcium: 9.5 mg/dL (ref 8.9–10.3)
Chloride: 108 mmol/L (ref 98–111)
Creatinine, Ser: 1.03 mg/dL (ref 0.61–1.24)
GFR calc Af Amer: >60 mL/min (ref >60)
GFR calc non Af Amer: >60 mL/min (ref >60)
Glucose, Bld: 104 mg/dL — ABNORMAL HIGH (ref 70–99)
Potassium: 4.4 mmol/L (ref 3.5–5.1)
Sodium: 141 mmol/L (ref 135–145)

## 2019-04-04 MED ORDER — ALBUTEROL SULFATE (2.5 MG/3ML) 0.083% IN NEBU: 2.5000 mg | INHALATION_SOLUTION | Freq: Once | RESPIRATORY_TRACT | Status: DC

## 2019-04-04 MED ORDER — ALBUTEROL SULFATE HFA 108 (90 BASE) MCG/ACT IN AERS: 2.0000 | INHALATION_SPRAY | Freq: Four times a day (QID) | RESPIRATORY_TRACT | 0 refills | Status: DC | PRN

## 2019-04-04 MED ORDER — KETOROLAC TROMETHAMINE 30 MG/ML IJ SOLN
30.0000 mg | Freq: Once | INTRAMUSCULAR | Status: AC
  Administered 2019-04-04: 30 mg via INTRAVENOUS
  Filled 2019-04-04: qty 1

## 2019-04-04 MED ORDER — IPRATROPIUM-ALBUTEROL 0.5-2.5 (3) MG/3ML IN SOLN
3.0000 mL | Freq: Once | RESPIRATORY_TRACT | Status: AC
  Administered 2019-04-04: 3 mL via RESPIRATORY_TRACT
  Filled 2019-04-04: qty 3

## 2019-04-04 MED ORDER — LEVETIRACETAM IN NACL 1500 MG/100ML IV SOLN
1500.0000 mg | Freq: Once | INTRAVENOUS | Status: AC
  Administered 2019-04-04: 1500 mg via INTRAVENOUS
  Filled 2019-04-04: qty 100

## 2019-04-04 MED ORDER — PREDNISONE 20 MG PO TABS: 60.0000 mg | ORAL_TABLET | Freq: Every day | ORAL | 0 refills | Status: AC

## 2019-04-04 MED ORDER — METHYLPREDNISOLONE SODIUM SUCC 125 MG IJ SOLR
125.0000 mg | Freq: Once | INTRAMUSCULAR | Status: AC
  Administered 2019-04-04: 125 mg via INTRAVENOUS
  Filled 2019-04-04: qty 2

## 2019-04-04 NOTE — Discharge Instructions (Addendum)
It is very important that you resume taking your Keppra today.  You should also take the prednisone that was prescribed for your asthma, starting tomorrow.  Use the albuterol up to every 4-6 hours as needed for shortness of breath.  Return to the ER immediately for new or recurrent seizures, worsening shortness of breath, or any other new or worsening symptoms that concern you.

## 2019-04-04 NOTE — ED Provider Notes (Signed)
[facility] Emergency Department Provider Note ____________________________________________   First MD Initiated Contact with Patient 04/04/19 0235     (approximate)  I have reviewed the triage vital signs and the nursing notes.   HISTORY  Chief Complaint Seizures  Level 5 caveat: History of present illness limited due to postictal state  HPI Shane Leach is a 33 y.o. male with a history of seizure disorder and asthma who presents with recurrent seizures.  The wife reports that he had a seizure while at home, and then a second while being triaged.  She states that the seizures are generalized, and the patient becomes stiff.  She reports that he has had wheezing and some shortness of breath over the last week which has not been relieved by his albuterol.  He was feeling short of breath and going to take a breathing treatment when the seizure happened tonight.  He has been out of his Keppra for some time, and was in the ER a few days ago for a seizure as well.  The wife reports that they just filled the prescription tonight but he had not yet taken it.   Past Medical History:  Diagnosis Date  . Asthma   . Bronchitis   . Epilepsy ([place])   . Seizures [facility])     Patient Active Problem List   Diagnosis Date Noted  . Asthma exacerbation 11/17/2016    Past Surgical History:  Procedure Laterality Date  . testicular cancer     right testicle removed    Prior to Admission medications   Medication Sig Start Date End Date Taking? Authorizing Provider  albuterol (VENTOLIN HFA) 108 (90 Base) MCG/ACT inhaler Inhale 2-4 puffs by mouth every 4 hours as needed for wheezing, cough, and/or shortness of breath 01/11/19   Alfred Levins, [place], MD  albuterol (VENTOLIN HFA) 108 (90 Base) MCG/ACT inhaler Inhale 2 puffs into the lungs every 6 (six) hours as needed for wheezing or shortness of breath. 04/04/19   Arta Silence, MD  cyclobenzaprine (FLEXERIL) 5 MG tablet  Take 1-2 tablets 3 times daily as needed 04/06/18   Laban Emperor, PA-C  ipratropium-albuterol (DUONEB) 0.5-2.5 (3) MG/3ML SOLN Take 3 mLs by nebulization every 6 (six) hours as needed. 02/23/19   Sable Feil, PA-C  levETIRAcetam (KEPPRA) 1000 MG tablet Take 1 tablet (1,000 mg total) by mouth 3 (three) times daily. 04/02/19 10/29/19  Gregor Hams, MD  predniSONE (DELTASONE) 20 MG tablet Take 3 tablets (60 mg total) by mouth daily for 4 days. Start the day after the ER visit (Monday) 04/05/19 04/09/19  Arta Silence, MD    Allergies Dilantin [phenytoin sodium extended], Penicillins, and Tegretol [carbamazepine]  No family history on file.  Social History Social History   Tobacco Use  . Smoking status: Former Smoker    Packs/day: 0.50    Types: Cigarettes    Quit date: 10/12/2014    Years since quitting: 4.4  . Smokeless tobacco: Never Used  Substance Use Topics  . Alcohol use: Not Currently  . Drug use: Not Currently    Types: Marijuana    Review of Systems Level 5 caveat: Unable to obtain review of systems due to mental status/postictal state    ____________________________________________   PHYSICAL EXAM:  VITAL SIGNS: ED Triage Vitals  Enc Vitals Group     BP 04/04/19 0216 (!) 138/115     Pulse Rate 04/04/19 0216 83     Resp 04/04/19 0216 20  Temp 04/04/19 0232 97.7 F (36.5 C)     Temp Source 04/04/19 0232 Oral     SpO2 04/04/19 0216 96 %     Weight 04/04/19 0217 215 lb (97.5 kg)     Height 04/04/19 0217 6\' 1"  (1.854 m)     Head Circumference --      Peak Flow --      Pain Score --      Pain Loc --      Pain Edu? --      Excl. in [place]? --     Constitutional: Somnolent, slightly arousable.  Oriented to person only. Eyes: Conjunctivae are normal.  EOMI.  PERRLA. Head: Atraumatic. Nose: No congestion/rhinnorhea. Mouth/Throat: Mucous membranes are moist.   Neck: Normal range of motion.  Cardiovascular: Normal rate, regular rhythm. Grossly  normal heart sounds.  Good peripheral circulation. Respiratory: Normal respiratory effort.  No retractions.  Diffuse wheezing bilaterally. Gastrointestinal: No distention.  Musculoskeletal: No lower extremity edema.  Extremities warm and well perfused.  Neurologic: Postictal state.  Motor intact in all extremities. Skin:  Skin is warm and dry. No rash noted. Psychiatric: Unable to assess.  ____________________________________________   LABS (all labs ordered are listed, but only abnormal results are displayed)  Labs Reviewed  BASIC METABOLIC PANEL - Abnormal; Notable for the following components:      Result Value   CO2 18 (*)    Glucose, Bld 104 (*)    All other components within normal limits  CBC WITH DIFFERENTIAL/PLATELET - Abnormal; Notable for the following components:   Neutro Abs 9.3 (*)    All other components within normal limits  LEVETIRACETAM LEVEL   ____________________________________________  EKG    ____________________________________________  RADIOLOGY  CXR: No focal infiltrate or other acute abnormality  ____________________________________________   PROCEDURES  Procedure(s) performed: No  Procedures  Critical Care performed: No ____________________________________________   INITIAL IMPRESSION / ASSESSMENT AND PLAN / ED COURSE  Pertinent labs & imaging results that were available during my care of the patient were reviewed by me and considered in my medical decision making (see chart for details).  33 year old male with a history of seizure disorder and asthma presents with recurrent seizures, as well as wheezing over the last week.  The wife reports one seizure at home this evening and another while in triage.  I reviewed the past medical records in [place].  The patient was just seen in the ED on 04/02/2019 with a seizure.  At that time he was noted to have been out of his Keppra for some time.  He was loaded with IV Keppra in the ED and  discharged with a prescription.  The wife states that they just filled it tonight when they were able to pay for it, but he had not yet taken it when the seizure happened.  On exam, the patient appears postictal.  He is somnolent but arousable.  His vital signs are normal except for slight hypertension.  O2 saturation is in the mid 90s on room air.  He does have diffuse wheezing bilaterally.  Overall presentation is consistent with seizure related to the patient's known seizure disorder, and he is likely having an asthma exacerbation as well.  Since the asthma has not been relieved by albuterol at home, I will give Solu-Medrol and some DuoNeb's here.  We will obtain a chest x-ray to rule out pneumonia or other causes of the wheezing.  I will once again load the patient with IV Keppra and  we will observe in the ED for several hours.  If he returns to his baseline mental status, he may be stable for discharge home.  ----------------------------------------- 6:19 AM on 04/04/2019 -----------------------------------------  Lab work-up and chest x-ray are unremarkable.  The patient is now alert and oriented, and has returned to his baseline mental status.  He feels comfortable and would like to go home.  Given that the patient has not had further recurrent seizures and is back to his baseline, as well as the fact that he now does have Keppra at home which he can start taking today, he is stable for discharge at this time.  I emphasized the importance of resuming his Keppra today and following up with his doctor.  I have also prescribed albuterol and prednisone.  Return precautions given, and he and his wife expressed understanding. ____________________________________________   FINAL CLINICAL IMPRESSION(S) / ED DIAGNOSES  Final diagnoses:  Seizure disorder ([place])  Exacerbation of asthma, unspecified asthma severity, unspecified whether persistent      NEW MEDICATIONS STARTED DURING THIS VISIT:   New Prescriptions   ALBUTEROL (VENTOLIN HFA) 108 (90 BASE) MCG/ACT INHALER    Inhale 2 puffs into the lungs every 6 (six) hours as needed for wheezing or shortness of breath.   PREDNISONE (DELTASONE) 20 MG TABLET    Take 3 tablets (60 mg total) by mouth daily for 4 days. Start the day after the ER visit (Monday)     Note:  This document was prepared using Dragon voice recognition software and may include unintentional dictation errors.    Arta Silence, MD 04/04/19 [phone]

## 2019-04-04 NOTE — ED Notes (Signed)
Dr Cherylann Banas at bedside, speaking with wife

## 2019-04-04 NOTE — ED Notes (Addendum)
Pt postictal at this time, pt A&O to person only. Wife reports pt had a seizure in the waiting room before being brought back to the room. Seizure pads placed

## 2019-04-04 NOTE — ED Triage Notes (Signed)
Wife reports that patient had seizure tonight and that he has been wheezing for the past week.  Patient appears postical

## 2019-04-04 NOTE — ED Notes (Signed)
Resting quietly with eyes closed.

## 2019-04-06 LAB — LEVETIRACETAM LEVEL: Levetiracetam Lvl: <1 ug/mL — ABNORMAL LOW (ref 10.0–40.0)

## 2019-06-08 ENCOUNTER — Other Ambulatory Visit: Payer: Self-pay

## 2019-06-08 ENCOUNTER — Emergency Department: Payer: Self-pay

## 2019-06-08 ENCOUNTER — Emergency Department
Admission: EM | Admit: 2019-06-08 | Discharge: 2019-06-08 | Disposition: A | Discharge status: Home / Self Care | Payer: Self-pay | Attending: Emergency Medicine | Admitting: Emergency Medicine

## 2019-06-08 ENCOUNTER — Encounter: Payer: Self-pay | Admitting: Emergency Medicine

## 2019-06-08 DIAGNOSIS — Z79899 Other long term (current) drug therapy: Secondary | ICD-10-CM | POA: Insufficient documentation

## 2019-06-08 DIAGNOSIS — J45901 Unspecified asthma with (acute) exacerbation: Secondary | ICD-10-CM

## 2019-06-08 DIAGNOSIS — Z87891 Personal history of nicotine dependence: Secondary | ICD-10-CM | POA: Insufficient documentation

## 2019-06-08 LAB — COMPREHENSIVE METABOLIC PANEL
ALT: 40 U/L (ref 0–44)
AST: 29 U/L (ref 15–41)
Albumin: 4.9 g/dL (ref 3.5–5.0)
Alkaline Phosphatase: 60 U/L (ref 38–126)
Anion gap: 10 (ref 5–15)
BUN: 19 mg/dL (ref 6–20)
CO2: 22 mmol/L (ref 22–32)
Calcium: 9.6 mg/dL (ref 8.9–10.3)
Chloride: 109 mmol/L (ref 98–111)
Creatinine, Ser: 1.28 mg/dL — ABNORMAL HIGH (ref 0.61–1.24)
GFR calc Af Amer: >60 mL/min (ref >60)
GFR calc non Af Amer: >60 mL/min (ref >60)
Glucose, Bld: 105 mg/dL — ABNORMAL HIGH (ref 70–99)
Potassium: 4.3 mmol/L (ref 3.5–5.1)
Sodium: 141 mmol/L (ref 135–145)
Total Bilirubin: 0.8 mg/dL (ref 0.3–1.2)
Total Protein: 7.7 g/dL (ref 6.5–8.1)

## 2019-06-08 LAB — CBC WITH DIFFERENTIAL/PLATELET
Abs Immature Granulocytes: 0.05 10*3/uL (ref 0.00–0.07)
Basophils Absolute: 0.1 10*3/uL (ref 0.0–0.1)
Basophils Relative: 1 %
Eosinophils Absolute: 0.6 10*3/uL — ABNORMAL HIGH (ref 0.0–0.5)
Eosinophils Relative: 5 %
HCT: 50.1 % (ref 39.0–52.0)
Hemoglobin: 17 g/dL (ref 13.0–17.0)
Immature Granulocytes: 1 %
Lymphocytes Relative: 14 %
Lymphs Abs: 1.5 10*3/uL (ref 0.7–4.0)
MCH: 30.9 pg (ref 26.0–34.0)
MCHC: 33.9 g/dL (ref 30.0–36.0)
MCV: 91.1 fL (ref 80.0–100.0)
Monocytes Absolute: 0.7 10*3/uL (ref 0.1–1.0)
Monocytes Relative: 6 %
Neutro Abs: 8.1 10*3/uL — ABNORMAL HIGH (ref 1.7–7.7)
Neutrophils Relative %: 73 %
Platelets: 286 10*3/uL (ref 150–400)
RBC: 5.5 MIL/uL (ref 4.22–5.81)
RDW: 13.5 % (ref 11.5–15.5)
WBC: 11.1 10*3/uL — ABNORMAL HIGH (ref 4.0–10.5)
nRBC: 0 % (ref 0.0–0.2)

## 2019-06-08 LAB — TROPONIN I (HIGH SENSITIVITY): Troponin I (High Sensitivity): 5 ng/L (ref <18)

## 2019-06-08 MED ORDER — DIPHENHYDRAMINE HCL 25 MG PO CAPS
ORAL_CAPSULE | ORAL | Status: AC
  Filled 2019-06-08: qty 1

## 2019-06-08 MED ORDER — ALBUTEROL SULFATE (2.5 MG/3ML) 0.083% IN NEBU: 2.5000 mg | INHALATION_SOLUTION | Freq: Four times a day (QID) | RESPIRATORY_TRACT | 12 refills | Status: DC | PRN

## 2019-06-08 MED ORDER — DIPHENHYDRAMINE HCL 25 MG PO CAPS
25.0000 mg | ORAL_CAPSULE | Freq: Once | ORAL | Status: AC
  Administered 2019-06-08: 25 mg via ORAL

## 2019-06-08 MED ORDER — IPRATROPIUM-ALBUTEROL 0.5-2.5 (3) MG/3ML IN SOLN
3.0000 mL | Freq: Once | RESPIRATORY_TRACT | Status: AC
  Administered 2019-06-08: 3 mL via RESPIRATORY_TRACT
  Filled 2019-06-08: qty 3

## 2019-06-08 MED ORDER — PREDNISONE 50 MG PO TABS: 50.0000 mg | ORAL_TABLET | Freq: Every day | ORAL | 1 refills | Status: DC

## 2019-06-08 MED ORDER — DEXAMETHASONE SODIUM PHOSPHATE 10 MG/ML IJ SOLN
10.0000 mg | Freq: Once | INTRAMUSCULAR | Status: AC
  Administered 2019-06-08: 10 mg via INTRAVENOUS
  Filled 2019-06-08: qty 1

## 2019-06-08 NOTE — ED Notes (Signed)
Pt to xray

## 2019-06-08 NOTE — ED Triage Notes (Addendum)
Patient ambulatory to triage with steady gait, without difficulty or distress noted , mask in place; pt reports SHOB and prod cough white sputum since last Thursday; c/o mid upper back pain; st hx asthma

## 2019-06-08 NOTE — ED Provider Notes (Signed)
[facility] Emergency Department Provider Note   ____________________________________________    I have reviewed the triage vital signs and the nursing notes.   HISTORY  Chief Complaint Shortness of Breath     HPI Shane Leach is a 33 y.o. male with a history of asthma who presents today with complaints of shortness of breath.  Patient reports he had been out of his inhaler for some time although he did get refill yesterday, he has used his inhaler multiple times since then with no significant provement in his mild shortness of breath and chest tightness.  No fevers or chills.  Occasional cough.  Feels consistent with past asthma exacerbations.  Past Medical History:  Diagnosis Date  . Asthma   . Bronchitis   . Epilepsy ([place])   . Seizures [facility])     Patient Active Problem List   Diagnosis Date Noted  . Asthma exacerbation 11/17/2016    Past Surgical History:  Procedure Laterality Date  . testicular cancer     right testicle removed    Prior to Admission medications   Medication Sig Start Date End Date Taking? Authorizing Provider  albuterol (PROVENTIL) (2.5 MG/3ML) 0.083% nebulizer solution Take 3 mLs (2.5 mg total) by nebulization every 6 (six) hours as needed for wheezing or shortness of breath. 06/08/19   Lavonia Drafts, MD  albuterol (VENTOLIN HFA) 108 (90 Base) MCG/ACT inhaler Inhale 2-4 puffs by mouth every 4 hours as needed for wheezing, cough, and/or shortness of breath 01/11/19   Alfred Levins, [place], MD  albuterol (VENTOLIN HFA) 108 (90 Base) MCG/ACT inhaler Inhale 2 puffs into the lungs every 6 (six) hours as needed for wheezing or shortness of breath. 04/04/19   Arta Silence, MD  cyclobenzaprine (FLEXERIL) 5 MG tablet Take 1-2 tablets 3 times daily as needed 04/06/18   Laban Emperor, PA-C  ipratropium-albuterol (DUONEB) 0.5-2.5 (3) MG/3ML SOLN Take 3 mLs by nebulization every 6 (six) hours as needed. 02/23/19   Sable Feil, PA-C  levETIRAcetam (KEPPRA) 1000 MG tablet Take 1 tablet (1,000 mg total) by mouth 3 (three) times daily. 04/02/19 10/29/19  Gregor Hams, MD  predniSONE (DELTASONE) 50 MG tablet Take 1 tablet (50 mg total) by mouth daily with breakfast. 06/08/19   Lavonia Drafts, MD     Allergies Dilantin [phenytoin sodium extended], Penicillins, and Tegretol [carbamazepine]  No family history on file.  Social History Social History   Tobacco Use  . Smoking status: Former Smoker    Packs/day: 0.50    Types: Cigarettes    Quit date: 10/12/2014    Years since quitting: 4.6  . Smokeless tobacco: Never Used  Substance Use Topics  . Alcohol use: Not Currently  . Drug use: Not Currently    Types: Marijuana    Review of Systems  Constitutional: No fever/chills Eyes: No visual changes.  ENT: No sore throat. Cardiovascular: As above Respiratory: As above Gastrointestinal: No abdominal pain.  No nausea, no vomiting.   Genitourinary: Negative for dysuria. Musculoskeletal: Negative for back pain. Skin: Negative for rash. Neurological: Negative for headaches    ____________________________________________   PHYSICAL EXAM:  VITAL SIGNS: ED Triage Vitals  Enc Vitals Group     BP 06/08/19 0624 (!) 155/93     Pulse Rate 06/08/19 0624 87     Resp 06/08/19 0624 19     Temp 06/08/19 0624 98.6 F (37 C)     Temp Source 06/08/19 0624 Oral     SpO2 06/08/19  0624 92 %     Weight 06/08/19 0622 103.4 kg (228 lb)     Height 06/08/19 0622 1.829 m (6')     Head Circumference --      Peak Flow --      Pain Score 06/08/19 0622 7     Pain Loc --      Pain Edu? --      Excl. in [place]? --     Constitutional: Alert and oriented.   Nose: No congestion/rhinnorhea. Mouth/Throat: Mucous membranes are moist.    Cardiovascular: Normal rate, regular rhythm. Grossly normal heart sounds.  Good peripheral circulation. Respiratory: Normal respiratory effort.  No retractions.  Moderate wheezing  throughout Genitourinary: deferred Musculoskeletal: No lower extremity tenderness nor edema.  Warm and well perfused Neurologic:  Normal speech and language. No gross focal neurologic deficits are appreciated.  Skin:  Skin is warm, dry and intact. No rash noted. Psychiatric: Mood and affect are normal. Speech and behavior are normal.  ____________________________________________   LABS (all labs ordered are listed, but only abnormal results are displayed)  Labs Reviewed  CBC WITH DIFFERENTIAL/PLATELET - Abnormal; Notable for the following components:      Result Value   WBC 11.1 (*)    Neutro Abs 8.1 (*)    Eosinophils Absolute 0.6 (*)    All other components within normal limits  COMPREHENSIVE METABOLIC PANEL - Abnormal; Notable for the following components:   Glucose, Bld 105 (*)    Creatinine, Ser 1.28 (*)    All other components within normal limits  TROPONIN I (HIGH SENSITIVITY)   ____________________________________________  EKG  ED ECG REPORT I, Lavonia Drafts, the attending physician, personally viewed and interpreted this ECG.  Date: 06/08/2019  Rhythm: normal sinus rhythm QRS Axis: normal Intervals: normal ST/T Wave abnormalities: normal Narrative Interpretation: no evidence of acute ischemia  ____________________________________________  RADIOLOGY  Chest x-ray unremarkable ____________________________________________   PROCEDURES  Procedure(s) performed: No  Procedures   Critical Care performed: No ____________________________________________   INITIAL IMPRESSION / ASSESSMENT AND PLAN / ED COURSE  Pertinent labs & imaging results that were available during my care of the patient were reviewed by me and considered in my medical decision making (see chart for details).  Patient presents with symptoms consistent with mild asthma exacerbation.  We will treat with IM Decadron, duo nebs.  Patient significantly improved  We will refill albuterol  nebulizer solution, prescribe prednisone as needed for future exacerbations    ____________________________________________   FINAL CLINICAL IMPRESSION(S) / ED DIAGNOSES  Final diagnoses:  Exacerbation of asthma, unspecified asthma severity, unspecified whether persistent        Note:  This document was prepared using Dragon voice recognition software and may include unintentional dictation errors.   Lavonia Drafts, MD 06/08/19 [phone]

## 2019-08-31 ENCOUNTER — Encounter: Payer: Self-pay | Admitting: Emergency Medicine

## 2019-08-31 ENCOUNTER — Other Ambulatory Visit: Payer: Self-pay

## 2019-08-31 ENCOUNTER — Emergency Department
Admission: EM | Admit: 2019-08-31 | Discharge: 2019-08-31 | Disposition: A | Discharge status: Home / Self Care | Payer: Self-pay | Attending: Emergency Medicine | Admitting: Emergency Medicine

## 2019-08-31 DIAGNOSIS — R030 Elevated blood-pressure reading, without diagnosis of hypertension: Secondary | ICD-10-CM | POA: Insufficient documentation

## 2019-08-31 DIAGNOSIS — F419 Anxiety disorder, unspecified: Secondary | ICD-10-CM | POA: Insufficient documentation

## 2019-08-31 DIAGNOSIS — J45909 Unspecified asthma, uncomplicated: Secondary | ICD-10-CM | POA: Insufficient documentation

## 2019-08-31 LAB — URINALYSIS, COMPLETE (UACMP) WITH MICROSCOPIC
Bacteria, UA: NONE SEEN
Bilirubin Urine: NEGATIVE
Glucose, UA: NEGATIVE mg/dL
Ketones, ur: 20 mg/dL — AB
Leukocytes,Ua: NEGATIVE
Nitrite: NEGATIVE
Protein, ur: 30 mg/dL — AB
Specific Gravity, Urine: 1.028 (ref 1.005–1.030)
pH: 6 (ref 5.0–8.0)

## 2019-08-31 LAB — CBC WITH DIFFERENTIAL/PLATELET
Abs Immature Granulocytes: 0.03 10*3/uL (ref 0.00–0.07)
Basophils Absolute: 0.1 10*3/uL (ref 0.0–0.1)
Basophils Relative: 1 %
Eosinophils Absolute: 0.5 10*3/uL (ref 0.0–0.5)
Eosinophils Relative: 4 %
HCT: 51.2 % (ref 39.0–52.0)
Hemoglobin: 16.8 g/dL (ref 13.0–17.0)
Immature Granulocytes: 0 %
Lymphocytes Relative: 17 %
Lymphs Abs: 1.8 10*3/uL (ref 0.7–4.0)
MCH: 30.1 pg (ref 26.0–34.0)
MCHC: 32.8 g/dL (ref 30.0–36.0)
MCV: 91.8 fL (ref 80.0–100.0)
Monocytes Absolute: 0.9 10*3/uL (ref 0.1–1.0)
Monocytes Relative: 9 %
Neutro Abs: 7.2 10*3/uL (ref 1.7–7.7)
Neutrophils Relative %: 69 %
Platelets: 276 10*3/uL (ref 150–400)
RBC: 5.58 MIL/uL (ref 4.22–5.81)
RDW: 13.4 % (ref 11.5–15.5)
WBC: 10.5 10*3/uL (ref 4.0–10.5)
nRBC: 0 % (ref 0.0–0.2)

## 2019-08-31 LAB — COMPREHENSIVE METABOLIC PANEL
ALT: 41 U/L (ref 0–44)
AST: 37 U/L (ref 15–41)
Albumin: 4.5 g/dL (ref 3.5–5.0)
Alkaline Phosphatase: 62 U/L (ref 38–126)
Anion gap: 13 (ref 5–15)
BUN: 10 mg/dL (ref 6–20)
CO2: 23 mmol/L (ref 22–32)
Calcium: 9.5 mg/dL (ref 8.9–10.3)
Chloride: 103 mmol/L (ref 98–111)
Creatinine, Ser: 1.15 mg/dL (ref 0.61–1.24)
GFR calc Af Amer: >60 mL/min (ref >60)
GFR calc non Af Amer: >60 mL/min (ref >60)
Glucose, Bld: 96 mg/dL (ref 70–99)
Potassium: 4 mmol/L (ref 3.5–5.1)
Sodium: 139 mmol/L (ref 135–145)
Total Bilirubin: 1.3 mg/dL — ABNORMAL HIGH (ref 0.3–1.2)
Total Protein: 7.5 g/dL (ref 6.5–8.1)

## 2019-08-31 LAB — URINE DRUG SCREEN, QUALITATIVE (ARMC ONLY)
Amphetamines, Ur Screen: NOT DETECTED
Barbiturates, Ur Screen: NOT DETECTED
Benzodiazepine, Ur Scrn: NOT DETECTED
Cannabinoid 50 Ng, Ur ~~LOC~~: POSITIVE — AB
Cocaine Metabolite,Ur ~~LOC~~: NOT DETECTED
MDMA (Ecstasy)Ur Screen: NOT DETECTED
Methadone Scn, Ur: NOT DETECTED
Opiate, Ur Screen: NOT DETECTED
Phencyclidine (PCP) Ur S: NOT DETECTED
Tricyclic, Ur Screen: NOT DETECTED

## 2019-08-31 LAB — TROPONIN I (HIGH SENSITIVITY): Troponin I (High Sensitivity): 5 ng/L (ref <18)

## 2019-08-31 MED ORDER — LORAZEPAM 0.5 MG PO TABS: 0.5000 mg | ORAL_TABLET | Freq: Two times a day (BID) | ORAL | 0 refills | Status: DC | PRN

## 2019-08-31 MED ORDER — ALBUTEROL SULFATE HFA 108 (90 BASE) MCG/ACT IN AERS: 2.0000 | INHALATION_SPRAY | Freq: Four times a day (QID) | RESPIRATORY_TRACT | 0 refills | Status: DC | PRN

## 2019-08-31 MED ORDER — ALBUTEROL SULFATE (2.5 MG/3ML) 0.083% IN NEBU: 2.5000 mg | INHALATION_SOLUTION | Freq: Four times a day (QID) | RESPIRATORY_TRACT | 12 refills | Status: DC | PRN

## 2019-08-31 NOTE — ED Notes (Signed)
Pt signed paper discharge form.  No computer available at this time. Placed in med rec box.

## 2019-08-31 NOTE — ED Provider Notes (Signed)
[facility] Emergency Department Provider Note   ____________________________________________    I have reviewed the triage vital signs and the nursing notes.   HISTORY  Chief Complaint Anxiety     HPI Shane Leach is a 34 y.o. male who presents with complaints of anxiety.  Patient reports of the last 2 days he has had significant anxiety manifested by feeling "jittery and having difficulty sleeping.  He states that he "just feels stressed ".  Denies any thoughts of self-harm does not have a significant history of anxiety but has been under more stress recently than usual.  Requesting thing to help him sleep and relax   Past Medical History:  Diagnosis Date  . Asthma   . Bronchitis   . Epilepsy ([place])   . Seizures [facility])     Patient Active Problem List   Diagnosis Date Noted  . Asthma exacerbation 11/17/2016    Past Surgical History:  Procedure Laterality Date  . testicular cancer     right testicle removed    Prior to Admission medications   Medication Sig Start Date End Date Taking? Authorizing Provider  albuterol (PROVENTIL) (2.5 MG/3ML) 0.083% nebulizer solution Take 3 mLs (2.5 mg total) by nebulization every 6 (six) hours as needed for wheezing or shortness of breath. 08/31/19   Lavonia Drafts, MD  albuterol (VENTOLIN HFA) 108 (90 Base) MCG/ACT inhaler Inhale 2-4 puffs by mouth every 4 hours as needed for wheezing, cough, and/or shortness of breath 01/11/19   Alfred Levins, [place], MD  albuterol (VENTOLIN HFA) 108 (90 Base) MCG/ACT inhaler Inhale 2 puffs into the lungs every 6 (six) hours as needed for wheezing or shortness of breath. 08/31/19   Lavonia Drafts, MD  cyclobenzaprine (FLEXERIL) 5 MG tablet Take 1-2 tablets 3 times daily as needed 04/06/18   Laban Emperor, PA-C  ipratropium-albuterol (DUONEB) 0.5-2.5 (3) MG/3ML SOLN Take 3 mLs by nebulization every 6 (six) hours as needed. 02/23/19   Sable Feil, PA-C  levETIRAcetam  (KEPPRA) 1000 MG tablet Take 1 tablet (1,000 mg total) by mouth 3 (three) times daily. 04/02/19 10/29/19  Gregor Hams, MD  LORazepam (ATIVAN) 0.5 MG tablet Take 1 tablet (0.5 mg total) by mouth 2 (two) times daily as needed for anxiety. 08/31/19 08/30/20  Lavonia Drafts, MD  predniSONE (DELTASONE) 50 MG tablet Take 1 tablet (50 mg total) by mouth daily with breakfast. 06/08/19   Lavonia Drafts, MD     Allergies Dilantin [phenytoin sodium extended], Penicillins, and Tegretol [carbamazepine]  No family history on file.  Social History Social History   Tobacco Use  . Smoking status: Former Smoker    Packs/day: 0.50    Types: Cigarettes    Quit date: 10/12/2014    Years since quitting: 4.8  . Smokeless tobacco: Never Used  Substance Use Topics  . Alcohol use: Not Currently  . Drug use: Not Currently    Types: Marijuana    Review of Systems  Constitutional: No fever/chills   Cardiovascular: No chest pain  Gastrointestinal: No abdominal pain.  No nausea, no vomiting.    Musculoskeletal: Negative for back pain.     ____________________________________________   PHYSICAL EXAM:  VITAL SIGNS: ED Triage Vitals  Enc Vitals Group     BP 08/31/19 0417 (!) 158/108     Pulse Rate 08/31/19 0417 77     Resp 08/31/19 0417 18     Temp 08/31/19 0417 97.8 F (36.6 C)     Temp Source 08/31/19 0417  Oral     SpO2 08/31/19 0417 99 %     Weight 08/31/19 0416 104.3 kg (230 lb)     Height 08/31/19 0416 1.829 m (6')     Head Circumference --      Peak Flow --      Pain Score 08/31/19 0416 0     Pain Loc --      Pain Edu? --      Excl. in [place]? --      Constitutional: Alert and oriented. No acute distress. Pleasant and interactive Eyes: Conjunctivae are normal.  Head: Atraumatic. Nose: No congestion/rhinnorhea. Mouth/Throat: Mucous membranes are moist.   Cardiovascular: Normal rate, regular rhythm.  No murmurs noted Respiratory: Normal respiratory effort.  No retractions.   Musculoskeletal: No lower extremity tenderness nor edema.   Neurologic:  Normal speech and language. No gross focal neurologic deficits are appreciated.   Skin:  Skin is warm, dry and intact. No rash noted.   ____________________________________________   LABS (all labs ordered are listed, but only abnormal results are displayed)  Labs Reviewed  COMPREHENSIVE METABOLIC PANEL - Abnormal; Notable for the following components:      Result Value   Total Bilirubin 1.3 (*)    All other components within normal limits  URINALYSIS, COMPLETE (UACMP) WITH MICROSCOPIC - Abnormal; Notable for the following components:   Color, Urine AMBER (*)    APPearance CLEAR (*)    Hgb urine dipstick MODERATE (*)    Ketones, ur 20 (*)    Protein, ur 30 (*)    All other components within normal limits  URINE DRUG SCREEN, QUALITATIVE ([facility] ONLY) - Abnormal; Notable for the following components:   Cannabinoid 50 Ng, Ur [place] POSITIVE (*)    All other components within normal limits  CBC WITH DIFFERENTIAL/PLATELET  TROPONIN I (HIGH SENSITIVITY)   ____________________________________________  EKG  ED ECG REPORT I, Lavonia Drafts, the attending physician, personally viewed and interpreted this ECG.  Date: 08/31/2019  Rhythm: normal sinus rhythm QRS Axis: normal Intervals: normal ST/T Wave abnormalities: normal Narrative Interpretation: no evidence of acute ischemia  ____________________________________________  RADIOLOGY  None ____________________________________________   PROCEDURES  Procedure(s) performed: No  Procedures   Critical Care performed: No ____________________________________________   INITIAL IMPRESSION / ASSESSMENT AND PLAN / ED COURSE  Pertinent labs & imaging results that were available during my care of the patient were reviewed by me and considered in my medical decision making (see chart for details).  Patient presents with complaints of anxiety, difficulty  sleeping.  He is overall well-appearing at this time and was actually sleeping when I entered the room.  Lab work is normal EKG is normal, troponin is normal.  Will prescribe short course of benzodiazepine, have strongly encouraged outpatient follow-up.   ____________________________________________   FINAL CLINICAL IMPRESSION(S) / ED DIAGNOSES  Final diagnoses:  Anxiety      NEW MEDICATIONS STARTED DURING THIS VISIT:  Discharge Medication List as of 08/31/2019  7:14 AM    START taking these medications   Details  LORazepam (ATIVAN) 0.5 MG tablet Take 1 tablet (0.5 mg total) by mouth 2 (two) times daily as needed for anxiety., Starting Tue 08/31/2019, Until Wed 08/30/2020, Normal         Note:  This document was prepared using Dragon voice recognition software and may include unintentional dictation errors.   Lavonia Drafts, MD 08/31/19 [phone]

## 2019-08-31 NOTE — ED Notes (Signed)
Pt c/o increased anxiety. Denies SI/HI.  Would like a refill of his albuterol neb.  NAD. No pain at this time.

## 2019-08-31 NOTE — ED Triage Notes (Signed)
Patient ambulatory to triage with steady gait, without difficulty or distress noted; pt st "I feel out of my mind and I'm having a panic attack"; st he hasn't slept in 2 nights; st heart racing last night, feels "nervous and scared"; hx seizures, st taking dilantin as rx

## 2019-11-09 ENCOUNTER — Ambulatory Visit: Payer: Self-pay

## 2019-12-07 ENCOUNTER — Emergency Department
Admission: EM | Admit: 2019-12-07 | Discharge: 2019-12-07 | Disposition: A | Discharge status: Home / Self Care | Payer: Self-pay | Attending: Emergency Medicine | Admitting: Emergency Medicine

## 2019-12-07 ENCOUNTER — Other Ambulatory Visit: Payer: Self-pay

## 2019-12-07 ENCOUNTER — Encounter: Payer: Self-pay | Admitting: Emergency Medicine

## 2019-12-07 DIAGNOSIS — J4520 Mild intermittent asthma, uncomplicated: Secondary | ICD-10-CM

## 2019-12-07 DIAGNOSIS — J4521 Mild intermittent asthma with (acute) exacerbation: Secondary | ICD-10-CM | POA: Insufficient documentation

## 2019-12-07 DIAGNOSIS — Z87891 Personal history of nicotine dependence: Secondary | ICD-10-CM | POA: Insufficient documentation

## 2019-12-07 MED ORDER — IPRATROPIUM-ALBUTEROL 0.5-2.5 (3) MG/3ML IN SOLN
3.0000 mL | Freq: Once | RESPIRATORY_TRACT | Status: AC
  Administered 2019-12-07: 3 mL via RESPIRATORY_TRACT
  Filled 2019-12-07: qty 3

## 2019-12-07 MED ORDER — ALBUTEROL SULFATE (2.5 MG/3ML) 0.083% IN NEBU: 2.5000 mg | INHALATION_SOLUTION | Freq: Four times a day (QID) | RESPIRATORY_TRACT | 2 refills | Status: DC | PRN

## 2019-12-07 NOTE — ED Triage Notes (Signed)
Says he is out of asthma meds.  He is out of the nebulizer albuterol for 3 days.  Says no fever,  Says his asthma is acting up.

## 2019-12-07 NOTE — ED Notes (Addendum)
See triage note   Presents with some SOB and wheezing  States he ran out of asthma meds  Denies any other sxs'  No fever

## 2019-12-07 NOTE — ED Provider Notes (Signed)
[facility] Emergency Department Provider Note  ____________________________________________  Time seen: Approximately 12:22 PM  I have reviewed the triage vital signs and the nursing notes.   HISTORY  Chief Complaint Shortness of Breath    HPI Shane Leach is a 34 y.o. male that presents to the emergency department for nebulizer refill.  Patient has had asthma since he was a child.  He ran out of his nebulizer medication yesterday.  He usually uses his medication daily.  He does not have a primary care but just got approved for Medicaid.  No recent illness.  No fever, cough, shortness of breath.   Past Medical History:  Diagnosis Date  . Asthma   . Bronchitis   . Epilepsy ([name])   . Seizures [facility])     Patient Active Problem List   Diagnosis Date Noted  . Asthma exacerbation 11/17/2016    Past Surgical History:  Procedure Laterality Date  . testicular cancer     right testicle removed    Prior to Admission medications   Medication Sig Start Date End Date Taking? Authorizing Provider  albuterol (PROVENTIL) (2.5 MG/3ML) 0.083% nebulizer solution Take 3 mLs (2.5 mg total) by nebulization every 6 (six) hours as needed for wheezing or shortness of breath. 12/07/19   Laban Emperor, PA-C  ipratropium-albuterol (DUONEB) 0.5-2.5 (3) MG/3ML SOLN Take 3 mLs by nebulization every 6 (six) hours as needed. 02/23/19   Sable Feil, PA-C  levETIRAcetam (KEPPRA) 1000 MG tablet Take 1 tablet (1,000 mg total) by mouth 3 (three) times daily. 04/02/19 10/29/19  Gregor Hams, MD  LORazepam (ATIVAN) 0.5 MG tablet Take 1 tablet (0.5 mg total) by mouth 2 (two) times daily as needed for anxiety. 08/31/19 08/30/20  Lavonia Drafts, MD    Allergies Dilantin [phenytoin sodium extended], Penicillins, and Tegretol [carbamazepine]  No family history on file.  Social History Social History   Tobacco Use  . Smoking status: Former Smoker    Packs/day: 0.50     Types: Cigarettes    Quit date: 10/12/2014    Years since quitting: 5.1  . Smokeless tobacco: Never Used  Substance Use Topics  . Alcohol use: Not Currently  . Drug use: Not Currently    Types: Marijuana     Review of Systems  Constitutional: No fever/chills ENT: No upper respiratory complaints. Cardiovascular: No chest pain. Respiratory: No cough. No SOB. Gastrointestinal: No abdominal pain.  No nausea, no vomiting.  Musculoskeletal: Negative for musculoskeletal pain. Skin: Negative for rash, abrasions, lacerations, ecchymosis. Neurological: Negative for headaches   ____________________________________________   PHYSICAL EXAM:  VITAL SIGNS: ED Triage Vitals  Enc Vitals Group     BP 12/07/19 1111 (!) 155/114     Pulse Rate 12/07/19 1105 71     Resp 12/07/19 1105 18     Temp 12/07/19 1105 98.8 F (37.1 C)     Temp Source 12/07/19 1105 Oral     SpO2 12/07/19 1105 97 %     Weight 12/07/19 1105 238 lb (108 kg)     Height 12/07/19 1105 6' (1.829 m)     Head Circumference --      Peak Flow --      Pain Score 12/07/19 1109 6     Pain Loc --      Pain Edu? --      Excl. in [place]? --      Constitutional: Alert and oriented. Well appearing and in no acute distress. Eyes: Conjunctivae are normal.  PERRL. EOMI. Head: Atraumatic. ENT:      Ears:      Nose: No congestion/rhinnorhea.      Mouth/Throat: Mucous membranes are moist.  Neck: No stridor.  Cardiovascular: Normal rate, regular rhythm.  Good peripheral circulation. Respiratory: Normal respiratory effort without tachypnea or retractions.  Scattered wheezes to lung fields.  Good air entry to the bases with no decreased or absent breath sounds. Musculoskeletal: Full range of motion to all extremities. No gross deformities appreciated. Neurologic:  Normal speech and language. No gross focal neurologic deficits are appreciated.  Skin:  Skin is warm, dry and intact. No rash noted. Psychiatric: Mood and affect are normal.  Speech and behavior are normal. Patient exhibits appropriate insight and judgement.   ____________________________________________   LABS (all labs ordered are listed, but only abnormal results are displayed)  Labs Reviewed - No data to display ____________________________________________  EKG   ____________________________________________  RADIOLOGY   No results found.  ____________________________________________    PROCEDURES  Procedure(s) performed:    Procedures    Medications  ipratropium-albuterol (DUONEB) 0.5-2.5 (3) MG/3ML nebulizer solution 3 mL (3 mLs Nebulization Given 12/07/19 1209)     ____________________________________________   INITIAL IMPRESSION / ASSESSMENT AND PLAN / ED COURSE  Pertinent labs & imaging results that were available during my care of the patient were reviewed by me and considered in my medical decision making (see chart for details).  Review of the [place] CSRS was performed in accordance of the [place] prior to dispensing any controlled drugs.     Patient's diagnosis is consistent with asthma.  Vital signs and exam are reassuring.  Patient felt great following nebulizer and wheezing cleared following nebulizer.  Patient will be discharged home with prescriptions for albuterol nebulizer. Patient is to follow up with primary care as directed. Patient is given ED precautions to return to the ED for any worsening or new symptoms.   Shane Leach was evaluated in Emergency Department on 12/07/2019 for the symptoms described in the history of present illness. He was evaluated in the context of the global COVID-19 pandemic, which necessitated consideration that the patient might be at risk for infection with the SARS-CoV-2 virus that causes COVID-19. Institutional protocols and algorithms that pertain to the evaluation of patients at risk for COVID-19 are in a state of rapid change based on information released by regulatory bodies  including the CDC and federal and state organizations. These policies and algorithms were followed during the patient's care in the ED.  ____________________________________________  FINAL CLINICAL IMPRESSION(S) / ED DIAGNOSES  Final diagnoses:  Intermittent asthma without complication, unspecified asthma severity      NEW MEDICATIONS STARTED DURING THIS VISIT:  ED Discharge Orders         Ordered    albuterol (PROVENTIL) (2.5 MG/3ML) 0.083% nebulizer solution  Every 6 hours PRN     Discontinue  Reprint     12/07/19 1158              This chart was dictated using voice recognition software/Dragon. Despite best efforts to proofread, errors can occur which can change the meaning. Any change was purely unintentional.    Laban Emperor, PA-C 12/07/19 1324    Lavonia Drafts, MD 12/07/19 1339

## 2019-12-15 ENCOUNTER — Encounter: Payer: Self-pay | Admitting: *Deleted

## 2019-12-15 ENCOUNTER — Emergency Department: Payer: Medicaid Other

## 2019-12-15 ENCOUNTER — Emergency Department
Admission: EM | Admit: 2019-12-15 | Discharge: 2019-12-15 | Disposition: A | Discharge status: Home / Self Care | Payer: Medicaid Other | Attending: Emergency Medicine | Admitting: Emergency Medicine

## 2019-12-15 ENCOUNTER — Other Ambulatory Visit: Payer: Self-pay

## 2019-12-15 DIAGNOSIS — J4521 Mild intermittent asthma with (acute) exacerbation: Secondary | ICD-10-CM | POA: Insufficient documentation

## 2019-12-15 DIAGNOSIS — Z87891 Personal history of nicotine dependence: Secondary | ICD-10-CM | POA: Insufficient documentation

## 2019-12-15 MED ORDER — IPRATROPIUM-ALBUTEROL 0.5-2.5 (3) MG/3ML IN SOLN
3.0000 mL | Freq: Once | RESPIRATORY_TRACT | Status: AC
  Administered 2019-12-15: 3 mL via RESPIRATORY_TRACT
  Filled 2019-12-15: qty 3

## 2019-12-15 MED ORDER — DEXAMETHASONE SODIUM PHOSPHATE 10 MG/ML IJ SOLN
10.0000 mg | Freq: Once | INTRAMUSCULAR | Status: AC
  Administered 2019-12-15: 10 mg via INTRAMUSCULAR
  Filled 2019-12-15: qty 1

## 2019-12-15 MED ORDER — IPRATROPIUM-ALBUTEROL 0.5-2.5 (3) MG/3ML IN SOLN: 3.0000 mL | RESPIRATORY_TRACT | 5 refills | Status: DC | PRN

## 2019-12-15 MED ORDER — PREDNISONE 10 MG (21) PO TBPK: ORAL_TABLET | ORAL | 0 refills | Status: DC

## 2019-12-15 MED ORDER — ALBUTEROL SULFATE (2.5 MG/3ML) 0.083% IN NEBU
5.0000 mg | INHALATION_SOLUTION | Freq: Once | RESPIRATORY_TRACT | Status: AC
  Administered 2019-12-15: 5 mg via RESPIRATORY_TRACT
  Filled 2019-12-15: qty 6

## 2019-12-15 NOTE — ED Triage Notes (Signed)
Per patient report, patient reports he is out of his nebulizer treatments for asthma. Patient states he was given one box of treatments and has gone through that supply. Patient states he is fine on his inhaler. Patient states he doesn't need a blood draw, just the nebulizer prescription. Patient doesn't have a primary medical doctor.

## 2019-12-15 NOTE — ED Provider Notes (Signed)
[facility] Emergency Department Provider Note  ____________________________________________   First MD Initiated Contact with Patient 12/15/19 1307     (approximate)  I have reviewed the triage vital signs and the nursing notes.   HISTORY  Chief Complaint Shortness of Breath    HPI Shane Leach is a 34 y.o. male presents emergency department with an asthma exasperation.  States he has had difficulty breathing as he has run out of his Nebules for his nebulizer machine.  States sometimes in the summertime he needs a steroid pack.  No chest pain.  No fever or chills.    Past Medical History:  Diagnosis Date  . Asthma   . Bronchitis   . Epilepsy ([place])   . Seizures [facility])     Patient Active Problem List   Diagnosis Date Noted  . Asthma exacerbation 11/17/2016    Past Surgical History:  Procedure Laterality Date  . testicular cancer     right testicle removed    Prior to Admission medications   Medication Sig Start Date End Date Taking? Authorizing Provider  albuterol (PROVENTIL) (2.5 MG/3ML) 0.083% nebulizer solution Take 3 mLs (2.5 mg total) by nebulization every 6 (six) hours as needed for wheezing or shortness of breath. 12/07/19   Laban Emperor, PA-C  ipratropium-albuterol (DUONEB) 0.5-2.5 (3) MG/3ML SOLN Take 3 mLs by nebulization every 6 (six) hours as needed. 02/23/19   Sable Feil, PA-C  ipratropium-albuterol (DUONEB) 0.5-2.5 (3) MG/3ML SOLN Take 3 mLs by nebulization every 4 (four) hours as needed. 12/15/19   Kyrese Gartman, Linden Dolin, PA-C  levETIRAcetam (KEPPRA) 1000 MG tablet Take 1 tablet (1,000 mg total) by mouth 3 (three) times daily. 04/02/19 10/29/19  Gregor Hams, MD  LORazepam (ATIVAN) 0.5 MG tablet Take 1 tablet (0.5 mg total) by mouth 2 (two) times daily as needed for anxiety. 08/31/19 08/30/20  Lavonia Drafts, MD  predniSONE (STERAPRED UNI-PAK 21 TAB) 10 MG (21) TBPK tablet Take 6 pills on day one then decrease by 1 pill each  day 12/15/19   Versie Starks, PA-C    Allergies Dilantin [phenytoin sodium extended], Penicillins, and Tegretol [carbamazepine]  History reviewed. No pertinent family history.  Social History Social History   Tobacco Use  . Smoking status: Former Smoker    Packs/day: 0.50    Types: Cigarettes    Quit date: 10/12/2014    Years since quitting: 5.1  . Smokeless tobacco: Never Used  Substance Use Topics  . Alcohol use: Not Currently  . Drug use: Not Currently    Types: Marijuana    Review of Systems  Constitutional: No fever/chills Eyes: No visual changes. ENT: No sore throat. Respiratory: Denies cough, positive for difficulty breathing Cardiovascular: Denies chest pain Gastrointestinal: Denies abdominal pain Genitourinary: Negative for dysuria. Musculoskeletal: Negative for back pain. Skin: Negative for rash. Psychiatric: no mood changes,     ____________________________________________   PHYSICAL EXAM:  VITAL SIGNS: ED Triage Vitals  Enc Vitals Group     BP 12/15/19 1151 (!) 132/92     Pulse Rate 12/15/19 1151 74     Resp 12/15/19 1151 18     Temp 12/15/19 1237 98.1 F (36.7 C)     Temp Source 12/15/19 1151 Oral     SpO2 12/15/19 1151 99 %     Weight 12/15/19 1152 235 lb (106.6 kg)     Height 12/15/19 1152 6' (1.829 m)     Head Circumference --      Peak Flow --  Pain Score 12/15/19 1152 0     Pain Loc --      Pain Edu? --      Excl. in [place]? --     Constitutional: Alert and oriented. Well appearing and in no acute distress. Eyes: Conjunctivae are normal.  Head: Atraumatic. Nose: No congestion/rhinnorhea. Mouth/Throat: Mucous membranes are moist.   Neck:  supple no lymphadenopathy noted Cardiovascular: Normal rate, regular rhythm. Heart sounds are normal Respiratory: Normal respiratory effort.  No retractions, lungs with diffuse wheezing bilaterally GU: deferred Musculoskeletal: FROM all extremities, warm and well perfused Neurologic:  Normal  speech and language.  Skin:  Skin is warm, dry and intact. No rash noted. Psychiatric: Mood and affect are normal. Speech and behavior are normal.  ____________________________________________   LABS (all labs ordered are listed, but only abnormal results are displayed)  Labs Reviewed - No data to display ____________________________________________   ____________________________________________  RADIOLOGY  Chest x-ray is normal  ____________________________________________   PROCEDURES  Procedure(s) performed: DuoNeb, albuterol nebulizer   Procedures    ____________________________________________   INITIAL IMPRESSION / ASSESSMENT AND PLAN / ED COURSE  Pertinent labs & imaging results that were available during my care of the patient were reviewed by me and considered in my medical decision making (see chart for details).   Patient is a 34 year old male presents emergency department with a asthma exasperation.  See HPI.  Physical exam shows diffuse wheezing bilaterally.  Patient had already been given a albuterol nebulizer treatment in triage but is still wheezing.  Did order a DuoNeb treatment and 10 mg of Decadron IM.  After the treatment patient states he is feeling better and has increased air movement bilaterally.  He was given a prescription for DuoNeb Nebules, Sterapred and discharged stable condition.  Return emergency department worsening.      As part of my medical decision making, I reviewed the following data within the [place] notes reviewed and incorporated, Old chart reviewed, Radiograph reviewed , Notes from prior ED visits and [place] Controlled Substance Database  ____________________________________________   FINAL CLINICAL IMPRESSION(S) / ED DIAGNOSES  Final diagnoses:  Mild intermittent asthma with acute exacerbation      NEW MEDICATIONS STARTED DURING THIS VISIT:  Discharge Medication List as of 12/15/2019   2:14 PM    START taking these medications   Details  !! ipratropium-albuterol (DUONEB) 0.5-2.5 (3) MG/3ML SOLN Take 3 mLs by nebulization every 4 (four) hours as needed., Starting Wed 12/15/2019, Normal    predniSONE (STERAPRED UNI-PAK 21 TAB) 10 MG (21) TBPK tablet Take 6 pills on day one then decrease by 1 pill each day, Normal     !! - Potential duplicate medications found. Please discuss with provider.       Note:  This document was prepared using Dragon voice recognition software and may include unintentional dictation errors.    Versie Starks, PA-C 12/15/19 1627    Lavonia Drafts, MD 12/15/19 [phone]

## 2019-12-28 ENCOUNTER — Telehealth: Payer: Self-pay | Admitting: General Practice

## 2019-12-28 NOTE — Telephone Encounter (Signed)
Individual has been contacted 3+ times regarding ED referral. No further attempts to contact individual will be made. 

## 2020-01-12 ENCOUNTER — Encounter: Payer: Self-pay | Admitting: Emergency Medicine

## 2020-01-12 ENCOUNTER — Other Ambulatory Visit: Payer: Self-pay

## 2020-01-12 ENCOUNTER — Emergency Department
Admission: EM | Admit: 2020-01-12 | Discharge: 2020-01-12 | Disposition: A | Discharge status: Home / Self Care | Payer: Medicaid Other | Attending: Student in an Organized Health Care Education/Training Program | Admitting: Student in an Organized Health Care Education/Training Program

## 2020-01-12 DIAGNOSIS — Z79899 Other long term (current) drug therapy: Secondary | ICD-10-CM | POA: Insufficient documentation

## 2020-01-12 DIAGNOSIS — J45901 Unspecified asthma with (acute) exacerbation: Secondary | ICD-10-CM

## 2020-01-12 DIAGNOSIS — Z87891 Personal history of nicotine dependence: Secondary | ICD-10-CM | POA: Insufficient documentation

## 2020-01-12 DIAGNOSIS — Z7952 Long term (current) use of systemic steroids: Secondary | ICD-10-CM | POA: Insufficient documentation

## 2020-01-12 DIAGNOSIS — J4541 Moderate persistent asthma with (acute) exacerbation: Secondary | ICD-10-CM | POA: Insufficient documentation

## 2020-01-12 MED ORDER — DEXAMETHASONE SODIUM PHOSPHATE 10 MG/ML IJ SOLN
10.0000 mg | Freq: Once | INTRAMUSCULAR | Status: AC
  Administered 2020-01-12: 10 mg via INTRAMUSCULAR
  Filled 2020-01-12: qty 1

## 2020-01-12 MED ORDER — IPRATROPIUM-ALBUTEROL 0.5-2.5 (3) MG/3ML IN SOLN
3.0000 mL | Freq: Once | RESPIRATORY_TRACT | Status: AC
  Administered 2020-01-12: 3 mL via RESPIRATORY_TRACT
  Filled 2020-01-12: qty 3

## 2020-01-12 MED ORDER — PREDNISONE 10 MG PO TABS: ORAL_TABLET | ORAL | 0 refills | Status: DC

## 2020-01-12 MED ORDER — ALBUTEROL SULFATE HFA 108 (90 BASE) MCG/ACT IN AERS: 2.0000 | INHALATION_SPRAY | Freq: Four times a day (QID) | RESPIRATORY_TRACT | 1 refills | Status: DC | PRN

## 2020-01-12 NOTE — ED Triage Notes (Signed)
C/O wheezing, cough x 1 week.  Patient has history of asthma.  Has been using inhaler, no improvement.

## 2020-01-12 NOTE — ED Notes (Signed)
See triage note  Presents with wheezing  States he developed cough with wheezing about 3-4 days ago  No fever   Min to no relief with inhaler

## 2020-01-12 NOTE — ED Provider Notes (Signed)
[facility] Emergency Department Provider Note  ____________________________________________   First MD Initiated Contact with Patient 01/12/20 0935     (approximate)  I have reviewed the triage vital signs and the nursing notes.   HISTORY  Chief Complaint Wheezing and Cough   HPI Shane Leach is a 34 y.o. male presents to the ED with complaint of wheezing and cough for 1 week.  Patient has history of asthma.  He states that he ran out of his albuterol inhaler but has been using nebulizer treatments at home.  He denies any fever, chills, nausea or vomiting.  He denies any Covid exposure, change in taste or smell or muscle aches.     Past Medical History:  Diagnosis Date  . Asthma   . Bronchitis   . Epilepsy ([place])   . Seizures [facility])     Patient Active Problem List   Diagnosis Date Noted  . Asthma exacerbation 11/17/2016    Past Surgical History:  Procedure Laterality Date  . testicular cancer     right testicle removed    Prior to Admission medications   Medication Sig Start Date End Date Taking? Authorizing Provider  albuterol (VENTOLIN HFA) 108 (90 Base) MCG/ACT inhaler Inhale 2 puffs into the lungs every 6 (six) hours as needed for wheezing or shortness of breath. 01/12/20   Johnn Hai, PA-C  levETIRAcetam (KEPPRA) 1000 MG tablet Take 1 tablet (1,000 mg total) by mouth 3 (three) times daily. 04/02/19 10/29/19  Gregor Hams, MD  LORazepam (ATIVAN) 0.5 MG tablet Take 1 tablet (0.5 mg total) by mouth 2 (two) times daily as needed for anxiety. 08/31/19 08/30/20  Lavonia Drafts, MD  predniSONE (DELTASONE) 10 MG tablet Take 6 tablets  today, on day 2 take 5 tablets, day 3 take 4 tablets, day 4 take 3 tablets, day 5 take  2 tablets and 1 tablet the last day 01/12/20   Johnn Hai, PA-C  ipratropium-albuterol (DUONEB) 0.5-2.5 (3) MG/3ML SOLN Take 3 mLs by nebulization every 4 (four) hours as needed. 12/15/19 01/12/20  Versie Starks, PA-C     Allergies Dilantin [phenytoin sodium extended], Penicillins, and Tegretol [carbamazepine]  No family history on file.  Social History Social History   Tobacco Use  . Smoking status: Former Smoker    Packs/day: 0.50    Types: Cigarettes    Quit date: 10/12/2014    Years since quitting: 5.2  . Smokeless tobacco: Never Used  Substance Use Topics  . Alcohol use: Not Currently  . Drug use: Not Currently    Types: Marijuana    Review of Systems Constitutional: No fever/chills Eyes: No visual changes. ENT: No sore throat. Cardiovascular: Denies chest pain. Respiratory: Denies shortness of breath.  Positive wheezing. Gastrointestinal: No abdominal pain.  No nausea, no vomiting.  No diarrhea.   Musculoskeletal: Negative for back pain. Skin: Negative for rash. Neurological: Negative for headaches, focal weakness or numbness.  ____________________________________________   PHYSICAL EXAM:  VITAL SIGNS: ED Triage Vitals  Enc Vitals Group     BP 01/12/20 0932 (!) 143/98     Pulse Rate 01/12/20 0932 88     Resp 01/12/20 0932 (!) 28     Temp 01/12/20 0932 98 F (36.7 C)     Temp Source 01/12/20 0932 Oral     SpO2 01/12/20 0932 97 %     Weight 01/12/20 0910 235 lb 0.2 oz (106.6 kg)     Height 01/12/20 0910 6' (1.829 m)  Head Circumference --      Peak Flow --      Pain Score 01/12/20 0910 0     Pain Loc --      Pain Edu? --      Excl. in [place]? --     Constitutional: Alert and oriented. Well appearing and in no acute distress. Eyes: Conjunctivae are normal.  Head: Atraumatic. Nose: No congestion/rhinnorhea. Mouth/Throat: Mucous membranes are moist.  Oropharynx non-erythematous. Neck: No stridor.   Hematological/Lymphatic/Immunilogical: No cervical lymphadenopathy. Cardiovascular: Normal rate, regular rhythm. Grossly normal heart sounds.  Good peripheral circulation. Respiratory: Normal respiratory effort.  No retractions. Lungs expiratory wheezes are heard  throughout.  Patient is able to speak in complete sentences without any difficulty. Gastrointestinal: Soft and nontender. No distention.  Musculoskeletal: Moves upper and lower extremities with any difficulty normal gait was noted. Neurologic:  Normal speech and language. No gross focal neurologic deficits are appreciated.  Skin:  Skin is warm, dry and intact. No rash noted. Psychiatric: Mood and affect are normal. Speech and behavior are normal.  ____________________________________________   LABS (all labs ordered are listed, but only abnormal results are displayed)  Labs Reviewed - No data to display   PROCEDURES  Procedure(s) performed (including Critical Care):  Procedures   ____________________________________________   INITIAL IMPRESSION / ASSESSMENT AND PLAN / ED COURSE  As part of my medical decision making, I reviewed the following data within the electronic MEDICAL RECORD NUMBER Notes from prior ED visits and  Controlled Substance Database  Shane Leach was evaluated in Emergency Department on 01/12/2020 for the symptoms described in the history of present illness. He was evaluated in the context of the global COVID-19 pandemic, which necessitated consideration that the patient might be at risk for infection with the SARS-CoV-2 virus that causes COVID-19. Institutional protocols and algorithms that pertain to the evaluation of patients at risk for COVID-19 are in a state of rapid change based on information released by regulatory bodies including the CDC and federal and state organizations. These policies and algorithms were followed during the patient's care in the ED.  34 year old male presents to the ED with complaint of wheezing and cough for the last 3 to 4 days.  Patient denies any symptoms of Covid or exposure.  He states that he has not had his inhaler for several days.  Patient has a history of asthma and this feels no different than his exacerbation of asthma.   Patient was given Decadron 10 mg IM along with a DuoNeb treatment with improvement.  Patient was placed on prednisone pack and albuterol inhaler was sent to the pharmacy.  Patient was improved prior to discharge. ____________________________________________   FINAL CLINICAL IMPRESSION(S) / ED DIAGNOSES  Final diagnoses:  Moderate asthma with exacerbation, unspecified whether persistent     ED Discharge Orders         Ordered    predniSONE (DELTASONE) 10 MG tablet     Discontinue  Reprint     01/12/20 1144    albuterol (VENTOLIN HFA) 108 (90 Base) MCG/ACT inhaler  Every 6 hours PRN     Discontinue  Reprint     01/12/20 1144           Note:  This document was prepared using Dragon voice recognition software and may include unintentional dictation errors.    Johnn Hai, PA-C 01/12/20 1524    Merlyn Lot, MD 01/12/20 [phone]

## 2020-01-12 NOTE — Discharge Instructions (Addendum)
Follow-up with your primary care provider if any continued problems or concerns.  Return to the emergency department if any severe worsening of your breathing.  Begin taking the prednisone as directed starting with 6 tablets today and tapering down.  Also your albuterol inhaler was sent to the pharmacy as well.

## 2020-01-20 ENCOUNTER — Encounter: Payer: Self-pay | Admitting: Emergency Medicine

## 2020-01-20 ENCOUNTER — Emergency Department
Admission: EM | Admit: 2020-01-20 | Discharge: 2020-01-20 | Disposition: A | Discharge status: Home / Self Care | Payer: Medicaid Other | Attending: Emergency Medicine | Admitting: Emergency Medicine

## 2020-01-20 ENCOUNTER — Other Ambulatory Visit: Payer: Self-pay

## 2020-01-20 DIAGNOSIS — G40909 Epilepsy, unspecified, not intractable, without status epilepticus: Secondary | ICD-10-CM | POA: Insufficient documentation

## 2020-01-20 DIAGNOSIS — Z87891 Personal history of nicotine dependence: Secondary | ICD-10-CM | POA: Insufficient documentation

## 2020-01-20 DIAGNOSIS — J45901 Unspecified asthma with (acute) exacerbation: Secondary | ICD-10-CM | POA: Insufficient documentation

## 2020-01-20 DIAGNOSIS — Z79899 Other long term (current) drug therapy: Secondary | ICD-10-CM | POA: Insufficient documentation

## 2020-01-20 LAB — CBC
HCT: 52.1 % — ABNORMAL HIGH (ref 39.0–52.0)
Hemoglobin: 17.5 g/dL — ABNORMAL HIGH (ref 13.0–17.0)
MCH: 31.2 pg (ref 26.0–34.0)
MCHC: 33.6 g/dL (ref 30.0–36.0)
MCV: 92.9 fL (ref 80.0–100.0)
Platelets: 279 10*3/uL (ref 150–400)
RBC: 5.61 MIL/uL (ref 4.22–5.81)
RDW: 13.2 % (ref 11.5–15.5)
WBC: 9.6 10*3/uL (ref 4.0–10.5)
nRBC: 0 % (ref 0.0–0.2)

## 2020-01-20 LAB — BASIC METABOLIC PANEL
Anion gap: 12 (ref 5–15)
BUN: 17 mg/dL (ref 6–20)
CO2: 24 mmol/L (ref 22–32)
Calcium: 9.6 mg/dL (ref 8.9–10.3)
Chloride: 104 mmol/L (ref 98–111)
Creatinine, Ser: 1.4 mg/dL — ABNORMAL HIGH (ref 0.61–1.24)
GFR calc Af Amer: >60 mL/min (ref >60)
GFR calc non Af Amer: >60 mL/min (ref >60)
Glucose, Bld: 107 mg/dL — ABNORMAL HIGH (ref 70–99)
Potassium: 4.6 mmol/L (ref 3.5–5.1)
Sodium: 140 mmol/L (ref 135–145)

## 2020-01-20 MED ORDER — LEVETIRACETAM 500 MG PO TABS
1000.0000 mg | ORAL_TABLET | Freq: Once | ORAL | Status: AC
  Administered 2020-01-20: 1000 mg via ORAL
  Filled 2020-01-20: qty 2

## 2020-01-20 MED ORDER — LEVETIRACETAM 500 MG PO TABS: 500.0000 mg | ORAL_TABLET | Freq: Two times a day (BID) | ORAL | 0 refills | Status: DC

## 2020-01-20 NOTE — ED Provider Notes (Signed)
[facility] Emergency Department Provider Note   ____________________________________________   First MD Initiated Contact with Patient 01/20/20 1248     (approximate)  I have reviewed the triage vital signs and the nursing notes.   HISTORY  Chief Complaint Seizures    HPI Shane Leach is a 34 y.o. male   here for evaluation of seizure  Patient reports that he recalls going to bed, and then waking up mostly on arrival to the ER. He reports that he was in bed and his wife witnessed him to have a seizure. He does report he has had significant decrease in sleep the last 2 weeks and has been caring for his mother who is a frail diabetic. Additionally he has been off of his Keppra which he used to take at 1000 mg 3 times a day for several months now. He does not currently take any seizure medicine  He has had a history of epilepsy since he was about 22 months old. Tells me he does not tolerate Dilantin well due to allergy  No recent illness. No fevers or chills. No headache. Feels achy a little bit in his muscles. Overall feels well, feels similar to how he does when he has had a seizure.  Past Medical History:  Diagnosis Date  . Asthma   . Bronchitis   . Epilepsy ([place])   . Seizures [facility])     Patient Active Problem List   Diagnosis Date Noted  . Asthma exacerbation 11/17/2016    Past Surgical History:  Procedure Laterality Date  . testicular cancer     right testicle removed    Prior to Admission medications   Medication Sig Start Date End Date Taking? Authorizing Provider  albuterol (VENTOLIN HFA) 108 (90 Base) MCG/ACT inhaler Inhale 2 puffs into the lungs every 6 (six) hours as needed for wheezing or shortness of breath. 01/12/20   Johnn Hai, PA-C  levETIRAcetam (KEPPRA) 500 MG tablet Take 1 tablet (500 mg total) by mouth 2 (two) times daily. 01/20/20   Delman Kitten, MD  predniSONE (DELTASONE) 10 MG tablet Take 6 tablets  today, on  day 2 take 5 tablets, day 3 take 4 tablets, day 4 take 3 tablets, day 5 take  2 tablets and 1 tablet the last day 01/12/20   Johnn Hai, PA-C  ipratropium-albuterol (DUONEB) 0.5-2.5 (3) MG/3ML SOLN Take 3 mLs by nebulization every 4 (four) hours as needed. 12/15/19 01/12/20  Versie Starks, PA-C    Allergies Dilantin [phenytoin sodium extended], Penicillins, and Tegretol [carbamazepine]  History reviewed. No pertinent family history.  Social History Social History   Tobacco Use  . Smoking status: Former Smoker    Packs/day: 0.50    Types: Cigarettes    Quit date: 10/12/2014    Years since quitting: 5.2  . Smokeless tobacco: Never Used  Substance Use Topics  . Alcohol use: Not Currently  . Drug use: Not Currently    Types: Marijuana    Review of Systems Constitutional: No fever/chills Eyes: No visual changes. ENT: No sore throat. No neck pain. Cardiovascular: Denies chest pain. Respiratory: Denies shortness of breath. Gastrointestinal: No abdominal pain.   Genitourinary: Negative for dysuria. Musculoskeletal: Negative for back pain. Denies injury or fall. A little aching in his muscles after the seizure. Skin: Negative for rash. Neurological: Negative for headaches, areas of focal weakness or numbness.    ____________________________________________   PHYSICAL EXAM:  VITAL SIGNS: ED Triage Vitals [01/20/20 0918]  Enc Vitals Group     BP (!) 146/106     Pulse Rate 87     Resp (!) 28     Temp 98.7 F (37.1 C)     Temp Source Oral     SpO2 94 %     Weight 235 lb 0.2 oz (106.6 kg)     Height 6' (1.829 m)     Head Circumference      Peak Flow      Pain Score 8     Pain Loc      Pain Edu?      Excl. in [place]?     Constitutional: Alert and oriented. Well appearing and in no acute distress. Exam performed about 12:45 PM Eyes: Conjunctivae are normal. Head: Atraumatic. Nose: No congestion/rhinnorhea. Mouth/Throat: Mucous membranes are moist. Neck: No stridor.    Cardiovascular: Normal rate, regular rhythm. Grossly normal heart sounds.  Good peripheral circulation. Respiratory: Normal respiratory effort.  No retractions. Lungs CTAB. Gastrointestinal: Soft and nontender. No distention. Musculoskeletal: No lower extremity tenderness nor edema. Neurologic:  Normal speech and language. No gross focal neurologic deficits are appreciated. Normal cranial nerve exam. Normal extraocular movements. No ataxia. No tremor. Normal muscle tone and strength in all extremities at this time. Skin:  Skin is warm, dry and intact. No rash noted. Psychiatric: Mood and affect are normal. Speech and behavior are normal.  ____________________________________________   LABS (all labs ordered are listed, but only abnormal results are displayed)  Labs Reviewed  BASIC METABOLIC PANEL - Abnormal; Notable for the following components:      Result Value   Glucose, Bld 107 (*)    Creatinine, Ser 1.40 (*)    All other components within normal limits  CBC - Abnormal; Notable for the following components:   Hemoglobin 17.5 (*)    HCT 52.1 (*)    All other components within normal limits  LEVETIRACETAM LEVEL   ____________________________________________  EKG  Reviewed inter by me at 920 Heart rate 99 QRS 109 QTc 430 Normal sinus rhythm, early repolarization abnormality.  Compared to previous EKG, suspicious for possible arm lead reversal  Repeat EKG performed at 1300 as concern for possible lead reversal on previous Heart rate 60 QRS 110 QTc 430 Left axis deviation. No evidence of acute ischemia. Mild repolarization-like abnormality. ____________________________________________  RADIOLOGY  No results found.  No noted indication for acute imaging ____________________________________________   PROCEDURES  Procedure(s) performed: None  Procedures  Critical Care performed: No  ____________________________________________   INITIAL IMPRESSION / ASSESSMENT  AND PLAN / ED COURSE  Pertinent labs & imaging results that were available during my care of the patient were reviewed by me and considered in my medical decision making (see chart for details).   Patient presents after reported seizure. He was in his bed. Had a seizure that was witnessed by his wife. He is not quite sure how long it went, tells me he does not think it last too long. Did not become fall or injured. He does feel little achy afterwards. Reports he was little confused and came to when he got to the ER.  His evaluation quite reassuring. Normal neurologic exam. History of seizure disorder off medications for months. Counseled patients on need for compliance with his seizure medications, seizure return precautions, and safe activities including avoiding swimming, being at heights, no driving for 6 months unless cleared by neurologist, and to avoid activities that he could become harmed if he were to have a  seizure  Patient agreeable. Discussed with our neurologist Dr. Rory Percy; recommends loading the patient (orally ok) with Keppra and consider resuming him at a high dose to resume at 500 mg twice daily. Follow-up with neurology. Discussed with the patient who is comfortable with this plan and agreeable. He is fully awake alert no distress. Not driving.  Return precautions and treatment recommendations and follow-up discussed with the patient who is agreeable with the plan.       ____________________________________________   FINAL CLINICAL IMPRESSION(S) / ED DIAGNOSES  Final diagnoses:  Seizure disorder [facility])        Note:  This document was prepared using Dragon voice recognition software and may include unintentional dictation errors       Delman Kitten, MD 01/20/20 1320

## 2020-01-20 NOTE — Discharge Instructions (Signed)

## 2020-01-20 NOTE — ED Triage Notes (Signed)
Patient here for seizures at home. Hx of same. Takes keppra 1000 mg tid. Patient alert and oriented but continues to be tense and shake.

## 2020-01-24 LAB — LEVETIRACETAM LEVEL: Levetiracetam Lvl: <1 ug/mL — ABNORMAL LOW (ref 10.0–40.0)

## 2020-03-22 ENCOUNTER — Other Ambulatory Visit: Payer: Self-pay

## 2020-03-22 ENCOUNTER — Emergency Department
Admission: EM | Admit: 2020-03-22 | Discharge: 2020-03-22 | Disposition: A | Discharge status: Home / Self Care | Payer: Medicaid Other | Attending: Emergency Medicine | Admitting: Emergency Medicine

## 2020-03-22 ENCOUNTER — Emergency Department: Payer: Medicaid Other

## 2020-03-22 DIAGNOSIS — Z20822 Contact with and (suspected) exposure to covid-19: Secondary | ICD-10-CM | POA: Insufficient documentation

## 2020-03-22 DIAGNOSIS — J45901 Unspecified asthma with (acute) exacerbation: Secondary | ICD-10-CM | POA: Insufficient documentation

## 2020-03-22 DIAGNOSIS — Z87891 Personal history of nicotine dependence: Secondary | ICD-10-CM | POA: Insufficient documentation

## 2020-03-22 DIAGNOSIS — J209 Acute bronchitis, unspecified: Secondary | ICD-10-CM | POA: Insufficient documentation

## 2020-03-22 LAB — CBC
HCT: 50 % (ref 39.0–52.0)
Hemoglobin: 17.2 g/dL — ABNORMAL HIGH (ref 13.0–17.0)
MCH: 31.1 pg (ref 26.0–34.0)
MCHC: 34.4 g/dL (ref 30.0–36.0)
MCV: 90.4 fL (ref 80.0–100.0)
Platelets: 233 10*3/uL (ref 150–400)
RBC: 5.53 MIL/uL (ref 4.22–5.81)
RDW: 13.7 % (ref 11.5–15.5)
WBC: 7.9 10*3/uL (ref 4.0–10.5)
nRBC: 0 % (ref 0.0–0.2)

## 2020-03-22 LAB — RESPIRATORY PANEL BY RT PCR (FLU A&B, COVID)
Influenza A by PCR: NEGATIVE
Influenza B by PCR: NEGATIVE
SARS Coronavirus 2 by RT PCR: NEGATIVE

## 2020-03-22 LAB — BASIC METABOLIC PANEL
Anion gap: 11 (ref 5–15)
BUN: 9 mg/dL (ref 6–20)
CO2: 22 mmol/L (ref 22–32)
Calcium: 9.5 mg/dL (ref 8.9–10.3)
Chloride: 105 mmol/L (ref 98–111)
Creatinine, Ser: 1.27 mg/dL — ABNORMAL HIGH (ref 0.61–1.24)
GFR, Estimated: >60 mL/min (ref >60)
Glucose, Bld: 105 mg/dL — ABNORMAL HIGH (ref 70–99)
Potassium: 4.5 mmol/L (ref 3.5–5.1)
Sodium: 138 mmol/L (ref 135–145)

## 2020-03-22 LAB — TROPONIN I (HIGH SENSITIVITY): Troponin I (High Sensitivity): 5 ng/L (ref <18)

## 2020-03-22 MED ORDER — DEXAMETHASONE 1 MG/ML PO CONC
10.0000 mg | Freq: Once | ORAL | Status: AC
  Administered 2020-03-22: 10 mg via ORAL
  Filled 2020-03-22: qty 10

## 2020-03-22 MED ORDER — IPRATROPIUM-ALBUTEROL 0.5-2.5 (3) MG/3ML IN SOLN
3.0000 mL | Freq: Once | RESPIRATORY_TRACT | Status: AC
  Administered 2020-03-22: 3 mL via RESPIRATORY_TRACT
  Filled 2020-03-22: qty 3

## 2020-03-22 MED ORDER — ALBUTEROL SULFATE (2.5 MG/3ML) 0.083% IN NEBU
2.5000 mg | INHALATION_SOLUTION | Freq: Four times a day (QID) | RESPIRATORY_TRACT | 12 refills | Status: DC | PRN
Start: 2020-03-22 — End: 2020-08-02

## 2020-03-22 NOTE — ED Provider Notes (Signed)
[facility] Emergency Department Provider Note   ____________________________________________   First MD Initiated Contact with Patient 03/22/20 1222     (approximate)  I have reviewed the triage vital signs and the nursing notes.   HISTORY  Chief Complaint Shortness of Breath    HPI Shane Leach is a 34 y.o. male for evaluation of cough and slight wheezing  Patient reports about the last 2 days been having a slight cough, mostly here because he has a history of "asthma" reports when this happens little bit wheezing.  Since just slight achiness in the muscles back but otherwise no pain or discomfort.  Feels just slightly short of breath with walking, has tried his home albuterol nebulizer at home couple treatments over the last couple days without relief   Denies having any acute chest pain.  No leg swelling.  No sharp component to pain.  No history of blood clots.  Does take Keppra for his seizures which he is compliant with now  No leg swelling.  No recent trauma or hospitalizations.  Denies any Covid exposure.  Not vaccinated.  No abdominal pain.  No headaches.  No muscle aches except slight in his achiness upper back  Past Medical History:  Diagnosis Date  . Asthma   . Bronchitis   . Epilepsy ([place])   . Seizures [facility])     Patient Active Problem List   Diagnosis Date Noted  . Asthma exacerbation 11/17/2016    Past Surgical History:  Procedure Laterality Date  . testicular cancer     right testicle removed    Prior to Admission medications   Medication Sig Start Date End Date Taking? Authorizing Provider  albuterol (PROVENTIL) (2.5 MG/3ML) 0.083% nebulizer solution Take 3 mLs (2.5 mg total) by nebulization every 6 (six) hours as needed for wheezing or shortness of breath. 03/22/20   Delman Kitten, MD  albuterol (VENTOLIN HFA) 108 (90 Base) MCG/ACT inhaler Inhale 2 puffs into the lungs every 6 (six) hours as needed for wheezing or  shortness of breath. 01/12/20   Johnn Hai, PA-C  levETIRAcetam (KEPPRA) 500 MG tablet Take 1 tablet (500 mg total) by mouth 2 (two) times daily. Patient not taking: Reported on 03/22/2020 01/20/20   Delman Kitten, MD  predniSONE (DELTASONE) 10 MG tablet Take 6 tablets  today, on day 2 take 5 tablets, day 3 take 4 tablets, day 4 take 3 tablets, day 5 take  2 tablets and 1 tablet the last day Patient not taking: Reported on 03/22/2020 01/12/20   Johnn Hai, PA-C  ipratropium-albuterol (DUONEB) 0.5-2.5 (3) MG/3ML SOLN Take 3 mLs by nebulization every 4 (four) hours as needed. 12/15/19 01/12/20  Versie Starks, PA-C    Allergies Dilantin [phenytoin sodium extended], Penicillins, and Tegretol [carbamazepine]  No family history on file.  Social History Social History   Tobacco Use  . Smoking status: Former Smoker    Packs/day: 0.50    Types: Cigarettes    Quit date: 10/12/2014    Years since quitting: 5.4  . Smokeless tobacco: Never Used  Substance Use Topics  . Alcohol use: Not Currently  . Drug use: Not Currently    Types: Marijuana    Review of Systems Constitutional: No fever/chills Eyes: No visual changes. ENT: No sore throat. Cardiovascular: Denies chest pain. Respiratory: See HPI Gastrointestinal: No abdominal pain.   Genitourinary: Negative for dysuria. Musculoskeletal: Negative for back pain. Skin: Negative for rash. Neurological: Negative for headaches, areas of focal  weakness or numbness.    ____________________________________________   PHYSICAL EXAM:  VITAL SIGNS: ED Triage Vitals  Enc Vitals Group     BP 03/22/20 1113 130/88     Pulse Rate 03/22/20 1113 77     Resp 03/22/20 1113 16     Temp 03/22/20 1113 98.2 F (36.8 C)     Temp Source 03/22/20 1113 Oral     SpO2 03/22/20 1113 98 %     Weight 03/22/20 1113 250 lb (113.4 kg)     Height 03/22/20 1113 6' (1.829 m)     Head Circumference --      Peak Flow --      Pain Score 03/22/20 1102 6      Pain Loc --      Pain Edu? --      Excl. in [place]? --     Constitutional: Alert and oriented. Well appearing and in no acute distress.  Resting very comfortably.  He is very pleasant speaks without distress. Eyes: Conjunctivae are normal. Head: Atraumatic. Nose: No congestion/rhinnorhea. Mouth/Throat: Mucous membranes are moist. Neck: No stridor.  Cardiovascular: Normal rate, regular rhythm. Grossly normal heart sounds.  Good peripheral circulation. Respiratory: Normal respiratory effort.  No retractions.  Moderate end expiratory wheezing in all fields.  Speaks with normal sentences though, no accessory muscle use.  Normal oxygen saturation on room air. Gastrointestinal: Soft and nontender. No distention. Musculoskeletal: No lower extremity tenderness nor edema. Neurologic:  Normal speech and language. No gross focal neurologic deficits are appreciated.  Skin:  Skin is warm, dry and intact. No rash noted. Psychiatric: Mood and affect are normal. Speech and behavior are normal.  ____________________________________________   LABS (all labs ordered are listed, but only abnormal results are displayed)  Labs Reviewed  BASIC METABOLIC PANEL - Abnormal; Notable for the following components:      Result Value   Glucose, Bld 105 (*)    Creatinine, Ser 1.27 (*)    All other components within normal limits  CBC - Abnormal; Notable for the following components:   Hemoglobin 17.2 (*)    All other components within normal limits  RESPIRATORY PANEL BY RT PCR (FLU A&B, COVID)  TROPONIN I (HIGH SENSITIVITY)  TROPONIN I (HIGH SENSITIVITY)   ____________________________________________  EKG  Reviewed interpreted at 11 AM Heart rate 75 QRS 100 QTc 410 Normal sinus rhythm, anterior fascicular block.  No evidence of acute ischemia denoted.  Compared with previous EKG no significant changes from January 20, 2020 ____________________________________________  RADIOLOGY  Chest x-ray is  personally viewed and reviewed by me.  No evidence of acute findings other than possible slight bronchitic appearance.  Radiologist also over read with similar read ____________________________________________   PROCEDURES  Procedure(s) performed: None  Procedures  Critical Care performed: No  ____________________________________________   INITIAL IMPRESSION / ASSESSMENT AND PLAN / ED COURSE  Pertinent labs & imaging results that were available during my care of the patient were reviewed by me and considered in my medical decision making (see chart for details).   Cough, slight wheezing.  Appears consistent with likely bronchitis, I suspect of likely viral etiology.  No evidence of infiltrate or pneumothorax.  No hypoxia, no noted risk factors for DVT or pulmonary embolism.  Low risk [place], also negative by [facility]  No evidence of acute cardiac disease.  Will treat as probable bronchitis with likely asthma type exacerbation.  Patient specifically requesting treatment with Decadron for steroid which she reports is worked well in the  past    After receiving treatment including nebulizer treatment, patient reports feeling improved.  Have refilled prescription for albuterol for him sent to his pharmacy of selection.  On reexamination he is resting comfortably no distress normal respiratory effort 100% SPO2.  Lungs improved just with slight faint wheezing but otherwise mostly clear.  Speaks in full clear sentences and reports he feels much better.  Peers consistent with bronchospasm likely secondary to mild bronchitis or asthma exacerbation.  Return precautions and treatment recommendations and follow-up discussed with the patient who is agreeable with the plan.   ____________________________________________   FINAL CLINICAL IMPRESSION(S) / ED DIAGNOSES  Final diagnoses:  Bronchitis, acute, with bronchospasm        Note:  This document was prepared using Dragon voice recognition  software and may include unintentional dictation errors       Delman Kitten, MD 03/22/20 1546

## 2020-03-22 NOTE — ED Triage Notes (Signed)
Pt comes via POV from home with c/o SOB. Pt states this started a week ago. Pt denies any CP. Pt states productive cough and some congestion  Pt also states pain in middle upper back.

## 2020-03-22 NOTE — ED Triage Notes (Signed)
Pt comes with c/o SOB. Pt has cough present. Pt doesn't appear in any distress.

## 2020-03-22 NOTE — ED Notes (Signed)
Zach RN notified of pt in room

## 2020-04-11 IMAGING — CR DG CHEST 2V
1 series · 2 of 2 positions shown · non-contrast
Comparison: Portable chest x-ray of 04/16/2017

CLINICAL DATA: Left-sided chest pain radiating to the back today,
some shortness of breath, smoking

EXAM:
CHEST - 2 VIEW

[Series 1: dg chest 2 view · 0.14mm/px · 2 of 2 slices shown]
[im 1/2]
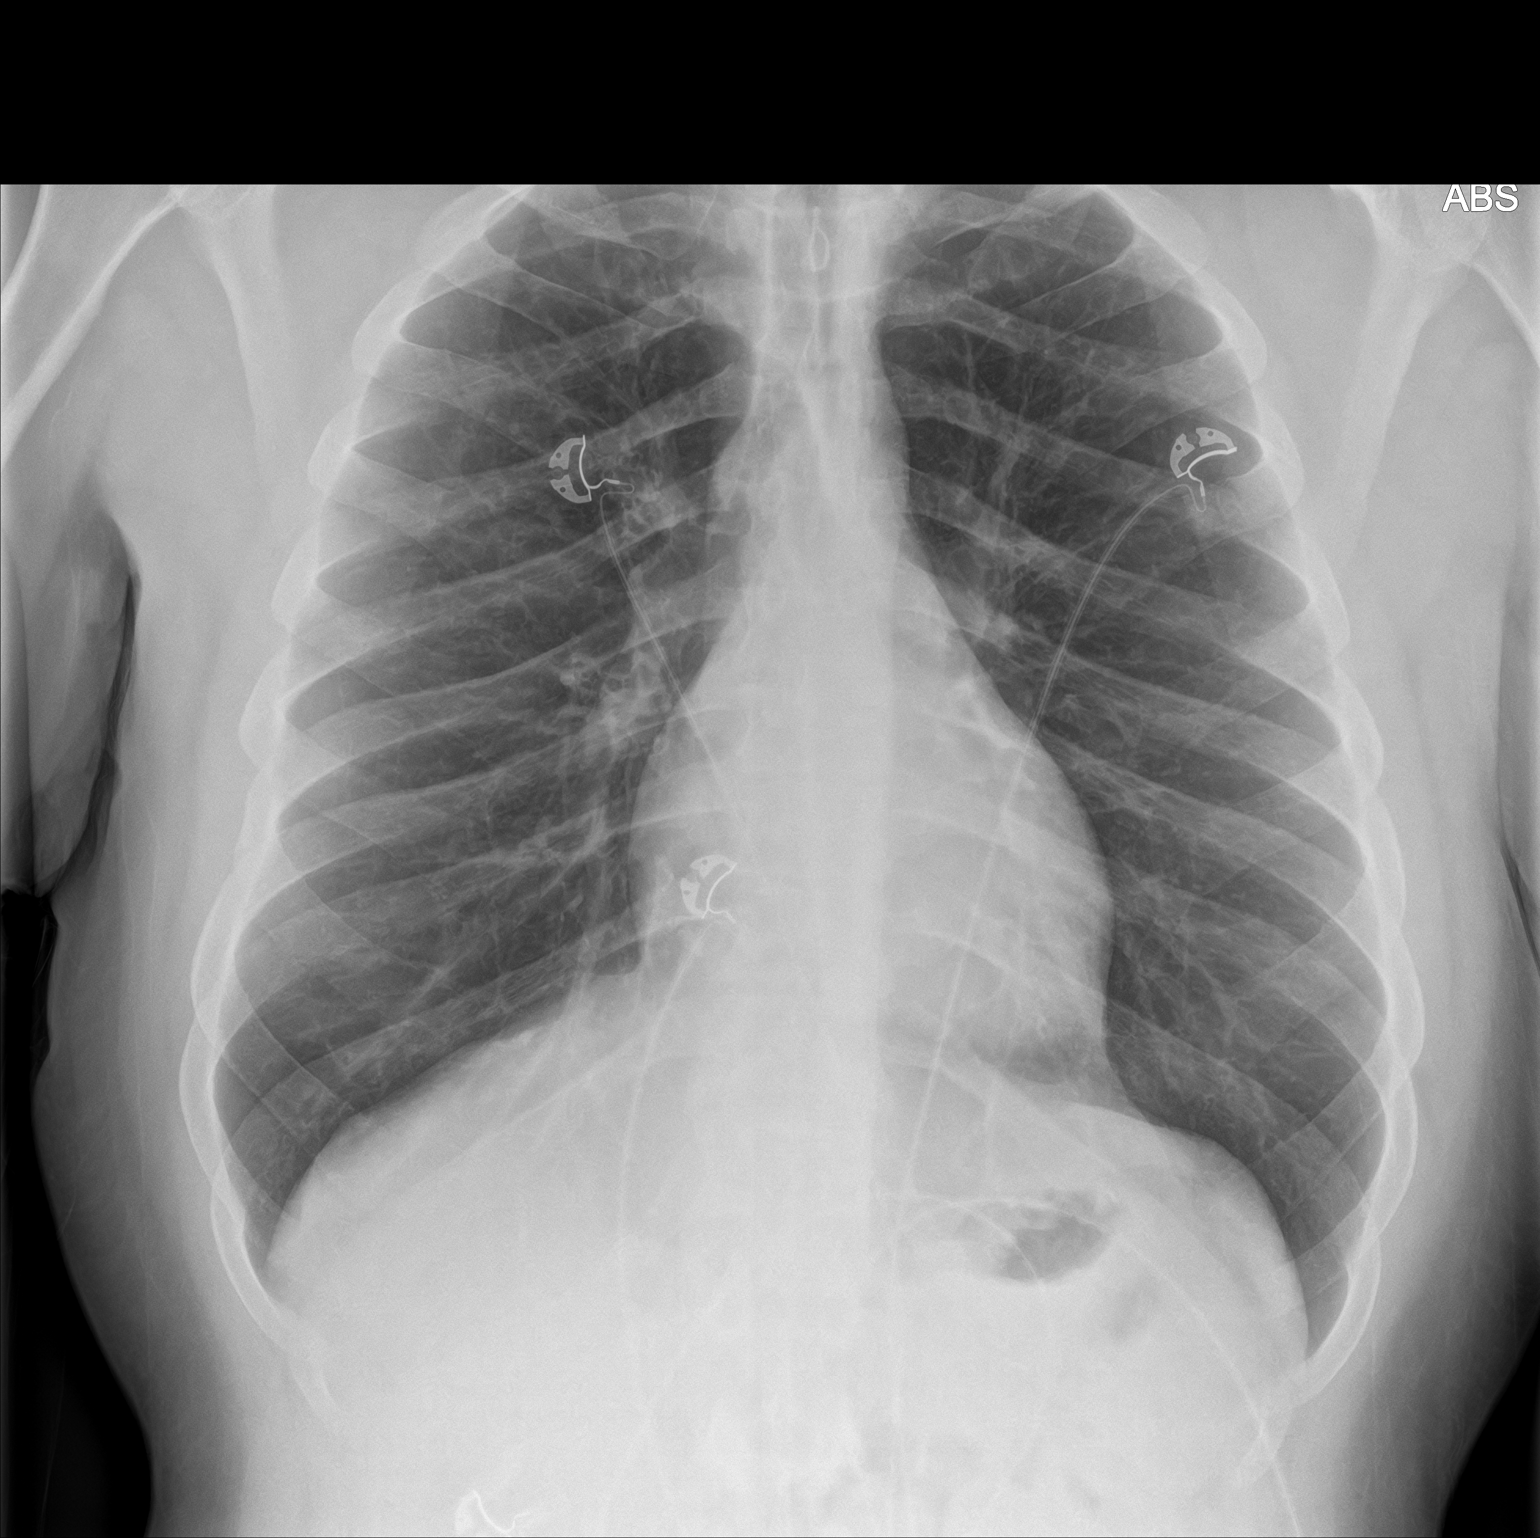
[im 2/2]
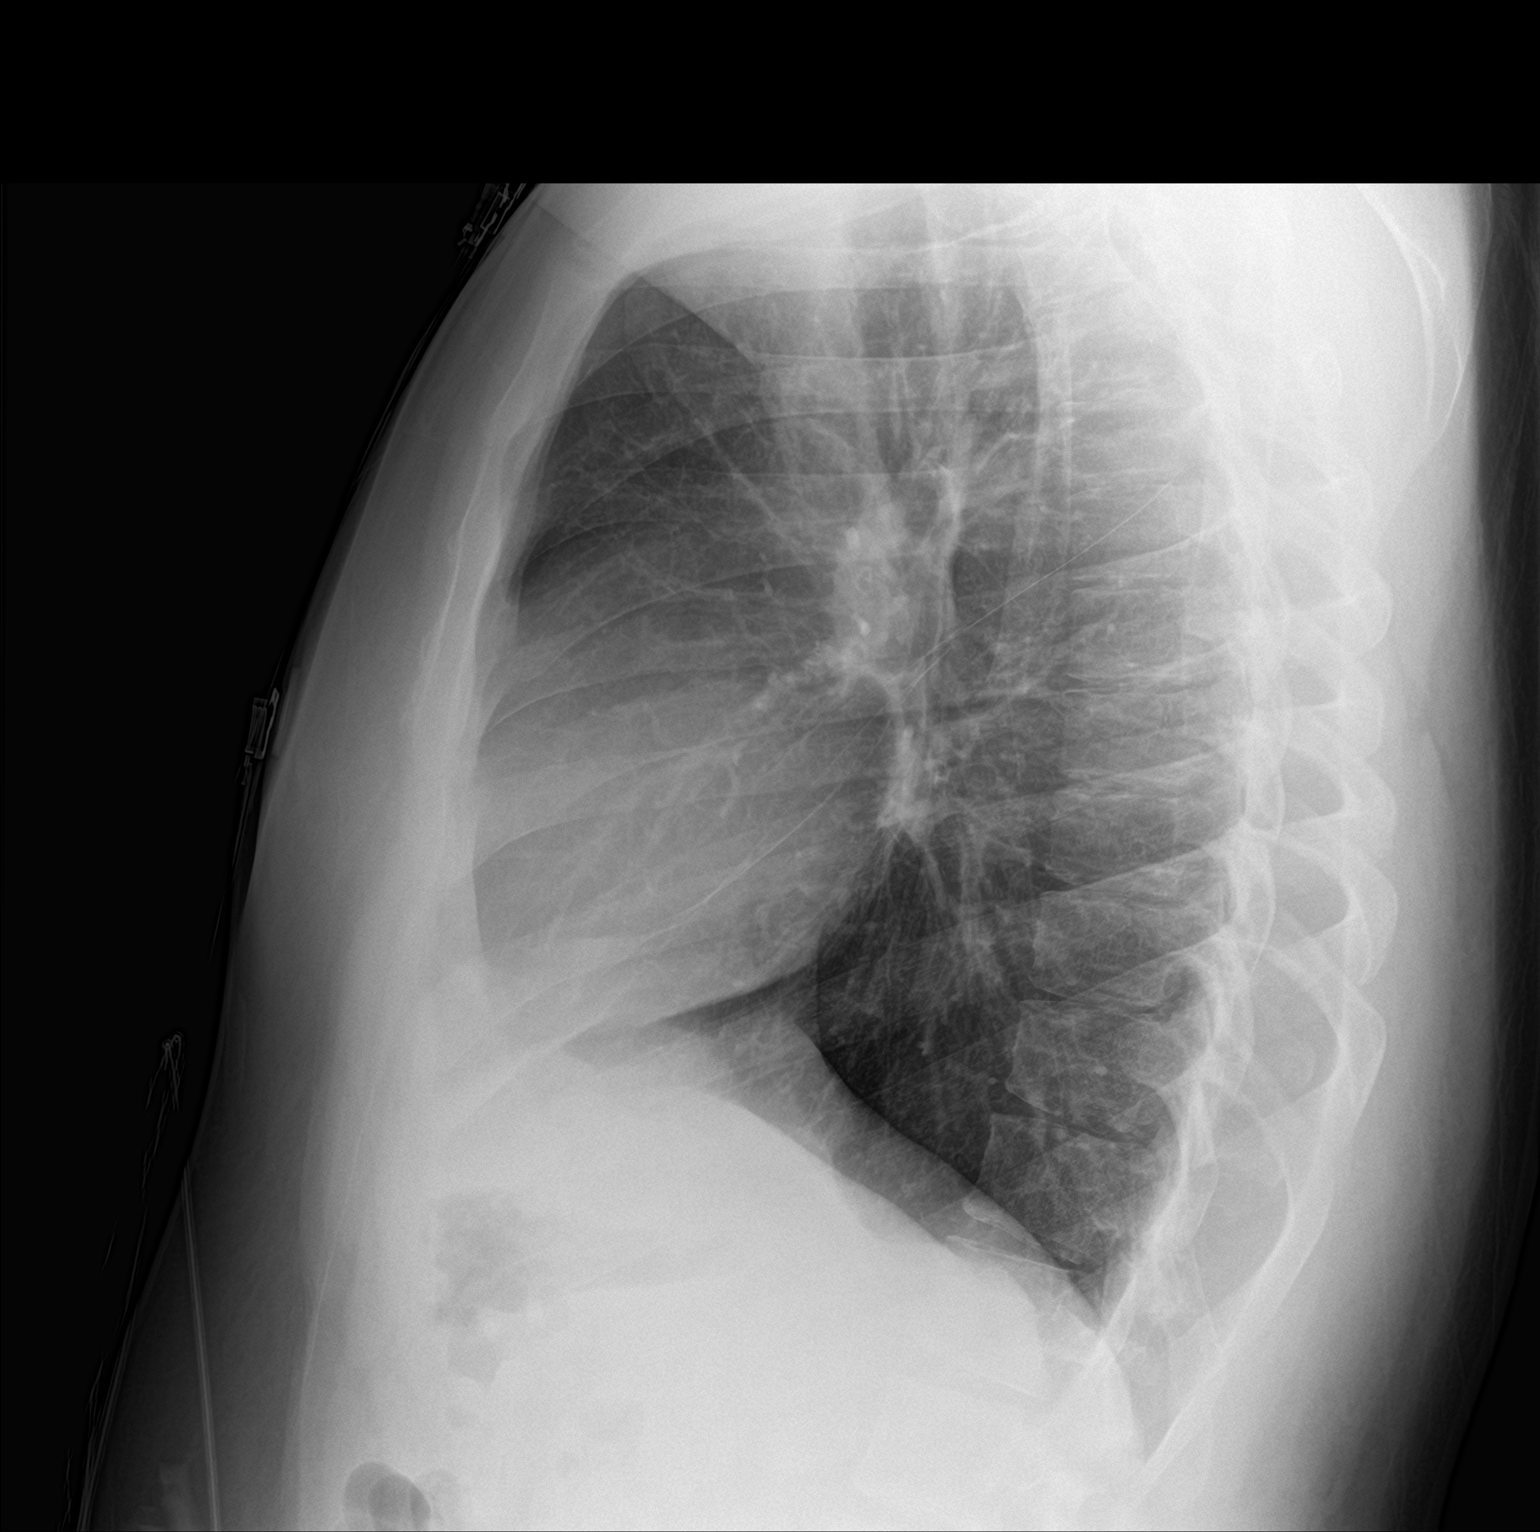

[2 of 2 positions shown; findings below may reference images not displayed]

FINDINGS: The lungs remain clear but somewhat hyperaerated. Mediastinal and
hilar contours are unremarkable. The heart is within normal limits
in size. No bony abnormality is seen.
IMPRESSION: Hyperaeration.  No active lung disease.

## 2020-06-19 DIAGNOSIS — I2699 Other pulmonary embolism without acute cor pulmonale: Secondary | ICD-10-CM

## 2020-06-19 HISTORY — DX: Other pulmonary embolism without acute cor pulmonale: I26.99

## 2020-07-14 ENCOUNTER — Emergency Department
Admission: EM | Admit: 2020-07-14 | Discharge: 2020-07-14 | Disposition: A | Discharge status: Home / Self Care | Payer: Medicaid Other | Attending: Emergency Medicine | Admitting: Emergency Medicine

## 2020-07-14 ENCOUNTER — Other Ambulatory Visit: Payer: Self-pay

## 2020-07-14 ENCOUNTER — Encounter: Payer: Self-pay | Admitting: Emergency Medicine

## 2020-07-14 DIAGNOSIS — J4520 Mild intermittent asthma, uncomplicated: Secondary | ICD-10-CM | POA: Insufficient documentation

## 2020-07-14 DIAGNOSIS — Z87891 Personal history of nicotine dependence: Secondary | ICD-10-CM | POA: Insufficient documentation

## 2020-07-14 MED ORDER — ALBUTEROL SULFATE HFA 108 (90 BASE) MCG/ACT IN AERS
2.0000 | INHALATION_SPRAY | RESPIRATORY_TRACT | Status: DC | PRN
  Administered 2020-07-14: 2 via RESPIRATORY_TRACT
  Filled 2020-07-14: qty 6.7

## 2020-07-14 MED ORDER — PREDNISONE 10 MG PO TABS
ORAL_TABLET | ORAL | 0 refills | Status: DC
Start: 2020-07-14 — End: 2020-09-06

## 2020-07-14 MED ORDER — DEXAMETHASONE SODIUM PHOSPHATE 10 MG/ML IJ SOLN
10.0000 mg | Freq: Once | INTRAMUSCULAR | Status: AC
  Administered 2020-07-14: 10 mg via INTRAMUSCULAR
  Filled 2020-07-14: qty 1

## 2020-07-14 NOTE — Discharge Instructions (Addendum)
Follow-up with your primary care provider if any continued problems or concerns.  Continue using your albuterol inhaler at home.  A prescription for prednisone was sent to your pharmacy.  Return to the emergency department if any worsening of your symptoms such as fever, chills, shortness of breath or difficulty breathing.

## 2020-07-14 NOTE — ED Triage Notes (Signed)
Pt to ED via POV for asthma flare. Pt states that has been going on for about 1 week. Pt has been using neb treatments at home without relief. Pt states that he normally has to get a shot of prednisone to clear it up. Pt has audible wheezing in triage but is in NAD.

## 2020-07-14 NOTE — ED Provider Notes (Signed)
[facility] Emergency Department Provider Note  ____________________________________________   Event Date/Time   First MD Initiated Contact with Patient 07/14/20 1005     (approximate)  I have reviewed the triage vital signs and the nursing notes.   HISTORY  Chief Complaint Asthma   HPI Shane Leach is a 35 y.o. male presents to the ED with complaint of shortness of breath.  Patient states he has a history of asthma and usually has an asthma flareup once or twice a year.  He states that he has been treating his flareup for approximately 1 week with his inhalers without any relief.  He denies any other respiratory symptoms such as fever, chills, cough or exposure to anyone with Covid.  Patient is a former smoker.  He denies any pain at this time.     Past Medical History:  Diagnosis Date  . Asthma   . Bronchitis   . Epilepsy ([place])   . Seizures [facility])     Patient Active Problem List   Diagnosis Date Noted  . Asthma exacerbation 11/17/2016    Past Surgical History:  Procedure Laterality Date  . testicular cancer     right testicle removed    Prior to Admission medications   Medication Sig Start Date End Date Taking? Authorizing Provider  predniSONE (DELTASONE) 10 MG tablet Take 6 tablets  today, on day 2 take 5 tablets, day 3 take 4 tablets, day 4 take 3 tablets, day 5 take  2 tablets and 1 tablet the last day 07/14/20  Yes Letitia Neri L, PA-C  albuterol (PROVENTIL) (2.5 MG/3ML) 0.083% nebulizer solution Take 3 mLs (2.5 mg total) by nebulization every 6 (six) hours as needed for wheezing or shortness of breath. 03/22/20   Delman Kitten, MD  albuterol (VENTOLIN HFA) 108 (90 Base) MCG/ACT inhaler Inhale 2 puffs into the lungs every 6 (six) hours as needed for wheezing or shortness of breath. 01/12/20   Johnn Hai, PA-C  ipratropium-albuterol (DUONEB) 0.5-2.5 (3) MG/3ML SOLN Take 3 mLs by nebulization every 4 (four) hours as needed. 12/15/19  01/12/20  Versie Starks, PA-C    Allergies Dilantin [phenytoin sodium extended], Penicillins, and Tegretol [carbamazepine]  No family history on file.  Social History Social History   Tobacco Use  . Smoking status: Former Smoker    Packs/day: 0.50    Types: Cigarettes    Quit date: 10/12/2014    Years since quitting: 5.7  . Smokeless tobacco: Never Used  Substance Use Topics  . Alcohol use: Not Currently  . Drug use: Not Currently    Types: Marijuana    Review of Systems Constitutional: No fever/chills Eyes: No visual changes. ENT: No sore throat. Cardiovascular: Denies chest pain. Respiratory: Positive shortness of breath.  Positive wheezing. Gastrointestinal: No abdominal pain.  No nausea, no vomiting.   Musculoskeletal: Negative for back pain. Skin: Negative for rash. Neurological: Negative for headaches, focal weakness or numbness. ____________________________________________   PHYSICAL EXAM:  VITAL SIGNS: ED Triage Vitals  Enc Vitals Group     BP 07/14/20 0946 119/67     Pulse Rate 07/14/20 0946 (!) 102     Resp 07/14/20 0946 20     Temp 07/14/20 0946 97.9 F (36.6 C)     Temp Source 07/14/20 0946 Oral     SpO2 07/14/20 0946 97 %     Weight 07/14/20 0947 240 lb (108.9 kg)     Height 07/14/20 0947 6' (1.829 m)  Head Circumference --      Peak Flow --      Pain Score 07/14/20 0949 0     Pain Loc --      Pain Edu? --      Excl. in [place]? --    Constitutional: Alert and oriented. Well appearing and in no acute distress.  Patient is able to speak in complete sentences without any shortness of breath or difficulty breathing. Eyes: Conjunctivae are normal.  Head: Atraumatic. Neck: No stridor.   Cardiovascular: Normal rate, regular rhythm. Grossly normal heart sounds.  Good peripheral circulation. Respiratory: Normal respiratory effort.  No retractions. Lungs bilateral expiratory wheezes heard throughout. Gastrointestinal: Soft and nontender. No distention.   Musculoskeletal: Moves upper and lower extremities with any difficulty.  Normal gait was noted. Neurologic:  Normal speech and language. No gross focal neurologic deficits are appreciated. No gait instability. Skin:  Skin is warm, dry and intact. No rash noted. Psychiatric: Mood and affect are normal. Speech and behavior are normal.  ____________________________________________   LABS (all labs ordered are listed, but only abnormal results are displayed)  Labs Reviewed - No data to display  PROCEDURES  Procedure(s) performed (including Critical Care):  Procedures   ____________________________________________   INITIAL IMPRESSION / ASSESSMENT AND PLAN / ED COURSE  As part of my medical decision making, I reviewed the following data within the electronic MEDICAL RECORD NUMBER Notes from prior ED visits and  Controlled Substance Database  35 year old male presents to the ED with shortness of breath from his long history of asthma.  Patient states that he generally has an exacerbation of his asthma once or twice a year.  He generally clears and does well with steroids.  Patient continues to use his albuterol inhaler at home.  Patient was given Decadron 10 mg IM and was much improved prior to discharge.  He is aware that medication was sent to his pharmacy.  He is to follow-up with his PCP if any continued problems or return to the emergency department over the weekend if any worsening of his symptoms.  ____________________________________________   FINAL CLINICAL IMPRESSION(S) / ED DIAGNOSES  Final diagnoses:  Mild intermittent asthma without complication     ED Discharge Orders         Ordered    predniSONE (DELTASONE) 10 MG tablet        07/14/20 1046          *Please note:  Shane Leach was evaluated in Emergency Department on 07/14/2020 for the symptoms described in the history of present illness. He was evaluated in the context of the global COVID-19 pandemic,  which necessitated consideration that the patient might be at risk for infection with the SARS-CoV-2 virus that causes COVID-19. Institutional protocols and algorithms that pertain to the evaluation of patients at risk for COVID-19 are in a state of rapid change based on information released by regulatory bodies including the CDC and federal and state organizations. These policies and algorithms were followed during the patient's care in the ED.  Some ED evaluations and interventions may be delayed as a result of limited staffing during and the pandemic.*   Note:  This document was prepared using Dragon voice recognition software and may include unintentional dictation errors.    Johnn Hai, PA-C 07/14/20 1150    Lucrezia Starch, MD 07/14/20 1240

## 2020-07-14 NOTE — ED Notes (Signed)
See triage note  Presents with some SOB  States he has a hx of asthma and states he has been having an asthma flare for 1 weeks  Min to no relief with inhaler and svn tx's   No fever

## 2020-08-01 ENCOUNTER — Encounter: Payer: Self-pay | Admitting: Emergency Medicine

## 2020-08-01 ENCOUNTER — Other Ambulatory Visit: Payer: Self-pay

## 2020-08-01 ENCOUNTER — Observation Stay: Payer: Self-pay

## 2020-08-01 ENCOUNTER — Emergency Department: Payer: Self-pay

## 2020-08-01 ENCOUNTER — Observation Stay
Admission: EM | Admit: 2020-08-01 | Discharge: 2020-08-02 | Disposition: A | Discharge status: Home / Self Care | Payer: Self-pay | Attending: Internal Medicine | Admitting: Internal Medicine

## 2020-08-01 ENCOUNTER — Observation Stay (HOSPITAL_BASED_OUTPATIENT_CLINIC_OR_DEPARTMENT_OTHER)
Admit: 2020-08-01 | Discharge: 2020-08-01 | Disposition: A | Discharge status: Home / Self Care | Payer: Self-pay | Attending: Internal Medicine | Admitting: Internal Medicine

## 2020-08-01 DIAGNOSIS — J4520 Mild intermittent asthma, uncomplicated: Secondary | ICD-10-CM

## 2020-08-01 DIAGNOSIS — Z8547 Personal history of malignant neoplasm of testis: Secondary | ICD-10-CM | POA: Insufficient documentation

## 2020-08-01 DIAGNOSIS — Z7901 Long term (current) use of anticoagulants: Secondary | ICD-10-CM | POA: Insufficient documentation

## 2020-08-01 DIAGNOSIS — I82409 Acute embolism and thrombosis of unspecified deep veins of unspecified lower extremity: Secondary | ICD-10-CM | POA: Insufficient documentation

## 2020-08-01 DIAGNOSIS — Z87891 Personal history of nicotine dependence: Secondary | ICD-10-CM | POA: Insufficient documentation

## 2020-08-01 DIAGNOSIS — I2699 Other pulmonary embolism without acute cor pulmonale: Principal | ICD-10-CM | POA: Diagnosis present

## 2020-08-01 DIAGNOSIS — G40909 Epilepsy, unspecified, not intractable, without status epilepticus: Secondary | ICD-10-CM | POA: Insufficient documentation

## 2020-08-01 DIAGNOSIS — R9389 Abnormal findings on diagnostic imaging of other specified body structures: Secondary | ICD-10-CM | POA: Insufficient documentation

## 2020-08-01 DIAGNOSIS — R0602 Shortness of breath: Secondary | ICD-10-CM

## 2020-08-01 DIAGNOSIS — J45909 Unspecified asthma, uncomplicated: Secondary | ICD-10-CM

## 2020-08-01 DIAGNOSIS — Z7952 Long term (current) use of systemic steroids: Secondary | ICD-10-CM | POA: Insufficient documentation

## 2020-08-01 DIAGNOSIS — Z79899 Other long term (current) drug therapy: Secondary | ICD-10-CM | POA: Insufficient documentation

## 2020-08-01 DIAGNOSIS — I2693 Single subsegmental pulmonary embolism without acute cor pulmonale: Secondary | ICD-10-CM

## 2020-08-01 DIAGNOSIS — J45998 Other asthma: Secondary | ICD-10-CM | POA: Insufficient documentation

## 2020-08-01 DIAGNOSIS — Z20822 Contact with and (suspected) exposure to covid-19: Secondary | ICD-10-CM | POA: Insufficient documentation

## 2020-08-01 DIAGNOSIS — F1020 Alcohol dependence, uncomplicated: Secondary | ICD-10-CM | POA: Diagnosis present

## 2020-08-01 DIAGNOSIS — R079 Chest pain, unspecified: Secondary | ICD-10-CM

## 2020-08-01 LAB — BASIC METABOLIC PANEL
Anion gap: 8 (ref 5–15)
BUN: 11 mg/dL (ref 6–20)
CO2: 24 mmol/L (ref 22–32)
Calcium: 9.6 mg/dL (ref 8.9–10.3)
Chloride: 105 mmol/L (ref 98–111)
Creatinine, Ser: 1.23 mg/dL (ref 0.61–1.24)
GFR, Estimated: >60 mL/min (ref >60)
Glucose, Bld: 101 mg/dL — ABNORMAL HIGH (ref 70–99)
Potassium: 5 mmol/L (ref 3.5–5.1)
Sodium: 137 mmol/L (ref 135–145)

## 2020-08-01 LAB — PROTIME-INR
INR: 1 (ref 0.8–1.2)
Prothrombin Time: 12.5 seconds (ref 11.4–15.2)

## 2020-08-01 LAB — CBC WITH DIFFERENTIAL/PLATELET
Abs Immature Granulocytes: 0.04 10*3/uL (ref 0.00–0.07)
Basophils Absolute: 0.1 10*3/uL (ref 0.0–0.1)
Basophils Relative: 1 %
Eosinophils Absolute: 0.4 10*3/uL (ref 0.0–0.5)
Eosinophils Relative: 4 %
HCT: 52.1 % — ABNORMAL HIGH (ref 39.0–52.0)
Hemoglobin: 17.6 g/dL — ABNORMAL HIGH (ref 13.0–17.0)
Immature Granulocytes: 0 %
Lymphocytes Relative: 11 %
Lymphs Abs: 1.1 10*3/uL (ref 0.7–4.0)
MCH: 31.4 pg (ref 26.0–34.0)
MCHC: 33.8 g/dL (ref 30.0–36.0)
MCV: 93 fL (ref 80.0–100.0)
Monocytes Absolute: 0.9 10*3/uL (ref 0.1–1.0)
Monocytes Relative: 9 %
Neutro Abs: 7.5 10*3/uL (ref 1.7–7.7)
Neutrophils Relative %: 75 %
Platelets: 319 10*3/uL (ref 150–400)
RBC: 5.6 MIL/uL (ref 4.22–5.81)
RDW: 13.2 % (ref 11.5–15.5)
WBC: 9.9 10*3/uL (ref 4.0–10.5)
nRBC: 0 % (ref 0.0–0.2)

## 2020-08-01 LAB — HIV ANTIBODY (ROUTINE TESTING W REFLEX): HIV Screen 4th Generation wRfx: NONREACTIVE

## 2020-08-01 LAB — HEPARIN LEVEL (UNFRACTIONATED)
Heparin Unfractionated: 0.5 IU/mL (ref 0.30–0.70)
Heparin Unfractionated: 0.46 IU/mL (ref 0.30–0.70)

## 2020-08-01 LAB — RESP PANEL BY RT-PCR (FLU A&B, COVID) ARPGX2
Influenza A by PCR: NEGATIVE
Influenza B by PCR: NEGATIVE
SARS Coronavirus 2 by RT PCR: NEGATIVE

## 2020-08-01 LAB — APTT: aPTT: 32 seconds (ref 24–36)

## 2020-08-01 LAB — PHOSPHORUS: Phosphorus: 2.5 mg/dL (ref 2.5–4.6)

## 2020-08-01 LAB — MAGNESIUM: Magnesium: 2.1 mg/dL (ref 1.7–2.4)

## 2020-08-01 MED ORDER — PREDNISONE 20 MG PO TABS: 10.0000 mg | ORAL_TABLET | Freq: Once | ORAL | Status: DC

## 2020-08-01 MED ORDER — LORAZEPAM 1 MG PO TABS: 1.0000 mg | ORAL_TABLET | ORAL | Status: DC | PRN

## 2020-08-01 MED ORDER — MORPHINE SULFATE (PF) 4 MG/ML IV SOLN
4.0000 mg | Freq: Once | INTRAVENOUS | Status: AC
  Administered 2020-08-01: 4 mg via INTRAVENOUS
  Filled 2020-08-01: qty 1

## 2020-08-01 MED ORDER — HEPARIN BOLUS VIA INFUSION
7000.0000 [IU] | Freq: Once | INTRAVENOUS | Status: AC
  Administered 2020-08-01: 7000 [IU] via INTRAVENOUS
  Filled 2020-08-01: qty 7000

## 2020-08-01 MED ORDER — SODIUM CHLORIDE 0.9% FLUSH
3.0000 mL | Freq: Two times a day (BID) | INTRAVENOUS | Status: DC
  Administered 2020-08-01 – 2020-08-02 (×3): 3 mL via INTRAVENOUS

## 2020-08-01 MED ORDER — ADULT MULTIVITAMIN W/MINERALS CH
1.0000 | ORAL_TABLET | Freq: Every day | ORAL | Status: DC
  Administered 2020-08-01 – 2020-08-02 (×2): 1 via ORAL
  Filled 2020-08-01 (×2): qty 1

## 2020-08-01 MED ORDER — PREDNISONE 5 MG PO TABS: 15.0000 mg | ORAL_TABLET | Freq: Once | ORAL | Status: DC

## 2020-08-01 MED ORDER — THIAMINE HCL 100 MG/ML IJ SOLN: 100.0000 mg | Freq: Every day | INTRAMUSCULAR | Status: DC

## 2020-08-01 MED ORDER — FOLIC ACID 1 MG PO TABS
1.0000 mg | ORAL_TABLET | Freq: Every day | ORAL | Status: DC
  Administered 2020-08-01 – 2020-08-02 (×2): 1 mg via ORAL
  Filled 2020-08-01 (×2): qty 1

## 2020-08-01 MED ORDER — ONDANSETRON HCL 4 MG/2ML IJ SOLN: 4.0000 mg | Freq: Four times a day (QID) | INTRAMUSCULAR | Status: DC | PRN

## 2020-08-01 MED ORDER — PREDNISONE 20 MG PO TABS
30.0000 mg | ORAL_TABLET | Freq: Once | ORAL | Status: AC
  Administered 2020-08-01: 30 mg via ORAL
  Filled 2020-08-01: qty 2

## 2020-08-01 MED ORDER — ONDANSETRON HCL 4 MG/2ML IJ SOLN
INTRAMUSCULAR | Status: AC
  Administered 2020-08-01: 4 mg via INTRAVENOUS
  Filled 2020-08-01: qty 2

## 2020-08-01 MED ORDER — HYDROCODONE-ACETAMINOPHEN 5-325 MG PO TABS
1.0000 | ORAL_TABLET | ORAL | Status: DC | PRN
  Administered 2020-08-01 – 2020-08-02 (×4): 1 via ORAL
  Filled 2020-08-01 (×4): qty 1

## 2020-08-01 MED ORDER — ONDANSETRON HCL 4 MG/2ML IJ SOLN: 4.0000 mg | Freq: Once | INTRAMUSCULAR | Status: AC

## 2020-08-01 MED ORDER — SODIUM CHLORIDE 0.9% FLUSH: 3.0000 mL | INTRAVENOUS | Status: DC | PRN

## 2020-08-01 MED ORDER — IOHEXOL 350 MG/ML SOLN
75.0000 mL | Freq: Once | INTRAVENOUS | Status: AC | PRN
  Administered 2020-08-01: 75 mL via INTRAVENOUS

## 2020-08-01 MED ORDER — IPRATROPIUM-ALBUTEROL 0.5-2.5 (3) MG/3ML IN SOLN
3.0000 mL | Freq: Four times a day (QID) | RESPIRATORY_TRACT | Status: DC
  Administered 2020-08-01 – 2020-08-02 (×3): 3 mL via RESPIRATORY_TRACT
  Filled 2020-08-01 (×3): qty 3

## 2020-08-01 MED ORDER — ALBUTEROL SULFATE HFA 108 (90 BASE) MCG/ACT IN AERS
2.0000 | INHALATION_SPRAY | Freq: Four times a day (QID) | RESPIRATORY_TRACT | Status: DC | PRN
  Filled 2020-08-01: qty 6.7

## 2020-08-01 MED ORDER — ONDANSETRON HCL 4 MG PO TABS: 4.0000 mg | ORAL_TABLET | Freq: Four times a day (QID) | ORAL | Status: DC | PRN

## 2020-08-01 MED ORDER — HEPARIN (PORCINE) 25000 UT/250ML-% IV SOLN
1700.0000 [IU]/h | INTRAVENOUS | Status: DC
  Administered 2020-08-01 – 2020-08-02 (×3): 1700 [IU]/h via INTRAVENOUS
  Filled 2020-08-01 (×3): qty 250

## 2020-08-01 MED ORDER — THIAMINE HCL 100 MG PO TABS
100.0000 mg | ORAL_TABLET | Freq: Every day | ORAL | Status: DC
  Administered 2020-08-01 – 2020-08-02 (×2): 100 mg via ORAL
  Filled 2020-08-01 (×2): qty 1

## 2020-08-01 MED ORDER — PREDNISONE 10 MG PO TABS: 5.0000 mg | ORAL_TABLET | Freq: Every day | ORAL | Status: DC

## 2020-08-01 MED ORDER — PREDNISONE 20 MG PO TABS: 20.0000 mg | ORAL_TABLET | Freq: Once | ORAL | Status: DC

## 2020-08-01 MED ORDER — PREDNISONE 5 MG PO TABS
25.0000 mg | ORAL_TABLET | Freq: Once | ORAL | Status: AC
  Administered 2020-08-02: 25 mg via ORAL
  Filled 2020-08-01: qty 1

## 2020-08-01 MED ORDER — PREDNISONE 5 MG PO TABS: 5.0000 mg | ORAL_TABLET | Freq: Once | ORAL | Status: DC

## 2020-08-01 MED ORDER — SODIUM CHLORIDE 0.9 % IV SOLN
250.0000 mL | INTRAVENOUS | Status: DC | PRN
Start: 2020-08-01 — End: 2020-08-02

## 2020-08-01 MED ORDER — LORAZEPAM 2 MG/ML IJ SOLN
1.0000 mg | INTRAMUSCULAR | Status: DC | PRN
Start: 2020-08-01 — End: 2020-08-02

## 2020-08-01 MED ORDER — PREDNISONE 5 MG (21) PO TBPK: 5.0000 mg | ORAL_TABLET | ORAL | Status: DC

## 2020-08-01 NOTE — ED Provider Notes (Signed)
[facility] Emergency Department Provider Note  Time seen: 9:02 AM  I have reviewed the triage vital signs and the nursing notes.   HISTORY  Chief Complaint Shortness of Breath and Hemoptysis  HPI Shane Leach is a 35 y.o. male with a past medical history of asthma, bronchitis, presents to the emergency department for hemoptysis.   According to the patient he is currently recovering from an episode of bronchitis and is finishing a course of steroids.  Does admit that he has not been taking the steroids every day.  However over the past 3 to 4 days he has noticed when he coughs there is some blood-tinged to his sputum at times.  He has also had some right lower chest/flank pain when coughing or taking a deep breath.  No anterior chest pain.  Does not feel short of breath currently.  No fever.  Past Medical History:  Diagnosis Date  . Asthma   . Bronchitis   . Epilepsy ([place])   . Seizures [facility])     Patient Active Problem List   Diagnosis Date Noted  . Asthma exacerbation 11/17/2016    Past Surgical History:  Procedure Laterality Date  . testicular cancer     right testicle removed    Prior to Admission medications   Medication Sig Start Date End Date Taking? Authorizing Provider  albuterol (PROVENTIL) (2.5 MG/3ML) 0.083% nebulizer solution Take 3 mLs (2.5 mg total) by nebulization every 6 (six) hours as needed for wheezing or shortness of breath. 03/22/20   Delman Kitten, MD  albuterol (VENTOLIN HFA) 108 (90 Base) MCG/ACT inhaler Inhale 2 puffs into the lungs every 6 (six) hours as needed for wheezing or shortness of breath. 01/12/20   Johnn Hai, PA-C  predniSONE (DELTASONE) 10 MG tablet Take 6 tablets  today, on day 2 take 5 tablets, day 3 take 4 tablets, day 4 take 3 tablets, day 5 take  2 tablets and 1 tablet the last day 07/14/20   Johnn Hai, PA-C  ipratropium-albuterol (DUONEB) 0.5-2.5 (3) MG/3ML SOLN Take 3 mLs by nebulization every 4  (four) hours as needed. 12/15/19 01/12/20  Versie Starks, PA-C    Allergies  Allergen Reactions  . Dilantin [Phenytoin Sodium Extended] Other (See Comments)  . Penicillins Other (See Comments)    Has patient had a PCN reaction causing immediate rash, facial/tongue/throat swelling, SOB or lightheadedness with hypotension: Unknown Has patient had a PCN reaction causing severe rash involving mucus membranes or skin necrosis: Unknown Has patient had a PCN reaction that required hospitalization: Unknown Has patient had a PCN reaction occurring within the last 10 years: Unknown If all of the above answers are "NO", then may proceed with Cephalosporin use.  . Tegretol [Carbamazepine] Other (See Comments)    No family history on file.  Social History Social History   Tobacco Use  . Smoking status: Former Smoker    Packs/day: 0.50    Types: Cigarettes    Quit date: 10/12/2014    Years since quitting: 5.8  . Smokeless tobacco: Never Used  Substance Use Topics  . Alcohol use: Not Currently  . Drug use: Not Currently    Types: Marijuana    Review of Systems Constitutional: Negative for fever. Cardiovascular: Right lower posterior chest discomfort with cough or deep inspiration. Respiratory: Negative for shortness of breath.  Positive for cough with occasional blood-tinged sputum. Gastrointestinal: Negative for abdominal pain, vomiting Genitourinary: Negative for urinary compaints Musculoskeletal: Negative for leg pain  or swelling. Neurological: Negative for headache All other ROS negative  ____________________________________________   PHYSICAL EXAM:  VITAL SIGNS: ED Triage Vitals  Enc Vitals Group     BP 08/01/20 0813 (!) 143/100     Pulse Rate 08/01/20 0813 92     Resp 08/01/20 0813 18     Temp 08/01/20 0813 97.9 F (36.6 C)     Temp src --      SpO2 08/01/20 0813 100 %     Weight 08/01/20 0814 240 lb 1.3 oz (108.9 kg)     Height --      Head Circumference --      Peak  Flow --      Pain Score 08/01/20 0814 10     Pain Loc --      Pain Edu? --      Excl. in [place]? --    Constitutional: Alert and oriented. Well appearing and in no distress. Eyes: Normal exam ENT      Head: Normocephalic and atraumatic.      Mouth/Throat: Mucous membranes are moist. Cardiovascular: Normal rate, regular rhythm.  Respiratory: Normal respiratory effort without tachypnea nor retractions. Breath sounds are clear Gastrointestinal: Soft and nontender. No distention.   Musculoskeletal: Nontender with normal range of motion in all extremities. No lower extremity tenderness or edema. Neurologic:  Normal speech and language. No gross focal neurologic deficits Skin:  Skin is warm, dry and intact.  Psychiatric: Mood and affect are normal.  ____________________________________________    EKG  EKG viewed and interpreted by myself shows a normal sinus rhythm at 91 bpm with a narrow QRS, left axis deviation, largely normal intervals with nonspecific ST changes  ____________________________________________    RADIOLOGY  X-rays negative CTA positive for PE.  ____________________________________________   INITIAL IMPRESSION / ASSESSMENT AND PLAN / ED COURSE  Pertinent labs & imaging results that were available during my care of the patient were reviewed by me and considered in my medical decision making (see chart for details).   Patient presents to the emergency department for cough with occasional blood-tinged sputum.  Also states he has been experiencing pain to the posterior lower right chest when he coughs or takes a deep inspiration.  Overall the patient appears well, clear lung sounds.  Does have an occasional cough.  Given the patient's complaints we will proceed with CT imaging of the chest with arterial contrast.  Lab work is largely within normal limits/baseline for the patient.  Patient CT scan of the chest does show right lower lobe pulmonary embolism with an area of  pulmonary infarct and a small pleural effusion.  Given the patient's hemoptysis with PE on CT we will proceed with a heparin infusion and admission to the hospitalist service for further work-up and treatment.  Given the hemoptysis I believe a heparin infusion over a longer acting anticoagulation agent would be preferred during the initial work-up.  Shane Leach was evaluated in Emergency Department on 08/01/2020 for the symptoms described in the history of present illness. He was evaluated in the context of the global COVID-19 pandemic, which necessitated consideration that the patient might be at risk for infection with the SARS-CoV-2 virus that causes COVID-19. Institutional protocols and algorithms that pertain to the evaluation of patients at risk for COVID-19 are in a state of rapid change based on information released by regulatory bodies including the CDC and federal and state organizations. These policies and algorithms were followed during the patient's care in the ED.  CRITICAL CARE Performed by: Harvest Dark   Total critical care time: 30 minutes  Critical care time was exclusive of separately billable procedures and treating other patients.  Critical care was necessary to treat or prevent imminent or life-threatening deterioration.  Critical care was time spent personally by me on the following activities: development of treatment plan with patient and/or surrogate as well as nursing, discussions with consultants, evaluation of patient's response to treatment, examination of patient, obtaining history from patient or surrogate, ordering and performing treatments and interventions, ordering and review of laboratory studies, ordering and review of radiographic studies, pulse oximetry and re-evaluation of patient's condition.  ____________________________________________   FINAL CLINICAL IMPRESSION(S) / ED DIAGNOSES  Hemoptysis Pulmonary embolism   Harvest Dark,  MD 08/01/20 [phone]

## 2020-08-01 NOTE — Progress Notes (Signed)
[place] for Heparin drip Indication: pulmonary embolus  Allergies  Allergen Reactions  . Dilantin [Phenytoin Sodium Extended] Other (See Comments)  . Penicillins Other (See Comments)    Has patient had a PCN reaction causing immediate rash, facial/tongue/throat swelling, SOB or lightheadedness with hypotension: Unknown Has patient had a PCN reaction causing severe rash involving mucus membranes or skin necrosis: Unknown Has patient had a PCN reaction that required hospitalization: Unknown Has patient had a PCN reaction occurring within the last 10 years: Unknown If all of the above answers are "NO", then may proceed with Cephalosporin use.  . Tegretol [Carbamazepine] Other (See Comments)    Patient Measurements: Height: 6' (182.9 cm) Weight: 108.9 kg (240 lb 1.3 oz) IBW/kg (Calculated) : 77.6 Heparin Dosing Weight: 100.6 kg  Vital Signs: Temp: 98 F (36.7 C) (02/22 1533) BP: 136/94 (02/22 1533) Pulse Rate: 80 (02/22 1533)  Labs: Recent Labs    08/01/20 0815 08/01/20 1022 08/01/20 1616  HGB 17.6*  --   --   HCT 52.1*  --   --   PLT 319  --   --   APTT  --  32  --   LABPROT  --  12.5  --   INR  --  1.0  --   HEPARINUNFRC  --   --  0.50  CREATININE 1.23  --   --     Estimated Creatinine Clearance: 109.9 mL/min (by C-G formula based on SCr of 1.23 mg/dL).   Medical History: Past Medical History:  Diagnosis Date  . Asthma   . Bronchitis   . Epilepsy ([place])   . Seizures ([facility])     Medications:  Scheduled:  . folic acid  1 mg Oral Daily  . multivitamin with minerals  1 tablet Oral Daily  . [START ON 08/04/2020] predniSONE  15 mg Oral Once   Followed by  . [START ON 08/05/2020] predniSONE  10 mg Oral Once   Followed by  . [START ON 08/06/2020] predniSONE  5 mg Oral Once  . [START ON 08/02/2020] predniSONE  25 mg Oral Once   Followed by  . [START ON 08/03/2020] predniSONE  20 mg Oral Once  . sodium chloride flush  3 mL  Intravenous Q12H  . thiamine  100 mg Oral Daily   Or  . thiamine  100 mg Intravenous Daily   Infusions:  . sodium chloride    . heparin 1,700 Units/hr (08/01/20 1021)    Assessment: 35 yo M to start Heparin drip for Pulm.Embolism. Hx asthma, seizure, testicular cancer. Presenting with Hemoptysis No anticoag PTA per Med Rec Hgb 17.6  Plt 319   INR 1.0    APTT 32   Goal of Therapy:  Heparin level 0.3-0.7 units/ml Monitor platelets by anticoagulation protocol: Yes   Plan:  Give 7000 units bolus x 1 Start heparin infusion at 1700 units/hr Check anti-Xa level in 6 hours and daily while on heparin Continue to monitor H&H and platelets   2/22 @1616  HL= 0.50. therapeutic x1.  Will continue current rate of 1700 units/hr and check confirmatory level in 6 hrs. F/u CBC in am  Merrill,Kristin A 08/01/2020,5:27 PM

## 2020-08-01 NOTE — H&P (Addendum)
History and Physical    Shane Leach KCL:[id number] DOB: Nov 08, 1985 DOA: 08/01/2020  PCP: Patient, No Pcp Per   Patient coming from: Home  I have personally briefly reviewed patient's old medical records in [place]  Chief Complaint: Hemoptysis/shortness of breath  HPI: Shane Leach is a 35 y.o. male with medical history significant for asthma who presents to the emergency room for evaluation of a 3-day history of shortness of breath mostly with exertion associated with hemoptysis.  Patient states that he has had issues with his lungs for the last couple of weeks and is currently on a steroid taper for bronchitis.  He states that he has quit smoking.  Despite taking the steroid taper his symptoms have not improved.  He also complains of pain in the right posterior lower back area, he rates his pain a 6 x 10 in intensity at its worst and it is nonradiating.  Pain is pleuritic in nature.  He denies having any fever or chills.  He denies having any palpitations or diaphoresis. He has never been diagnosed with COVID-19 viral infection and is unvaccinated. He denies having any abdominal pain, no nausea, no vomiting, no diarrhea, no urinary frequency, no nocturia or dysuria, no blurred vision, no focal deficits, no headache, no dizziness or lightheadedness. Labs show sodium 137, potassium 5.0, chloride 105, bicarb 24, glucose 101, BUN 11, creatinine 1.23, calcium 9.6, white count 9.9, hemoglobin 17.6, hematocrit 52.1, MCV 93, RDW 13.2, platelet count 319, PT 12.5, INR 1.0 CT angiogram of the chest shows small pulmonary emboli in the right lower lobe branches with associated posterior basal segments lower lobe pulmonary infarct and a small layering right pleural effusion.  The bilateral pulmonary arteries appear patent.  No central or saddle embolus. Twelve-lead EKG reviewed by me shows normal sinus rhythm with a left anterior fascicular block    ED Course: Patient is a 35 year old  male who presents to the ER for evaluation of exertional shortness of breath, hemoptysis and pleuritic pain in the right posterior lower back area.  CT angiogram shows a small pulmonary emboli in the right lower lobe branches with associated pulmonary infarct.  Patient was started on a heparin drip and will be referred to observation status for further evaluation.   Review of Systems: As per HPI otherwise all other systems reviewed and negative.    Past Medical History:  Diagnosis Date  . Asthma   . Bronchitis   . Epilepsy ([place])   . Seizures ([place])     Past Surgical History:  Procedure Laterality Date  . testicular cancer     right testicle removed     reports that he quit smoking about 5 years ago. His smoking use included cigarettes. He smoked 0.50 packs per day. He has never used smokeless tobacco. He reports previous alcohol use. He reports previous drug use. Drug: Marijuana.  Allergies  Allergen Reactions  . Dilantin [Phenytoin Sodium Extended] Other (See Comments)  . Penicillins Other (See Comments)    Has patient had a PCN reaction causing immediate rash, facial/tongue/throat swelling, SOB or lightheadedness with hypotension: Unknown Has patient had a PCN reaction causing severe rash involving mucus membranes or skin necrosis: Unknown Has patient had a PCN reaction that required hospitalization: Unknown Has patient had a PCN reaction occurring within the last 10 years: Unknown If all of the above answers are "NO", then may proceed with Cephalosporin use.  . Tegretol [Carbamazepine] Other (See Comments)    Family History  Problem Relation Age of Onset  . Stroke Mother   . Deep vein thrombosis Mother       Prior to Admission medications   Medication Sig Start Date End Date Taking? Authorizing Provider  albuterol (PROVENTIL) (2.5 MG/3ML) 0.083% nebulizer solution Take 3 mLs (2.5 mg total) by nebulization every 6 (six) hours as needed for wheezing or shortness of breath.  03/22/20   Delman Kitten, MD  albuterol (VENTOLIN HFA) 108 (90 Base) MCG/ACT inhaler Inhale 2 puffs into the lungs every 6 (six) hours as needed for wheezing or shortness of breath. 01/12/20   Johnn Hai, PA-C  predniSONE (DELTASONE) 10 MG tablet Take 6 tablets  today, on day 2 take 5 tablets, day 3 take 4 tablets, day 4 take 3 tablets, day 5 take  2 tablets and 1 tablet the last day 07/14/20   Johnn Hai, PA-C  ipratropium-albuterol (DUONEB) 0.5-2.5 (3) MG/3ML SOLN Take 3 mLs by nebulization every 4 (four) hours as needed. 12/15/19 01/12/20  Versie Starks, PA-C    Physical Exam: Vitals:   08/01/20 0814 08/01/20 0949 08/01/20 1000 08/01/20 1030  BP:  123/88 (!) 129/96 116/75  Pulse:  90 88 (!) 102  Resp:  20  17  Temp:      SpO2:  98% 97% 99%  Weight: 108.9 kg     Height: 6' (1.829 m)        Vitals:   08/01/20 0814 08/01/20 0949 08/01/20 1000 08/01/20 1030  BP:  123/88 (!) 129/96 116/75  Pulse:  90 88 (!) 102  Resp:  20  17  Temp:      SpO2:  98% 97% 99%  Weight: 108.9 kg     Height: 6' (1.829 m)         Constitutional: Alert and oriented x 3 . Not in any apparent distress HEENT:      Head: Normocephalic and atraumatic.         Eyes: PERLA, EOMI, Conjunctivae are normal. Sclera is non-icteric.       Mouth/Throat: Mucous membranes are moist.       Neck: Supple with no signs of meningismus. Cardiovascular: Regular rate and rhythm. No murmurs, gallops, or rubs. 2+ symmetrical distal pulses are present . No JVD. No LE edema Respiratory: Respiratory effort normal .Lungs sounds clear bilaterally.  Scattered wheezes, crackles, or rhonchi.  Gastrointestinal: Soft, non tender, and non distended with positive bowel sounds.  Genitourinary: No CVA tenderness. Musculoskeletal: Nontender with normal range of motion in all extremities. No cyanosis, or erythema of extremities. Neurologic:  Face is symmetric. Moving all extremities. No gross focal neurologic deficits  Skin: Skin is  warm, dry.  No rash or ulcers Psychiatric: Mood and affect are normal   Labs on Admission: I have personally reviewed following labs and imaging studies  CBC: Recent Labs  Lab 08/01/20 0815  WBC 9.9  NEUTROABS 7.5  HGB 17.6*  HCT 52.1*  MCV 93.0  PLT 671   Basic Metabolic Panel: Recent Labs  Lab 08/01/20 0815  NA 137  K 5.0  CL 105  CO2 24  GLUCOSE 101*  BUN 11  CREATININE 1.23  CALCIUM 9.6   GFR: Estimated Creatinine Clearance: 109.9 mL/min (by C-G formula based on SCr of 1.23 mg/dL). Liver Function Tests: No results for input(s): AST, ALT, ALKPHOS, BILITOT, PROT, ALBUMIN in the last 168 hours. No results for input(s): LIPASE, AMYLASE in the last 168 hours. No results for input(s): AMMONIA in the last 168  hours. Coagulation Profile: Recent Labs  Lab 08/01/20 1022  INR 1.0   Cardiac Enzymes: No results for input(s): CKTOTAL, CKMB, CKMBINDEX, TROPONINI in the last 168 hours. BNP (last 3 results) No results for input(s): PROBNP in the last 8760 hours. HbA1C: No results for input(s): HGBA1C in the last 72 hours. CBG: No results for input(s): GLUCAP in the last 168 hours. Lipid Profile: No results for input(s): CHOL, HDL, LDLCALC, TRIG, CHOLHDL, LDLDIRECT in the last 72 hours. Thyroid Function Tests: No results for input(s): TSH, T4TOTAL, FREET4, T3FREE, THYROIDAB in the last 72 hours. Anemia Panel: No results for input(s): VITAMINB12, FOLATE, FERRITIN, TIBC, IRON, RETICCTPCT in the last 72 hours. Urine analysis:    Component Value Date/Time   COLORURINE AMBER (A) 08/31/2019 0424   APPEARANCEUR CLEAR (A) 08/31/2019 0424   LABSPEC 1.028 08/31/2019 0424   PHURINE 6.0 08/31/2019 0424   GLUCOSEU NEGATIVE 08/31/2019 0424   HGBUR MODERATE (A) 08/31/2019 0424   BILIRUBINUR NEGATIVE 08/31/2019 0424   KETONESUR 20 (A) 08/31/2019 0424   PROTEINUR 30 (A) 08/31/2019 0424   NITRITE NEGATIVE 08/31/2019 0424   LEUKOCYTESUR NEGATIVE 08/31/2019 0424    Radiological  Exams on Admission: DG Chest 2 View  Result Date: 08/01/2020 CLINICAL DATA:  Shortness of breath.  Hemoptysis. EXAM: CHEST - 2 VIEW COMPARISON:  Chest x-ray 03/22/2020. FINDINGS: Mediastinum and hilar structures normal. Heart size normal. No focal infiltrate. No pleural effusion or pneumothorax. No acute bony abnormality. IMPRESSION: No acute cardiopulmonary disease. Electronically Signed   By: Marcello Moores  Register   On: 08/01/2020 08:41   CT Angio Chest PE W and/or Wo Contrast  Addendum Date: 08/01/2020   ADDENDUM REPORT: 08/01/2020 10:10 ADDENDUM: Study discussed by telephone with Dr. Lennette Bihari PADUCHOWSKI on 08/01/2020 at 0946 hours. Electronically Signed   By: Genevie Ann M.D.   On: 08/01/2020 10:10   Result Date: 08/01/2020 CLINICAL DATA:  35 year old male with right side pleuritic chest pain for 4 days. Mild hemoptysis. EXAM: CT ANGIOGRAPHY CHEST WITH CONTRAST TECHNIQUE: Multidetector CT imaging of the chest was performed using the standard protocol during bolus administration of intravenous contrast. Multiplanar CT image reconstructions and MIPs were obtained to evaluate the vascular anatomy. CONTRAST:  79mL OMNIPAQUE IOHEXOL 350 MG/ML SOLN COMPARISON:  Chest radiographs 0827 hours today and earlier. FINDINGS: Cardiovascular: Good contrast bolus timing in the pulmonary arterial tree. Mild respiratory motion. And there does appear to be abnormal filling defects in the right lower lobe posterior basal segment branches (series 5, image 169, 183) which lead to a peripheral wedge-shaped area of abnormal right lung opacity further described below. However, no other pulmonary artery filling defect is identified. No cardiomegaly or pericardial effusion. No calcified coronary artery atherosclerosis is evident. Negative visible aorta. Mediastinum/Nodes: Negative.  No lymphadenopathy. Lungs/Pleura: Small layering right pleural effusion. Wedge-shaped and peripheral indistinct lung opacity in the posterior basal segment of  the right lower lobe encompassing an area of about 5 cm. The opacity tracks into the posterior right costophrenic angle. But elsewhere the lungs appear clear. Mild respiratory motion. Major airways are patent with trace retained secretions at the carina and left mainstem bronchus. Upper Abdomen: Negative visible liver, spleen, pancreas, adrenal glands, kidneys and bowel in the upper abdomen. Musculoskeletal: Negative. Review of the MIP images confirms the above findings. IMPRESSION: 1. Positive for small pulmonary emboli in right lower lobe branches with associated posterior basal segment lower lobe pulmonary infarct and a small layering right pleural effusion. Other bilateral pulmonary arteries appear patent. No central or  saddle embolus. 2. Otherwise negative CT appearance of the chest. Electronically Signed: By: Genevie Ann M.D. On: 08/01/2020 09:41     Assessment/Plan Principal Problem:   Pulmonary embolism ([facility]CC) Active Problems:   Asthma   Alcohol dependence ([place])     Acute pulmonary embolism Patient presents for evaluation of shortness of breath, pleuritic chest pain and hemoptysis. He had a CT angiogram which showed a small pulmonary emboli in the right lower lobe branches with a basal segment lower lobe pulmonary infarct. Continue heparin drip initiated in the ER Obtain lower extremity venous Doppler Obtain 2D echocardiogram to rule out RV strain and assess LVEF   Asthma Complete steroid taper (was initiated prior to this hospitalization) Continue as needed bronchodilator therapy   Alcohol dependence Patient admits to daily alcohol use and admits to having symptoms of alcohol withdrawal when he does not drink. We will place patient on CIWA protocol and administer lorazepam for CIWA score of 8 or greater. Place patient on MVI, thiamine and folic acid   DVT prophylaxis: Heparin Code Status: full code Family Communication: Greater than 50% of time was spent discussing plan of care  with patient at the bedside.  All questions and concerns have been addressed.  He verbalizes understanding and agrees with the plan. Disposition Plan: Back to previous home environment Consults called: none Status: Observation    Khaleb Broz MD [facility]     08/01/2020, 12:43 PM

## 2020-08-01 NOTE — Progress Notes (Addendum)
ANTICOAGULATION CONSULT NOTE - Initial Consult  Pharmacy Consult for Heparin drip Indication: pulmonary embolus  Allergies  Allergen Reactions  . Dilantin [Phenytoin Sodium Extended] Other (See Comments)  . Penicillins Other (See Comments)    Has patient had a PCN reaction causing immediate rash, facial/tongue/throat swelling, SOB or lightheadedness with hypotension: Unknown Has patient had a PCN reaction causing severe rash involving mucus membranes or skin necrosis: Unknown Has patient had a PCN reaction that required hospitalization: Unknown Has patient had a PCN reaction occurring within the last 10 years: Unknown If all of the above answers are "NO", then may proceed with Cephalosporin use.  . Tegretol [Carbamazepine] Other (See Comments)    Patient Measurements: Height: 6' (182.9 cm) Weight: 108.9 kg (240 lb 1.3 oz) IBW/kg (Calculated) : 77.6 Heparin Dosing Weight: 100.6 kg  Vital Signs: Temp: 97.9 F (36.6 C) (02/22 0813) BP: 123/88 (02/22 0949) Pulse Rate: 90 (02/22 0949)  Labs: Recent Labs    08/01/20 0815  HGB 17.6*  HCT 52.1*  PLT 319  CREATININE 1.23    Estimated Creatinine Clearance: 109.9 mL/min (by C-G formula based on SCr of 1.23 mg/dL).   Medical History: Past Medical History:  Diagnosis Date  . Asthma   . Bronchitis   . Epilepsy ([place])   . Seizures ([place])     Medications:  Scheduled:  . heparin  7,000 Units Intravenous Once   Infusions:  . heparin      Assessment: 35 yo M to start Heparin drip for Pulm.Embolism. Hx asthma, seizure, testicular cancer. Presenting with Hemoptysis No anticoag PTA per Med Rec Hgb 17.6  Plt 319   INR 1.0    APTT 32   Goal of Therapy:  Heparin level 0.3-0.7 units/ml Monitor platelets by anticoagulation protocol: Yes   Plan:  Give 7000 units bolus x 1 Start heparin infusion at 1700 units/hr Check anti-Xa level in 6 hours and daily while on heparin Continue to monitor H&H and platelets  Shane Leach  A 08/01/2020,10:08 AM

## 2020-08-01 NOTE — Progress Notes (Signed)
[facility] for Heparin drip Indication: pulmonary embolus  Allergies  Allergen Reactions  . Dilantin [Phenytoin Sodium Extended] Other (See Comments)  . Penicillins Other (See Comments)    Has patient had a PCN reaction causing immediate rash, facial/tongue/throat swelling, SOB or lightheadedness with hypotension: Unknown Has patient had a PCN reaction causing severe rash involving mucus membranes or skin necrosis: Unknown Has patient had a PCN reaction that required hospitalization: Unknown Has patient had a PCN reaction occurring within the last 10 years: Unknown If all of the above answers are "NO", then may proceed with Cephalosporin use.  . Tegretol [Carbamazepine] Other (See Comments)    Patient Measurements: Height: 6' (182.9 cm) Weight: 108.9 kg (240 lb 1.3 oz) IBW/kg (Calculated) : 77.6 Heparin Dosing Weight: 100.6 kg  Vital Signs: Temp: 98.4 F (36.9 C) (02/22 2009) Temp Source: Oral (02/22 2009) BP: 129/90 (02/22 2009) Pulse Rate: 62 (02/22 2009)  Labs: Recent Labs    08/01/20 0815 08/01/20 1022 08/01/20 1616 08/01/20 2154  HGB 17.6*  --   --   --   HCT 52.1*  --   --   --   PLT 319  --   --   --   APTT  --  32  --   --   LABPROT  --  12.5  --   --   INR  --  1.0  --   --   HEPARINUNFRC  --   --  0.50 0.46  CREATININE 1.23  --   --   --     Estimated Creatinine Clearance: 109.9 mL/min (by C-G formula based on SCr of 1.23 mg/dL).   Medical History: Past Medical History:  Diagnosis Date  . Asthma   . Bronchitis   . Epilepsy ([place])   . Seizures ([facility])     Medications:  Scheduled:  . folic acid  1 mg Oral Daily  . ipratropium-albuterol  3 mL Nebulization Q6H  . multivitamin with minerals  1 tablet Oral Daily  . [START ON 08/04/2020] predniSONE  15 mg Oral Once   Followed by  . [START ON 08/05/2020] predniSONE  10 mg Oral Once   Followed by  . [START ON 08/06/2020] predniSONE  5 mg Oral Once  . [START ON  08/02/2020] predniSONE  25 mg Oral Once   Followed by  . [START ON 08/03/2020] predniSONE  20 mg Oral Once  . sodium chloride flush  3 mL Intravenous Q12H  . thiamine  100 mg Oral Daily   Or  . thiamine  100 mg Intravenous Daily   Infusions:  . sodium chloride    . heparin 1,700 Units/hr (08/01/20 1730)    Assessment: 35 yo M to start Heparin drip for Pulm.Embolism. Hx asthma, seizure, testicular cancer. Presenting with Hemoptysis No anticoag PTA per Med Rec Hgb 17.6  Plt 319   INR 1.0    APTT 32   Goal of Therapy:  Heparin level 0.3-0.7 units/ml Monitor platelets by anticoagulation protocol: Yes   Plan:  Give 7000 units bolus x 1 Start heparin infusion at 1700 units/hr Check anti-Xa level in 6 hours and daily while on heparin Continue to monitor H&H and platelets   2/22 @1616  HL= 0.50. therapeutic x1. 2/22 @ 2154 HL = 0.46, therapeutic x 2  Will continue current rate of 1700 units/hr and check HL daily while on Heparin. F/u CBC in am  Renda Rolls, PharmD, [facility] 08/01/2020 11:56 PM

## 2020-08-01 NOTE — ED Triage Notes (Signed)
Pt in w/sob, hemoptysis x 2-3 days. States hx of asthma, chronic bronchitis. Was seen for similar earlier this month, and is currently finishing Prednisone course. Pt states he always has productive cough, but recent days he has noticed small amount of blood tinge in sputum, particularly in the morning. Took albuterol neb PTA. Also c/o R lateral rib pain

## 2020-08-02 ENCOUNTER — Other Ambulatory Visit: Payer: Self-pay | Admitting: Internal Medicine

## 2020-08-02 LAB — BASIC METABOLIC PANEL WITH GFR
Anion gap: 9 (ref 5–15)
BUN: 10 mg/dL (ref 6–20)
CO2: 21 mmol/L — ABNORMAL LOW (ref 22–32)
Calcium: 9.2 mg/dL (ref 8.9–10.3)
Chloride: 105 mmol/L (ref 98–111)
Creatinine, Ser: 0.98 mg/dL (ref 0.61–1.24)
GFR, Estimated: >60 mL/min (ref >60)
Glucose, Bld: 117 mg/dL — ABNORMAL HIGH (ref 70–99)
Potassium: 4.3 mmol/L (ref 3.5–5.1)
Sodium: 135 mmol/L (ref 135–145)

## 2020-08-02 LAB — CBC
HCT: 49.4 % (ref 39.0–52.0)
Hemoglobin: 16.8 g/dL (ref 13.0–17.0)
MCH: 31.4 pg (ref 26.0–34.0)
MCHC: 34 g/dL (ref 30.0–36.0)
MCV: 92.3 fL (ref 80.0–100.0)
Platelets: 305 K/uL (ref 150–400)
RBC: 5.35 MIL/uL (ref 4.22–5.81)
RDW: 12.6 % (ref 11.5–15.5)
WBC: 8.7 K/uL (ref 4.0–10.5)
nRBC: 0 % (ref 0.0–0.2)

## 2020-08-02 LAB — ECHOCARDIOGRAM COMPLETE
Area-P 1/2: 4.89 cm2
Height: 72 in
S' Lateral: 2.92 cm
Weight: 3841.3 oz

## 2020-08-02 LAB — HEPARIN LEVEL (UNFRACTIONATED): Heparin Unfractionated: 0.51 [IU]/mL (ref 0.30–0.70)

## 2020-08-02 MED ORDER — LEVETIRACETAM 1000 MG PO TABS: 1000.0000 mg | ORAL_TABLET | Freq: Two times a day (BID) | ORAL | 3 refills | Status: DC

## 2020-08-02 MED ORDER — APIXABAN 5 MG PO TABS: 10.0000 mg | ORAL_TABLET | Freq: Two times a day (BID) | ORAL | 3 refills | Status: DC

## 2020-08-02 MED ORDER — ALBUTEROL SULFATE HFA 108 (90 BASE) MCG/ACT IN AERS
2.0000 | INHALATION_SPRAY | Freq: Four times a day (QID) | RESPIRATORY_TRACT | 2 refills | Status: DC | PRN
Start: 2020-08-02 — End: 2020-08-02

## 2020-08-02 MED ORDER — LEVETIRACETAM 500 MG PO TABS
1000.0000 mg | ORAL_TABLET | Freq: Two times a day (BID) | ORAL | Status: DC
  Administered 2020-08-02: 1000 mg via ORAL
  Filled 2020-08-02: qty 2

## 2020-08-02 MED ORDER — ALBUTEROL SULFATE (2.5 MG/3ML) 0.083% IN NEBU: 2.5000 mg | INHALATION_SOLUTION | Freq: Four times a day (QID) | RESPIRATORY_TRACT | 3 refills | Status: DC | PRN

## 2020-08-02 MED ORDER — APIXABAN 5 MG PO TABS
10.0000 mg | ORAL_TABLET | Freq: Two times a day (BID) | ORAL | Status: DC
  Administered 2020-08-02: 10 mg via ORAL
  Filled 2020-08-02: qty 2

## 2020-08-02 MED ORDER — HYDROCODONE-ACETAMINOPHEN 5-325 MG PO TABS: 1.0000 | ORAL_TABLET | Freq: Three times a day (TID) | ORAL | 0 refills | Status: DC | PRN

## 2020-08-02 MED ORDER — APIXABAN 5 MG PO TABS: 5.0000 mg | ORAL_TABLET | Freq: Two times a day (BID) | ORAL | Status: DC

## 2020-08-02 NOTE — Progress Notes (Signed)
MD ordered patient to be discharged home.  Discharge instructions were reviewed with the patient and he voiced understanding.  Follow-up appointment was made.  Prescriptions  to medication management.  IV was removed with catheter intact.  All patients questions were answered.  Patient left via wheelchair escorted by auxillary.

## 2020-08-02 NOTE — Discharge Summary (Addendum)
[facility] [place] [facility] [place] NAME: Shane Leach    MR#:  [id number]  DATE OF BIRTH:  1986-05-22  DATE OF ADMISSION:  08/01/2020 ADMITTING PHYSICIAN: Collier Bullock, MD  DATE OF DISCHARGE: 08/02/2020 PRIMARY CARE PHYSICIAN: Marguerita Merles, MD    ADMISSION DIAGNOSIS:  Shortness of breath [R06.02] Pulmonary embolism ([place]) [I26.99] DVT (deep venous thrombosis) ([facility]) [I82.409] Abnormal chest CT [R93.89] Acute pulmonary embolism without acute cor pulmonale, unspecified pulmonary embolism type ([place]) [I26.99] Other acute pulmonary embolism, unspecified whether acute cor pulmonale present ([place]) [I26.99]  DISCHARGE DIAGNOSIS:  Acute right subsegmental PE--unprovoked  SECONDARY DIAGNOSIS:   Past Medical History:  Diagnosis Date  . Asthma   . Bronchitis   . Epilepsy ([place])   . Seizures [facility])     HOSPITAL COURSE:   Shane Leach is a 35 y.o. male with medical history significant for asthma who presents to the emergency room for evaluation of a 3-day history of shortness of breath mostly with exertion associated with hemoptysis.  Patient states that he has had issues with his lungs for the last couple of weeks and is currently on a steroid taper for bronchitis.He also complains of pain in the right posterior lower back area, he rates his pain a 6 x 10 in intensity at its worst and it is nonradiating.  Pain is pleuritic in nature.  Acute pulmonary embolism--right subsegmental branch unprovoked --Patient presents for evaluation of shortness of breath, pleuritic chest pain and hemoptysis -CT angiogram which showed a small pulmonary emboli in the right lower lobe branches with a basal segment lower lobe pulmonary infarct. --was started on IV heparin drip initiated in the ER--change to oral eliquis. --sats 100% on RA, hgb stable -- lower extremity venous Doppler--negative. -- 2D echocardiogram completed. -- Patient will be referred outpatient to  oncology for workup on coagulopathy. Dr. Rogue Bussing send the referral.  Asthma, mild persistent --Complete steroid taper (was initiated prior to this hospitalization) --Continue as needed bronchodilator therapy  Alcohol dependence Patient admits to daily alcohol use (2 small cans of beer) --no s/o WD  H/o epilepsy --pt tells me he takes Keppra 1000 mg bid  Overall hemodynamically stable. Will discharge patient to home. He has been set up with medication management clinic for his medications and [facility] appointment has been obtained by TOC.   DVT prophylaxis: eliqus Code Status: full code Family Communication: Greater than 50% of time was spent discussing plan of care with patient at the bedside.  All questions and concerns have been addressed.  He verbalizes understanding and agrees with the plan. Disposition Plan: Back to previous home environment Consults called: none Status: Observation  CONSULTS OBTAINED:    DRUG ALLERGIES:   Allergies  Allergen Reactions  . Dilantin [Phenytoin Sodium Extended] Other (See Comments)  . Penicillins Other (See Comments)    Has patient had a PCN reaction causing immediate rash, facial/tongue/throat swelling, SOB or lightheadedness with hypotension: Unknown Has patient had a PCN reaction causing severe rash involving mucus membranes or skin necrosis: Unknown Has patient had a PCN reaction that required hospitalization: Unknown Has patient had a PCN reaction occurring within the last 10 years: Unknown If all of the above answers are "NO", then may proceed with Cephalosporin use.  . Tegretol [Carbamazepine] Other (See Comments)    DISCHARGE MEDICATIONS:   Allergies as of 08/02/2020      Reactions   Dilantin [phenytoin Sodium Extended] Other (See Comments)   Penicillins Other (See  Comments)   Has patient had a PCN reaction causing immediate rash, facial/tongue/throat swelling, SOB or lightheadedness with hypotension: Unknown Has  patient had a PCN reaction causing severe rash involving mucus membranes or skin necrosis: Unknown Has patient had a PCN reaction that required hospitalization: Unknown Has patient had a PCN reaction occurring within the last 10 years: Unknown If all of the above answers are "NO", then may proceed with Cephalosporin use.   Tegretol [carbamazepine] Other (See Comments)      Medication List    TAKE these medications   albuterol (2.5 MG/3ML) 0.083% nebulizer solution Commonly known as: PROVENTIL Take 3 mLs (2.5 mg total) by nebulization every 6 (six) hours as needed for wheezing or shortness of breath.   albuterol 108 (90 Base) MCG/ACT inhaler Commonly known as: VENTOLIN HFA Inhale 2 puffs into the lungs every 6 (six) hours as needed for wheezing or shortness of breath.   apixaban 5 MG Tabs tablet Commonly known as: ELIQUIS Take 2 tablets (10 mg total) by mouth 2 (two) times daily. And from 08/09/20 start taking 5 mg bid   HYDROcodone-acetaminophen 5-325 MG tablet Commonly known as: NORCO/VICODIN Take 1-2 tablets by mouth every 8 (eight) hours as needed for moderate pain or severe pain.   levETIRAcetam 1000 MG tablet Commonly known as: KEPPRA Take 1 tablet (1,000 mg total) by mouth 2 (two) times daily.   predniSONE 10 MG tablet Commonly known as: DELTASONE Take 6 tablets  today, on day 2 take 5 tablets, day 3 take 4 tablets, day 4 take 3 tablets, day 5 take  2 tablets and 1 tablet the last day       If you experience worsening of your admission symptoms, develop shortness of breath, life threatening emergency, suicidal or homicidal thoughts you must seek medical attention immediately by calling 911 or calling your MD immediately  if symptoms less severe.  You Must read complete instructions/literature along with all the possible adverse reactions/side effects for all the Medicines you take and that have been prescribed to you. Take any new Medicines after you have completely  understood and accept all the possible adverse reactions/side effects.   Please note  You were cared for by a hospitalist during your hospital stay. If you have any questions about your discharge medications or the care you received while you were in the hospital after you are discharged, you can call the unit and asked to speak with the hospitalist on call if the hospitalist that took care of you is not available. Once you are discharged, your primary care physician will handle any further medical issues. Please note that NO REFILLS for any discharge medications will be authorized once you are discharged, as it is imperative that you return to your primary care physician (or establish a relationship with a primary care physician if you do not have one) for your aftercare needs so that they can reassess your need for medications and monitor your lab values. Today   SUBJECTIVE   No new complaints feels better. No more coughing of blood. No wheezing.  VITAL SIGNS:  Blood pressure 134/89, pulse 84, temperature 97.8 F (36.6 C), resp. rate 16, height 6' (1.829 m), weight 108.9 kg, SpO2 99 %.  I/O:    Intake/Output Summary (Last 24 hours) at 08/02/2020 1211 Last data filed at 08/02/2020 0300 Gross per 24 hour  Intake 343.73 ml  Output -  Net 343.73 ml    PHYSICAL EXAMINATION:  GENERAL:  35 y.o.-year-old patient lying  in the bed with no acute distress. Obese LUNGS: Normal breath sounds bilaterally, no wheezing, rales,rhonchi or crepitation. No use of accessory muscles of respiration.  CARDIOVASCULAR: S1, S2 normal. No murmurs, rubs, or gallops.  ABDOMEN: Soft, non-tender, non-distended. Bowel sounds present. No organomegaly or mass.  EXTREMITIES: No pedal edema, cyanosis, or clubbing.  NEUROLOGIC: Cranial nerves II through XII are intact. Muscle strength 5/5 in all extremities. Sensation intact. Gait not checked.  PSYCHIATRIC: The patient is alert and oriented x 3.  SKIN: No obvious rash,  lesion, or ulcer. Tattoo both upper extremities  DATA REVIEW:   CBC  Recent Labs  Lab 08/02/20 0453  WBC 8.7  HGB 16.8  HCT 49.4  PLT 305    Chemistries  Recent Labs  Lab 08/01/20 0815 08/02/20 0453  NA 137 135  K 5.0 4.3  CL 105 105  CO2 24 21*  GLUCOSE 101* 117*  BUN 11 10  CREATININE 1.23 0.98  CALCIUM 9.6 9.2  MG 2.1  --     Microbiology Results   Recent Results (from the past 240 hour(s))  Resp Panel by RT-PCR (Flu A&B, Covid) Nasopharyngeal Swab     Status: None   Collection Time: 08/01/20 12:32 PM   Specimen: Nasopharyngeal Swab; Nasopharyngeal(NP) swabs in vial transport medium  Result Value Ref Range Status   SARS Coronavirus 2 by RT PCR NEGATIVE NEGATIVE Final    Comment: (NOTE) SARS-CoV-2 target nucleic acids are NOT DETECTED.  The SARS-CoV-2 RNA is generally detectable in upper respiratory specimens during the acute phase of infection. The lowest concentration of SARS-CoV-2 viral copies this assay can detect is 138 copies/mL. A negative result does not preclude SARS-Cov-2 infection and should not be used as the sole basis for treatment or other patient management decisions. A negative result may occur with  improper specimen collection/handling, submission of specimen other than nasopharyngeal swab, presence of viral mutation(s) within the areas targeted by this assay, and inadequate number of viral copies(<138 copies/mL). A negative result must be combined with clinical observations, patient history, and epidemiological information. The expected result is Negative.  Fact Sheet for Patients:  EntrepreneurPulse.com.au  Fact Sheet for Healthcare Providers:  IncredibleEmployment.be  This test is no t yet approved or cleared by the [place] FDA and  has been authorized for detection and/or diagnosis of SARS-CoV-2 by FDA under an Emergency Use Authorization (EUA). This EUA will remain  in effect (meaning  this test can be used) for the duration of the COVID-19 declaration under Section 564(b)(1) of the Act, 21 U.S.C.section 360bbb-3(b)(1), unless the authorization is terminated  or revoked sooner.       Influenza A by PCR NEGATIVE NEGATIVE Final   Influenza B by PCR NEGATIVE NEGATIVE Final    Comment: (NOTE) The Xpert Xpress SARS-CoV-2/FLU/RSV plus assay is intended as an aid in the diagnosis of influenza from Nasopharyngeal swab specimens and should not be used as a sole basis for treatment. Nasal washings and aspirates are unacceptable for Xpert Xpress SARS-CoV-2/FLU/RSV testing.  Fact Sheet for Patients: EntrepreneurPulse.com.au  Fact Sheet for Healthcare Providers: IncredibleEmployment.be  This test is not yet approved or cleared by the [place] FDA and has been authorized for detection and/or diagnosis of SARS-CoV-2 by FDA under an Emergency Use Authorization (EUA). This EUA will remain in effect (meaning this test can be used) for the duration of the COVID-19 declaration under Section 564(b)(1) of the Act, 21 U.S.C. section 360bbb-3(b)(1), unless the authorization is terminated or revoked.  Performed  at [facility], [address] [place], [place] [address]     RADIOLOGY:  DG Chest 2 View  Result Date: 08/01/2020 CLINICAL DATA:  Shortness of breath.  Hemoptysis. EXAM: CHEST - 2 VIEW COMPARISON:  Chest x-ray 03/22/2020. FINDINGS: Mediastinum and hilar structures normal. Heart size normal. No focal infiltrate. No pleural effusion or pneumothorax. No acute bony abnormality. IMPRESSION: No acute cardiopulmonary disease. Electronically Signed   By: Marcello Moores  Register   On: 08/01/2020 08:41   CT Angio Chest PE W and/or Wo Contrast  Addendum Date: 08/01/2020   ADDENDUM REPORT: 08/01/2020 10:10 ADDENDUM: Study discussed by telephone with Dr. Lennette Bihari PADUCHOWSKI on 08/01/2020 at 0946 hours. Electronically Signed   By: Genevie Ann M.D.    On: 08/01/2020 10:10   Result Date: 08/01/2020 CLINICAL DATA:  35 year old male with right side pleuritic chest pain for 4 days. Mild hemoptysis. EXAM: CT ANGIOGRAPHY CHEST WITH CONTRAST TECHNIQUE: Multidetector CT imaging of the chest was performed using the standard protocol during bolus administration of intravenous contrast. Multiplanar CT image reconstructions and MIPs were obtained to evaluate the vascular anatomy. CONTRAST:  74mL OMNIPAQUE IOHEXOL 350 MG/ML SOLN COMPARISON:  Chest radiographs 0827 hours today and earlier. FINDINGS: Cardiovascular: Good contrast bolus timing in the pulmonary arterial tree. Mild respiratory motion. And there does appear to be abnormal filling defects in the right lower lobe posterior basal segment branches (series 5, image 169, 183) which lead to a peripheral wedge-shaped area of abnormal right lung opacity further described below. However, no other pulmonary artery filling defect is identified. No cardiomegaly or pericardial effusion. No calcified coronary artery atherosclerosis is evident. Negative visible aorta. Mediastinum/Nodes: Negative.  No lymphadenopathy. Lungs/Pleura: Small layering right pleural effusion. Wedge-shaped and peripheral indistinct lung opacity in the posterior basal segment of the right lower lobe encompassing an area of about 5 cm. The opacity tracks into the posterior right costophrenic angle. But elsewhere the lungs appear clear. Mild respiratory motion. Major airways are patent with trace retained secretions at the carina and left mainstem bronchus. Upper Abdomen: Negative visible liver, spleen, pancreas, adrenal glands, kidneys and bowel in the upper abdomen. Musculoskeletal: Negative. Review of the MIP images confirms the above findings. IMPRESSION: 1. Positive for small pulmonary emboli in right lower lobe branches with associated posterior basal segment lower lobe pulmonary infarct and a small layering right pleural effusion. Other bilateral  pulmonary arteries appear patent. No central or saddle embolus. 2. Otherwise negative CT appearance of the chest. Electronically Signed: By: Genevie Ann M.D. On: 08/01/2020 09:41   US Venous Img Lower Bilateral (DVT)  Result Date: 08/01/2020 CLINICAL DATA:  Pulmonary emboli EXAM: BILATERAL LOWER EXTREMITY VENOUS DUPLEX ULTRASOUND TECHNIQUE: Gray-scale sonography with graded compression, as well as color Doppler and duplex ultrasound were performed to evaluate the lower extremity deep venous systems from the level of the common femoral vein and including the common femoral, femoral, profunda femoral, popliteal and calf veins including the posterior tibial, peroneal and gastrocnemius veins when visible. The superficial great saphenous vein was also interrogated. Spectral Doppler was utilized to evaluate flow at rest and with distal augmentation maneuvers in the common femoral, femoral and popliteal veins. COMPARISON:  None. FINDINGS: RIGHT LOWER EXTREMITY Common Femoral Vein: No evidence of thrombus. Normal compressibility, respiratory phasicity and response to augmentation. Saphenofemoral Junction: No evidence of thrombus. Normal compressibility and flow on color Doppler imaging. Profunda Femoral Vein: No evidence of thrombus. Normal compressibility and flow on color Doppler imaging. Femoral Vein: No evidence of thrombus.  Normal compressibility, respiratory phasicity and response to augmentation. Popliteal Vein: No evidence of thrombus. Normal compressibility, respiratory phasicity and response to augmentation. Calf Veins: No evidence of thrombus. Normal compressibility and flow on color Doppler imaging. Superficial Great Saphenous Vein: No evidence of thrombus. Normal compressibility. Venous Reflux:  None. Other Findings:  None. LEFT LOWER EXTREMITY Common Femoral Vein: No evidence of thrombus. Normal compressibility, respiratory phasicity and response to augmentation. Saphenofemoral Junction: No evidence of  thrombus. Normal compressibility and flow on color Doppler imaging. Profunda Femoral Vein: No evidence of thrombus. Normal compressibility and flow on color Doppler imaging. Femoral Vein: No evidence of thrombus. Normal compressibility, respiratory phasicity and response to augmentation. Popliteal Vein: No evidence of thrombus. Normal compressibility, respiratory phasicity and response to augmentation. Calf Veins: No evidence of thrombus. Normal compressibility and flow on color Doppler imaging. Superficial Great Saphenous Vein: No evidence of thrombus. Normal compressibility. Venous Reflux:  None. Other Findings:  None. IMPRESSION: No evidence of deep venous thrombosis in either lower extremity. Electronically Signed   By: Lowella Grip III M.D.   On: 08/01/2020 12:45     CODE STATUS:     Code Status Orders  (From admission, onward)         Start     Ordered   08/01/20 1104  Full code  Continuous        08/01/20 1106        Code Status History    Date Active Date Inactive Code Status Order ID Comments User Context   11/18/2016 0051 11/18/2016 2031 Full Code [id number]  Harvie Bridge, DO Inpatient   Advance Care Planning Activity       TOTAL TIME TAKING CARE OF THIS PATIENT: 35 minutes.    Fritzi Mandes M.D  [facility]    CC: Primary care physician; Marguerita Merles, MD

## 2020-08-02 NOTE — Progress Notes (Signed)
[place] for Heparin drip Indication: pulmonary embolus  Allergies  Allergen Reactions  . Dilantin [Phenytoin Sodium Extended] Other (See Comments)  . Penicillins Other (See Comments)    Has patient had a PCN reaction causing immediate rash, facial/tongue/throat swelling, SOB or lightheadedness with hypotension: Unknown Has patient had a PCN reaction causing severe rash involving mucus membranes or skin necrosis: Unknown Has patient had a PCN reaction that required hospitalization: Unknown Has patient had a PCN reaction occurring within the last 10 years: Unknown If all of the above answers are "NO", then may proceed with Cephalosporin use.  . Tegretol [Carbamazepine] Other (See Comments)    Patient Measurements: Height: 6' (182.9 cm) Weight: 108.9 kg (240 lb 1.3 oz) IBW/kg (Calculated) : 77.6 Heparin Dosing Weight: 100.6 kg  Vital Signs: Temp: 97.8 F (36.6 C) (02/23 0437) Temp Source: Oral (02/23 0437) BP: 136/97 (02/23 0437) Pulse Rate: 54 (02/23 0437)  Labs: Recent Labs    08/01/20 0815 08/01/20 1022 08/01/20 1616 08/01/20 2154 08/02/20 0453  HGB 17.6*  --   --   --  16.8  HCT 52.1*  --   --   --  49.4  PLT 319  --   --   --  305  APTT  --  32  --   --   --   LABPROT  --  12.5  --   --   --   INR  --  1.0  --   --   --   HEPARINUNFRC  --   --  0.50 0.46 0.51  CREATININE 1.23  --   --   --  0.98    Estimated Creatinine Clearance: 137.9 mL/min (by C-G formula based on SCr of 0.98 mg/dL).   Medical History: Past Medical History:  Diagnosis Date  . Asthma   . Bronchitis   . Epilepsy ([place])   . Seizures (HCC)     Medications:  Scheduled:  . folic acid  1 mg Oral Daily  . ipratropium-albuterol  3 mL Nebulization Q6H  . multivitamin with minerals  1 tablet Oral Daily  . [START ON 08/04/2020] predniSONE  15 mg Oral Once   Followed by  . [START ON 08/05/2020] predniSONE  10 mg Oral Once   Followed by  . [START ON  08/06/2020] predniSONE  5 mg Oral Once  . predniSONE  25 mg Oral Once   Followed by  . [START ON 08/03/2020] predniSONE  20 mg Oral Once  . sodium chloride flush  3 mL Intravenous Q12H  . thiamine  100 mg Oral Daily   Or  . thiamine  100 mg Intravenous Daily   Infusions:  . sodium chloride    . heparin 1,700 Units/hr (08/01/20 1730)    Assessment: 35 yo M to start Heparin drip for Pulm.Embolism. Hx asthma, seizure, testicular cancer. Presenting with Hemoptysis No anticoag PTA per Med Rec Hgb 17.6  Plt 319   INR 1.0    APTT 32   Goal of Therapy:  Heparin level 0.3-0.7 units/ml Monitor platelets by anticoagulation protocol: Yes   Plan:  Give 7000 units bolus x 1 Start heparin infusion at 1700 units/hr Check anti-Xa level in 6 hours and daily while on heparin Continue to monitor H&H and platelets   2/22 @1616  HL= 0.50. therapeutic x1. 2/22 @ 2154 HL = 0.46, therapeutic x 2 2/23 @ 0453 HL = 0.51, therapeutic x 3  Will continue current rate of 1700 units/hr and check  HL daily while on Heparin. F/u CBC in am  Renda Rolls, PharmD, [facility] 08/02/2020 6:22 AM

## 2020-08-02 NOTE — TOC Initial Note (Signed)
Transition of Care [facility]) - Initial/Assessment Note    Patient Details  Name: Shane Leach MRN: [id number] Date of Birth: [birth year]  [facility] CM/SW Contact:    Beverly Sessions, RN Phone Number: 08/02/2020, 10:36 AM  Clinical Narrative:                 Patient admitted from home with PE Patient states that he lives at home with wife Patient denies issues with transportation  New patient appointment made at [facility] on 3/21. Patient in agreement Discharge medications sent to [facility] .  Patient to pick up at discharge.  Provided application  Patient also provided with 30 day eliquis care Patient states that he drinks approximately 2 beers a day.  Provided patient with substance abuse resources   Expected Discharge Plan: Home/Self Care Barriers to Discharge: No Barriers Identified   Patient Goals and CMS Choice        Expected Discharge Plan and Services Expected Discharge Plan: Home/Self Care         Expected Discharge Date: 08/02/20                                    Prior Living Arrangements/Services   Lives with:: Spouse Patient language and need for interpreter reviewed:: Yes        Need for Family Participation in Patient Care: No (Comment) Care giver support system in place?: Yes (comment)   Criminal Activity/Legal Involvement Pertinent to Current Situation/Hospitalization: No - Comment as needed  Activities of Daily Living Home Assistive Devices/Equipment: None ADL Screening (condition at time of admission) Patient's cognitive ability adequate to safely complete daily activities?: Yes Is the patient deaf or have difficulty hearing?: No Does the patient have difficulty seeing, even when wearing glasses/contacts?: No Does the patient have difficulty concentrating, remembering, or making decisions?: No Patient able to express need for assistance with ADLs?: No Does the patient have difficulty dressing or  bathing?: No Independently performs ADLs?: Yes (appropriate for developmental age) Does the patient have difficulty walking or climbing stairs?: No Weakness of Legs: None Weakness of Arms/Hands: None  Permission Sought/Granted                  Emotional Assessment Appearance:: Appears stated age     Orientation: : Oriented to Self,Oriented to Place,Oriented to  Time,Oriented to Situation   Psych Involvement: No (comment)  Admission diagnosis:  Shortness of breath [R06.02] Pulmonary embolism (HCC) [I26.99] DVT (deep venous thrombosis) ([place]) [I82.409] Abnormal chest CT [R93.89] Acute pulmonary embolism without acute cor pulmonale, unspecified pulmonary embolism type ([place]) [I26.99] Other acute pulmonary embolism, unspecified whether acute cor pulmonale present ([place]) [I26.99] Patient Active Problem List   Diagnosis Date Noted  . Pulmonary embolism ([place]) 08/01/2020  . Alcohol dependence ([place]) 08/01/2020  . Asthma 11/17/2016   PCP:  Marguerita Merles, MD Pharmacy:   [facility] [address] [place] [address] [place]) [place] [address] Phone: [phone] Fax: [phone]  [facility] [place], [place] [address] [place] [address] Phone: [phone] Fax: [phone]     Social Determinants of Health (SDOH) Interventions    Readmission Risk Interventions No flowsheet data found.

## 2020-08-25 ENCOUNTER — Other Ambulatory Visit: Payer: Self-pay

## 2020-08-25 ENCOUNTER — Ambulatory Visit: Payer: Medicaid Other | Admitting: Pharmacy Technician

## 2020-08-25 DIAGNOSIS — Z79899 Other long term (current) drug therapy: Secondary | ICD-10-CM

## 2020-08-25 NOTE — Progress Notes (Signed)
Completed [facility] application and contract.  Patient agreed to all terms of the [facility] contract.    Patient approved to receive medication assistance at [facility] until time for re-certification in 2023, and as long as eligibility criteria continues to be met.    Provided patient with community resource material based on his particular needs.    Iqra Rotundo J. Gwendolin Briel Care Manager [facility]  

## 2020-08-28 ENCOUNTER — Inpatient Hospital Stay: Payer: Medicaid Other

## 2020-08-28 ENCOUNTER — Encounter (INDEPENDENT_AMBULATORY_CARE_PROVIDER_SITE_OTHER): Payer: Self-pay

## 2020-08-28 ENCOUNTER — Inpatient Hospital Stay: Payer: Self-pay | Attending: Internal Medicine | Admitting: Internal Medicine

## 2020-08-28 ENCOUNTER — Encounter: Payer: Self-pay | Admitting: Internal Medicine

## 2020-08-28 DIAGNOSIS — Z87891 Personal history of nicotine dependence: Secondary | ICD-10-CM

## 2020-08-28 DIAGNOSIS — I2699 Other pulmonary embolism without acute cor pulmonale: Secondary | ICD-10-CM

## 2020-08-28 DIAGNOSIS — Z8043 Family history of malignant neoplasm of testis: Secondary | ICD-10-CM

## 2020-08-28 DIAGNOSIS — G4089 Other seizures: Secondary | ICD-10-CM

## 2020-08-28 DIAGNOSIS — R0789 Other chest pain: Secondary | ICD-10-CM

## 2020-08-28 DIAGNOSIS — J90 Pleural effusion, not elsewhere classified: Secondary | ICD-10-CM

## 2020-08-28 MED ORDER — APIXABAN 5 MG PO TABS: 5.0000 mg | ORAL_TABLET | Freq: Two times a day (BID) | ORAL | 2 refills | Status: DC

## 2020-08-28 MED ORDER — TRAMADOL HCL 50 MG PO TABS: 50.0000 mg | ORAL_TABLET | Freq: Three times a day (TID) | ORAL | 0 refills | Status: DC | PRN

## 2020-08-28 NOTE — Assessment & Plan Note (Addendum)
#  Right lower lobe pulmonary embolus/pulmonary infarct-currently on Eliquis.  Recommend 6 months or 12 months of anticoagulation.  Patient tolerating Eliquis well.  New refill given.  Discussed with [place]-patient assistance program.  # Etiology: Unprovoked-recommend hypercoagulable work-up next visit.  Will order.  # Chest wall pain-likely secondary to pulmonary infarct.  Recommend tramadol as needed.  Recommend not driving while taking tramadol.  Thank you for allowing me to participate in the care of your pleasant patient. Please do not hesitate to contact me with questions or concerns in the interim.  # DISPOSITION: # follow up in 3 months- MD; labs- cbc/cmp/hypercoagulable work-up- Dr.B

## 2020-08-28 NOTE — Progress Notes (Signed)
[facility] NOTE  Patient Care Team: Marguerita Merles, MD as PCP - General (Family Medicine)  CHIEF COMPLAINTS/PURPOSE OF CONSULTATION: DVT/PE  # FEB 22nd, 2022- Positive for small pulmonary emboli in right lower lobe branches with associated posterior basal segment lower lobe pulmonary infarct and a small layering right pleural effusion. Other bilateral pulmonary arteries appear patent. No central or saddle embolus.  # Hx of Seizues ? 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     HISTORY OF PRESENTING ILLNESS:  CARLOUS Leach 35 y.o.  male with newly diagnosed right lower lobe pulmonary embolus has been referred to [place] for further evaluation recommendations.  Patient patient was recently seen in the emergency room approximately 3 weeks ago for pleuritic posterior chest wall pain.  CT scan showed small pulmonary emboli in the lower branches; associated with pulmonary infarct.  Patient was initially treated with IV heparin switch over to Eliquis at discharge.  No blood in stools black in stools.  Patient continues to have chest wall pain which seems to be improving not resolved.  States Tylenol is not helping.  With regards risk factors: Long distance travel- none Immobilization/trauma: none Previous history of DVT/PE:  Family history: mother  Review of Systems  Constitutional: Negative for chills, diaphoresis, fever, malaise/fatigue and weight loss.  HENT: Negative for nosebleeds and sore throat.   Eyes: Negative for double vision.  Respiratory: Negative for cough, hemoptysis, sputum production, shortness of breath and wheezing.   Cardiovascular: Positive for chest pain. Negative for palpitations, orthopnea and leg swelling.  Gastrointestinal: Negative for abdominal pain, blood in stool, constipation, diarrhea, heartburn, melena, nausea and vomiting.   Genitourinary: Negative for dysuria, frequency and urgency.  Musculoskeletal: Positive for back pain and joint pain.  Skin: Negative.  Negative for itching and rash.  Neurological: Negative for dizziness, tingling, focal weakness, weakness and headaches.  Endo/Heme/Allergies: Does not bruise/bleed easily.  Psychiatric/Behavioral: Negative for depression. The patient is not nervous/anxious and does not have insomnia.      MEDICAL HISTORY:  Past Medical History:  Diagnosis Date  . Asthma   . Bronchitis   . Epilepsy ([place])   . Seizures ([place])     SURGICAL HISTORY: Past Surgical History:  Procedure Laterality Date  . testicular cancer     right testicle removed    SOCIAL HISTORY: Social History   Socioeconomic History  . Marital status: Married    Spouse name: Not on file  . Number of children: Not on file  . Years of education: Not on file  . Highest education level: Not on file  Occupational History  . Not on file  Tobacco Use  . Smoking status: Former Smoker    Packs/day: 0.50    Types: Cigarettes    Quit date: 10/12/2014    Years since quitting: 5.8  . Smokeless tobacco: Never Used  Substance and Sexual Activity  . Alcohol use: Yes  . Drug use: Not Currently    Types: Marijuana  . Sexual activity: Not on file  Other Topics Concern  . 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 Drinks 2 16 ounce beers/day.    Social Determinants of Health   Financial Resource Strain: Not on file  Food Insecurity: Not on file  Transportation Needs: Not on file  Physical Activity: Not on file  Stress: Not on file  Social Connections: Not on file  Intimate Partner Violence: Not on file    FAMILY HISTORY: Family History  Problem Relation Age of Onset  . Stroke Mother   . Deep vein thrombosis Mother     ALLERGIES:  is allergic to contrast media [iodinated diagnostic agents],  dilantin [phenytoin sodium extended], penicillins, and tegretol [carbamazepine].  MEDICATIONS:  Current Outpatient Medications  Medication Sig Dispense Refill  . albuterol (PROVENTIL) (2.5 MG/3ML) 0.083% nebulizer solution Take 3 mLs (2.5 mg total) by nebulization every 6 (six) hours as needed for wheezing or shortness of breath. 75 mL 3  . albuterol (VENTOLIN HFA) 108 (90 Base) MCG/ACT inhaler Inhale 2 puffs into the lungs every 6 (six) hours as needed for wheezing or shortness of breath. 18 g 2  . HYDROcodone-acetaminophen (NORCO/VICODIN) 5-325 MG tablet Take 1-2 tablets by mouth every 8 (eight) hours as needed for moderate pain or severe pain. 20 tablet 0  . levETIRAcetam (KEPPRA) 1000 MG tablet Take 1 tablet (1,000 mg total) by mouth 2 (two) times daily. 60 tablet 3  . traMADol (ULTRAM) 50 MG tablet Take 1 tablet (50 mg total) by mouth every 8 (eight) hours as needed. 45 tablet 0  . apixaban (ELIQUIS) 5 MG TABS tablet Take 1 tablet (5 mg total) by mouth 2 (two) times daily. 60 tablet 2  . predniSONE (DELTASONE) 10 MG tablet Take 6 tablets  today, on day 2 take 5 tablets, day 3 take 4 tablets, day 4 take 3 tablets, day 5 take  2 tablets and 1 tablet the last day (Patient not taking: Reported on 08/28/2020) 21 tablet 0   No current facility-administered medications for this visit.      Marland Kitchen  PHYSICAL EXAMINATION:  Vitals:   08/28/20 1514  BP: (!) 144/90  Pulse: (!) 113  Resp: 16  Temp: (!) 97.5 F (36.4 C)  SpO2: 97%   Filed Weights   08/28/20 1514  Weight: 249 lb (112.9 kg)    Physical Exam Constitutional:      Comments: Patient is alone.  Ambulating independently  HENT:     Head: Normocephalic and atraumatic.     Mouth/Throat:     Pharynx: No oropharyngeal exudate.  Eyes:     Pupils: Pupils are equal, round, and reactive to light.  Cardiovascular:     Rate and Rhythm: Normal rate and regular rhythm.  Pulmonary:     Effort: Pulmonary effort is normal. No respiratory  distress.     Breath sounds: Normal breath sounds. No wheezing.  Abdominal:     General: Bowel sounds are normal. There is no distension.     Palpations: Abdomen is soft. There is no mass.     Tenderness: There is no abdominal tenderness. There is no guarding or rebound.  Musculoskeletal:        General: No tenderness. Normal range of motion.     Cervical back: Normal range of motion and neck supple.  Skin:    General: Skin is warm.  Neurological:     Mental Status: He is alert and oriented to person, place, and time.  Psychiatric:        Mood and Affect: Affect normal.      LABORATORY DATA:  I have reviewed the data as listed Lab Results  Component Value Date   WBC 8.7 08/02/2020   HGB 16.8 08/02/2020   HCT 49.4 08/02/2020   MCV 92.3 08/02/2020  PLT 305 08/02/2020   Recent Labs    08/31/19 0424 01/20/20 0929 03/22/20 1114 08/01/20 0815 08/02/20 0453  NA 139 140 138 137 135  K 4.0 4.6 4.5 5.0 4.3  CL 103 104 105 105 105  CO2 23 24 22 24  21*  GLUCOSE 96 107* 105* 101* 117*  BUN 10 17 9 11 10   CREATININE 1.15 1.40* 1.27* 1.23 0.98  CALCIUM 9.5 9.6 9.5 9.6 9.2  GFRNONAA >60 >60 >60 >60 >60  GFRAA >60 >60  --   --   --   PROT 7.5  --   --   --   --   ALBUMIN 4.5  --   --   --   --   AST 37  --   --   --   --   ALT 41  --   --   --   --   ALKPHOS 62  --   --   --   --   BILITOT 1.3*  --   --   --   --     RADIOGRAPHIC STUDIES: I have personally reviewed the radiological images as listed and agreed with the findings in the report. DG Chest 2 View  Result Date: 08/01/2020 CLINICAL DATA:  Shortness of breath.  Hemoptysis. EXAM: CHEST - 2 VIEW COMPARISON:  Chest x-ray 03/22/2020. FINDINGS: Mediastinum and hilar structures normal. Heart size normal. No focal infiltrate. No pleural effusion or pneumothorax. No acute bony abnormality. IMPRESSION: No acute cardiopulmonary disease. Electronically Signed   By: Marcello Moores  Register   On: 08/01/2020 08:41   CT Angio Chest PE W  and/or Wo Contrast  Addendum Date: 08/01/2020   ADDENDUM REPORT: 08/01/2020 10:10 ADDENDUM: Study discussed by telephone with Dr. Lennette Bihari PADUCHOWSKI on 08/01/2020 at 0946 hours. Electronically Signed   By: Genevie Ann M.D.   On: 08/01/2020 10:10   Result Date: 08/01/2020 CLINICAL DATA:  35 year old male with right side pleuritic chest pain for 4 days. Mild hemoptysis. EXAM: CT ANGIOGRAPHY CHEST WITH CONTRAST TECHNIQUE: Multidetector CT imaging of the chest was performed using the standard protocol during bolus administration of intravenous contrast. Multiplanar CT image reconstructions and MIPs were obtained to evaluate the vascular anatomy. CONTRAST:  72mL OMNIPAQUE IOHEXOL 350 MG/ML SOLN COMPARISON:  Chest radiographs 0827 hours today and earlier. FINDINGS: Cardiovascular: Good contrast bolus timing in the pulmonary arterial tree. Mild respiratory motion. And there does appear to be abnormal filling defects in the right lower lobe posterior basal segment branches (series 5, image 169, 183) which lead to a peripheral wedge-shaped area of abnormal right lung opacity further described below. However, no other pulmonary artery filling defect is identified. No cardiomegaly or pericardial effusion. No calcified coronary artery atherosclerosis is evident. Negative visible aorta. Mediastinum/Nodes: Negative.  No lymphadenopathy. Lungs/Pleura: Small layering right pleural effusion. Wedge-shaped and peripheral indistinct lung opacity in the posterior basal segment of the right lower lobe encompassing an area of about 5 cm. The opacity tracks into the posterior right costophrenic angle. But elsewhere the lungs appear clear. Mild respiratory motion. Major airways are patent with trace retained secretions at the carina and left mainstem bronchus. Upper Abdomen: Negative visible liver, spleen, pancreas, adrenal glands, kidneys and bowel in the upper abdomen. Musculoskeletal: Negative. Review of the MIP images confirms the above  findings. IMPRESSION: 1. Positive for small pulmonary emboli in right lower lobe branches with associated posterior basal segment lower lobe pulmonary infarct and a small layering right pleural effusion. Other bilateral pulmonary  arteries appear patent. No central or saddle embolus. 2. Otherwise negative CT appearance of the chest. Electronically Signed: By: Genevie Ann M.D. On: 08/01/2020 09:41   US Venous Img Lower Bilateral (DVT)  Result Date: 08/01/2020 CLINICAL DATA:  Pulmonary emboli EXAM: BILATERAL LOWER EXTREMITY VENOUS DUPLEX ULTRASOUND TECHNIQUE: Gray-scale sonography with graded compression, as well as color Doppler and duplex ultrasound were performed to evaluate the lower extremity deep venous systems from the level of the common femoral vein and including the common femoral, femoral, profunda femoral, popliteal and calf veins including the posterior tibial, peroneal and gastrocnemius veins when visible. The superficial great saphenous vein was also interrogated. Spectral Doppler was utilized to evaluate flow at rest and with distal augmentation maneuvers in the common femoral, femoral and popliteal veins. COMPARISON:  None. FINDINGS: RIGHT LOWER EXTREMITY Common Femoral Vein: No evidence of thrombus. Normal compressibility, respiratory phasicity and response to augmentation. Saphenofemoral Junction: No evidence of thrombus. Normal compressibility and flow on color Doppler imaging. Profunda Femoral Vein: No evidence of thrombus. Normal compressibility and flow on color Doppler imaging. Femoral Vein: No evidence of thrombus. Normal compressibility, respiratory phasicity and response to augmentation. Popliteal Vein: No evidence of thrombus. Normal compressibility, respiratory phasicity and response to augmentation. Calf Veins: No evidence of thrombus. Normal compressibility and flow on color Doppler imaging. Superficial Great Saphenous Vein: No evidence of thrombus. Normal compressibility. Venous Reflux:   None. Other Findings:  None. LEFT LOWER EXTREMITY Common Femoral Vein: No evidence of thrombus. Normal compressibility, respiratory phasicity and response to augmentation. Saphenofemoral Junction: No evidence of thrombus. Normal compressibility and flow on color Doppler imaging. Profunda Femoral Vein: No evidence of thrombus. Normal compressibility and flow on color Doppler imaging. Femoral Vein: No evidence of thrombus. Normal compressibility, respiratory phasicity and response to augmentation. Popliteal Vein: No evidence of thrombus. Normal compressibility, respiratory phasicity and response to augmentation. Calf Veins: No evidence of thrombus. Normal compressibility and flow on color Doppler imaging. Superficial Great Saphenous Vein: No evidence of thrombus. Normal compressibility. Venous Reflux:  None. Other Findings:  None. IMPRESSION: No evidence of deep venous thrombosis in either lower extremity. Electronically Signed   By: Lowella Grip III M.D.   On: 08/01/2020 12:45   ECHOCARDIOGRAM COMPLETE  Result Date: 08/02/2020    ECHOCARDIOGRAM REPORT   Patient Name:   TYSE AURIEMMA Date of Exam: 08/01/2020 Medical Rec #:  [id number]          Height:       72.0 in Accession #:    [id number]         Weight:       240.1 lb Date of Birth:  Oct 29, 1985         BSA:          2.303 m Patient Age:    35 years           BP:           127/89 mmHg Patient Gender: M                  HR:           72 bpm. Exam Location:  [facility] Procedure: 2D Echo, Cardiac Doppler and Color Doppler Indications:     R07.9 Chest pain  History:         Patient has no prior history of Echocardiogram examinations.                  Seizures. Epilepsy. Asthma.  Sonographer:  Wilford Sports Rodgers-Jones Referring Phys:  [id number] AGBATA Diagnosing Phys: Kate Sable MD IMPRESSIONS  1. Left ventricular ejection fraction, by estimation, is 60 to 65%. The left ventricle has normal function. The left ventricle has no regional wall motion  abnormalities. Left ventricular diastolic parameters were normal.  2. Right ventricular systolic function is normal. The right ventricular size is normal.  3. The mitral valve is normal in structure. No evidence of mitral valve regurgitation. No evidence of mitral stenosis.  4. The aortic valve is normal in structure. Aortic valve regurgitation is not visualized. No aortic stenosis is present.  5. The inferior vena cava is normal in size with greater than 50% respiratory variability, suggesting right atrial pressure of 3 mmHg. FINDINGS  Left Ventricle: Left ventricular ejection fraction, by estimation, is 60 to 65%. The left ventricle has normal function. The left ventricle has no regional wall motion abnormalities. The left ventricular internal cavity size was normal in size. There is  no left ventricular hypertrophy. Left ventricular diastolic parameters were normal. Right Ventricle: The right ventricular size is normal. No increase in right ventricular wall thickness. Right ventricular systolic function is normal. Left Atrium: Left atrial size was normal in size. Right Atrium: Right atrial size was normal in size. Pericardium: There is no evidence of pericardial effusion. Mitral Valve: The mitral valve is normal in structure. No evidence of mitral valve regurgitation. No evidence of mitral valve stenosis. Tricuspid Valve: The tricuspid valve is normal in structure. Tricuspid valve regurgitation is not demonstrated. No evidence of tricuspid stenosis. Aortic Valve: The aortic valve is normal in structure. Aortic valve regurgitation is not visualized. No aortic stenosis is present. Pulmonic Valve: The pulmonic valve was normal in structure. Pulmonic valve regurgitation is not visualized. No evidence of pulmonic stenosis. Aorta: The aortic root is normal in size and structure. Venous: The inferior vena cava is normal in size with greater than 50% respiratory variability, suggesting right atrial pressure of 3 mmHg.  IAS/Shunts: No atrial level shunt detected by color flow Doppler.  LEFT VENTRICLE PLAX 2D LVIDd:         5.08 cm  Diastology LVIDs:         2.92 cm  LV e' medial:    7.94 cm/s LV PW:         0.97 cm  LV E/e' medial:  13.7 LV IVS:        0.60 cm  LV e' lateral:   11.70 cm/s LVOT diam:     1.90 cm  LV E/e' lateral: 9.3 LV SV:         56 LV SV Index:   24 LVOT Area:     2.84 cm  RIGHT VENTRICLE RV Basal diam:  3.53 cm RV S prime:     10.20 cm/s TAPSE (M-mode): 1.9 cm LEFT ATRIUM             Index       RIGHT ATRIUM          Index LA diam:        3.80 cm 1.65 cm/m  RA Area:     9.56 cm LA Vol (A2C):   33.0 ml 14.33 ml/m RA Volume:   21.20 ml 9.21 ml/m LA Vol (A4C):   29.3 ml 12.73 ml/m LA Biplane Vol: 31.2 ml 13.55 ml/m  AORTIC VALVE LVOT Vmax:   84.20 cm/s LVOT Vmean:  62.200 cm/s LVOT VTI:    0.196 m  AORTA Ao Root diam: 3.20 cm MITRAL  VALVE                TRICUSPID VALVE MV Area (PHT): 4.89 cm     TR Peak grad:   18.8 mmHg MV Decel Time: 155 msec     TR Vmax:        217.00 cm/s MV E velocity: 109.00 cm/s MV A velocity: 66.00 cm/s   SHUNTS MV E/A ratio:  1.65         Systemic VTI:  0.20 m                             Systemic Diam: 1.90 cm Kate Sable MD Electronically signed by Kate Sable MD Signature Date/Time: 08/02/2020/3:57:14 PM    Final     ASSESSMENT & PLAN:   Pulmonary embolism on right [facility]) #Right lower lobe pulmonary embolus/pulmonary infarct-currently on Eliquis.  Recommend 6 months or 12 months of anticoagulation.  Patient tolerating Eliquis well.  New refill given.  Discussed with [place]-patient assistance program.  # Etiology: Unprovoked-recommend hypercoagulable work-up next visit.  Will order.  # Chest wall pain-likely secondary to pulmonary infarct.  Recommend tramadol as needed.  Recommend not driving while taking tramadol.  Thank you for allowing me to participate in the care of your pleasant patient. Please do not hesitate to contact me with questions or concerns in  the interim.  # DISPOSITION: # follow up in 3 months- MD; labs- cbc/cmp/hypercoagulable work-up- Dr.B    All questions were answered. The patient knows to call the clinic with any problems, questions or concerns.       Cammie Sickle, MD 08/28/2020 5:03 PM

## 2020-08-28 NOTE — Progress Notes (Signed)
Having pain in lower back on R side. Wants to know if this is normal to still have pain from blood clot.

## 2020-08-29 NOTE — Addendum Note (Signed)
Addended by: Delice Bison E on: 08/29/2020 08:20 AM   Modules accepted: Orders

## 2020-09-01 ENCOUNTER — Telehealth: Payer: Self-pay | Admitting: Pharmacy Technician

## 2020-09-01 NOTE — Telephone Encounter (Signed)
Oral Oncology Patient Advocate Encounter  Had patient to complete application at appointment for [place] in an effort to reduce patient's out of pocket expense for Eliquis to $0.    Application completed and faxed to [phone].   [facility] phone number for follow up is [phone].  This encounter will be updated until final determination.  [place] Patient [place] Phone [phone] Fax [phone] 09/01/2020 9:39 AM

## 2020-09-06 ENCOUNTER — Ambulatory Visit: Payer: Medicaid Other | Admitting: Adult Health

## 2020-09-06 ENCOUNTER — Encounter: Payer: Self-pay | Admitting: Pharmacy Technician

## 2020-09-06 ENCOUNTER — Other Ambulatory Visit: Payer: Self-pay | Admitting: Gerontology

## 2020-09-06 ENCOUNTER — Encounter: Payer: Self-pay | Admitting: Adult Health

## 2020-09-06 ENCOUNTER — Other Ambulatory Visit: Payer: Self-pay

## 2020-09-06 VITALS — BP 131/82 | HR 105 | Temp 98.1°F | Resp 16 | Ht 72.0 in | Wt 246.3 lb

## 2020-09-06 DIAGNOSIS — R000 Tachycardia, unspecified: Secondary | ICD-10-CM

## 2020-09-06 DIAGNOSIS — Z0000 Encounter for general adult medical examination without abnormal findings: Secondary | ICD-10-CM

## 2020-09-06 DIAGNOSIS — M545 Low back pain, unspecified: Secondary | ICD-10-CM

## 2020-09-06 DIAGNOSIS — I2699 Other pulmonary embolism without acute cor pulmonale: Secondary | ICD-10-CM

## 2020-09-06 DIAGNOSIS — J4540 Moderate persistent asthma, uncomplicated: Secondary | ICD-10-CM

## 2020-09-06 DIAGNOSIS — F1021 Alcohol dependence, in remission: Secondary | ICD-10-CM

## 2020-09-06 MED ORDER — PREDNISONE 20 MG PO TABS: 40.0000 mg | ORAL_TABLET | Freq: Every day | ORAL | 0 refills | Status: DC

## 2020-09-06 MED ORDER — PREDNISONE 10 MG (21) PO TBPK: ORAL_TABLET | ORAL | 0 refills | Status: DC

## 2020-09-06 MED ORDER — PREDNISONE 10 MG PO TABS: ORAL_TABLET | ORAL | 0 refills | Status: DC

## 2020-09-06 NOTE — Progress Notes (Signed)
Patient ID: Shane Leach, male   DOB: 03-15-1986, 35 y.o.   MRN: [id number]   Cc: Establish primary care    HPI  Shane Leach is a 35 y.o. male. Who presents for an initial office visit. He has not had any routine primary care services for years. He has a h/o asthma and reports increased use of rescue inhalers. He reports taking albuterol nebs 3-4 times a day. Reports wheezing that occurs in the morning and evening. Pain between shoulder blades that he describes as as constant, achy and 6/10 in intensity. No relief with OTC pain remedies. He had a PE and was hospitalized. No recurrence. He is on anticoagulation. He denies any alcohol intake in the last three weeks. He right-sided back pain that is chronic. Does not do any back exercises. Denies chest pain, nausea, vomiting, dizziness and headache but reports palpitations that are worse after the breathing treatments. Otherwise he offers no other complaints. He does not check his blood pressure routinely. Physical activity has been limited due dyspnea. He reports a h/o seizures but none recently. He report medication adherence.    Past Medical History:  Diagnosis Date   Asthma    Bronchitis    Epilepsy ([place])    Seizures ([place])     Past Surgical History:  Procedure Laterality Date   testicular cancer     right testicle removed    Family History  Problem Relation Age of Onset   Stroke Mother    Deep vein thrombosis Mother     Social History Social History   Tobacco Use   Smoking status: Former Smoker    Packs/day: 0.50    Types: Cigarettes    Quit date: 10/12/2014    Years since quitting: 5.9   Smokeless tobacco: Never Used  Substance Use Topics   Alcohol use: Yes   Drug use: Not Currently    Types: Marijuana    Allergies  Allergen Reactions   Contrast Media [Iodinated Diagnostic Agents] Itching   Dilantin [Phenytoin Sodium Extended] Other (See Comments)   Penicillins Other (See Comments)    Has patient had a PCN  reaction causing immediate rash, facial/tongue/throat swelling, SOB or lightheadedness with hypotension: Unknown Has patient had a PCN reaction causing severe rash involving mucus membranes or skin necrosis: Unknown Has patient had a PCN reaction that required hospitalization: Unknown Has patient had a PCN reaction occurring within the last 10 years: Unknown If all of the above answers are "NO", then may proceed with Cephalosporin use.   Tegretol [Carbamazepine] Other (See Comments)    Current Outpatient Medications  Medication Sig Dispense Refill   albuterol (PROVENTIL) (2.5 MG/3ML) 0.083% nebulizer solution Take 3 mLs (2.5 mg total) by nebulization every 6 (six) hours as needed for wheezing or shortness of breath. 75 mL 3   albuterol (VENTOLIN HFA) 108 (90 Base) MCG/ACT inhaler Inhale 2 puffs into the lungs every 6 (six) hours as needed for wheezing or shortness of breath. 18 g 2   apixaban (ELIQUIS) 5 MG TABS tablet Take 1 tablet (5 mg total) by mouth 2 (two) times daily. 60 tablet 2   HYDROcodone-acetaminophen (NORCO/VICODIN) 5-325 MG tablet Take 1-2 tablets by mouth every 8 (eight) hours as needed for moderate pain or severe pain. 20 tablet 0   levETIRAcetam (KEPPRA) 1000 MG tablet Take 1 tablet (1,000 mg total) by mouth 2 (two) times daily. 60 tablet 3   predniSONE (DELTASONE) 10 MG tablet Take 6 tablets  today, on day 2  take 5 tablets, day 3 take 4 tablets, day 4 take 3 tablets, day 5 take  2 tablets and 1 tablet the last day (Patient not taking: Reported on 08/28/2020) 21 tablet 0   traMADol (ULTRAM) 50 MG tablet Take 1 tablet (50 mg total) by mouth every 8 (eight) hours as needed. 45 tablet 0   No current facility-administered medications for this visit.    Review of Systems Review of Systems  Constitutional: Negative.   HENT: Negative.    Eyes: Negative.   Respiratory:  Positive for shortness of breath and wheezing.   Endocrine: Negative.   Musculoskeletal:  Positive for back  pain. Negative for gait problem.  Skin: Negative.   Allergic/Immunologic: Negative.   Neurological: Negative.   Psychiatric/Behavioral: Negative.     There were no vitals taken for this visit.  Physical Exam Physical Exam Vitals and nursing note reviewed.  Constitutional:      Appearance: Normal appearance.  HENT:     Head: Normocephalic and atraumatic.     Nose: Nose normal.     Mouth/Throat:     Mouth: Mucous membranes are moist.  Eyes:     Extraocular Movements: Extraocular movements intact.     Conjunctiva/sclera: Conjunctivae normal.     Pupils: Pupils are equal, round, and reactive to light.  Cardiovascular:     Rate and Rhythm: Regular rhythm. Tachycardia present.     Pulses: Normal pulses.     Heart sounds: Normal heart sounds.  Pulmonary:     Effort: Pulmonary effort is normal. No respiratory distress.     Breath sounds: Wheezing (Expiratory wheezes in all lung fields) present.  Abdominal:     General: Abdomen is flat. Bowel sounds are normal.     Palpations: Abdomen is soft.  Musculoskeletal:        General: Tenderness (Low back tenderness with point palpation) present. Normal range of motion.     Cervical back: Normal range of motion and neck supple.  Skin:    General: Skin is warm and dry.     Capillary Refill: Capillary refill takes less than 2 seconds.  Neurological:     General: No focal deficit present.     Mental Status: He is alert and oriented to person, place, and time. Mental status is at baseline.  Psychiatric:        Mood and Affect: Mood normal.    Data Reviewed Old records reviewed  Assessment and Plan 1. Moderate persistent asthma without complication Patient advised to continue albuterol, advair and start patient on a prednisone taper.  - DG Chest 2 View; Future - Beta-2-glycoprotein i abs, IgG/M/A - Cardiolipin antibodies, IgG, IgM, IgA -  2. Pulmonary embolism on right ([place]) Continue eliquis. F/U with pulmonologist and hematologist  as scheduled. Will give charity care applicatioins -Protein C activity - Protein S activity - Protein S, total - Protein S, total and functional panel - Prothrombin Gene Mutation - Factor 5 leiden  3. Alcohol dependence in remission [facility]) Patient advise dto notify the clinic in case of relapse. Relapse prevention reviewed  4. Chronic right-sided low back pain without sciatica Continue OTC pain remedies. Back exercises reviewed. Will refer to ortho if symptoms worsen  5. Health maintenance examination Abnormal physical exam. Patient is at increased risk for thromboembolic events. Will obtain routine labs and initiate referral to hematology for further diagnostics to r/o coagulopathies - CBC w/Diff - Comp Met (CMET) - Magnesium - Phosphorus - TSH - Protime-INR - Lipid Profile -  HgB A1c  6. Sinus tachycardia Likely due to PE. Patient advised to monitor HR, BP and SPO2. ED precautions given. Will re-assess at next visit.  RTC in 2 weeks   Magddalene S Tukov-Yual 09/06/2020, 12:05 PM

## 2020-09-06 NOTE — Telephone Encounter (Addendum)
Oral Oncology Patient Advocate Encounter  Received notification from [place] [facility]  that patient has been successfully enrolled into their program to receive Eliquis from the manufacturer at $0 out of pocket until 06/09/21.    I have spoke with the patient about the approval and that he will need to reapply towards the end of the year.  Specialty Pharmacy that will dispense medication is [facility].  Patient knows to call the office with questions or concerns.   Oral Oncology Clinic will continue to follow.  [place] Patient [place] Phone [phone] Fax [phone] 09/06/2020 9:34 AM

## 2020-09-07 ENCOUNTER — Ambulatory Visit
Admission: RE | Admit: 2020-09-07 | Discharge: 2020-09-07 | Disposition: A | Discharge status: Home / Self Care | Payer: Self-pay | Source: Ambulatory Visit | Attending: Adult Health | Admitting: Adult Health

## 2020-09-07 ENCOUNTER — Ambulatory Visit
Admission: RE | Admit: 2020-09-07 | Discharge: 2020-09-07 | Disposition: A | Discharge status: Home / Self Care | Payer: Self-pay | Attending: Adult Health | Admitting: Adult Health

## 2020-09-07 DIAGNOSIS — J4540 Moderate persistent asthma, uncomplicated: Secondary | ICD-10-CM

## 2020-09-08 LAB — COMPREHENSIVE METABOLIC PANEL
ALT: 33 IU/L (ref 0–44)
AST: 42 IU/L — ABNORMAL HIGH (ref 0–40)
Albumin/Globulin Ratio: 1.8 (ref 1.2–2.2)
Albumin: 4.7 g/dL (ref 4.0–5.0)
Alkaline Phosphatase: 81 IU/L (ref 44–121)
BUN/Creatinine Ratio: 6 — ABNORMAL LOW (ref 9–20)
BUN: 7 mg/dL (ref 6–20)
Bilirubin Total: 0.5 mg/dL (ref 0.0–1.2)
CO2: 18 mmol/L — ABNORMAL LOW (ref 20–29)
Calcium: 10 mg/dL (ref 8.7–10.2)
Chloride: 102 mmol/L (ref 96–106)
Creatinine, Ser: 1.18 mg/dL (ref 0.76–1.27)
Globulin, Total: 2.6 g/dL (ref 1.5–4.5)
Glucose: 98 mg/dL (ref 65–99)
Potassium: 5.3 mmol/L — ABNORMAL HIGH (ref 3.5–5.2)
Sodium: 137 mmol/L (ref 134–144)
Total Protein: 7.3 g/dL (ref 6.0–8.5)
eGFR: 84 mL/min/{1.73_m2} (ref >59)

## 2020-09-08 LAB — PROTEIN C ACTIVITY: Protein C Activity: 136 % (ref 73–180)

## 2020-09-08 LAB — LIPID PANEL
Chol/HDL Ratio: 3 ratio (ref 0.0–5.0)
Cholesterol, Total: 185 mg/dL (ref 100–199)
HDL: 62 mg/dL (ref >39)
LDL Chol Calc (NIH): 112 mg/dL — ABNORMAL HIGH (ref 0–99)
Triglycerides: 60 mg/dL (ref 0–149)
VLDL Cholesterol Cal: 11 mg/dL (ref 5–40)

## 2020-09-08 LAB — CBC WITH DIFFERENTIAL/PLATELET
Basophils Absolute: 0.1 10*3/uL (ref 0.0–0.2)
Basos: 1 %
EOS (ABSOLUTE): 0.7 10*3/uL — ABNORMAL HIGH (ref 0.0–0.4)
Eos: 9 %
Hematocrit: 51.6 % — ABNORMAL HIGH (ref 37.5–51.0)
Hemoglobin: 18 g/dL — ABNORMAL HIGH (ref 13.0–17.7)
Immature Grans (Abs): 0 10*3/uL (ref 0.0–0.1)
Immature Granulocytes: 1 %
Lymphocytes Absolute: 1.4 10*3/uL (ref 0.7–3.1)
Lymphs: 18 %
MCH: 31.5 pg (ref 26.6–33.0)
MCHC: 34.9 g/dL (ref 31.5–35.7)
MCV: 90 fL (ref 79–97)
Monocytes Absolute: 0.7 10*3/uL (ref 0.1–0.9)
Monocytes: 9 %
Neutrophils Absolute: 4.9 10*3/uL (ref 1.4–7.0)
Neutrophils: 62 %
Platelets: 260 10*3/uL (ref 150–450)
RBC: 5.71 x10E6/uL (ref 4.14–5.80)
RDW: 13.2 % (ref 11.6–15.4)
WBC: 7.8 10*3/uL (ref 3.4–10.8)

## 2020-09-08 LAB — CARDIOLIPIN ANTIBODIES, IGG, IGM, IGA
Anticardiolipin IgA: <9 APL U/mL (ref 0–11)
Anticardiolipin IgG: <9 GPL U/mL (ref 0–14)
Anticardiolipin IgM: <9 MPL U/mL (ref 0–12)

## 2020-09-08 LAB — PROTEIN S, TOTAL: Protein S Ag, Total: 123 % (ref 60–150)

## 2020-09-08 LAB — PROTEIN S ACTIVITY: Protein S Activity: 139 % (ref 63–140)

## 2020-09-08 LAB — MAGNESIUM: Magnesium: 2.1 mg/dL (ref 1.6–2.3)

## 2020-09-08 LAB — HEMOGLOBIN A1C
Est. average glucose Bld gHb Est-mCnc: 114 mg/dL
Hgb A1c MFr Bld: 5.6 % (ref 4.8–5.6)

## 2020-09-08 LAB — BETA-2-GLYCOPROTEIN I ABS, IGG/M/A
Beta-2 Glyco 1 IgA: <9 GPI IgA units (ref 0–25)
Beta-2 Glyco 1 IgM: <9 GPI IgM units (ref 0–32)
Beta-2 Glyco I IgG: <9 GPI IgG units (ref 0–20)

## 2020-09-08 LAB — PHOSPHORUS: Phosphorus: 2.9 mg/dL (ref 2.8–4.1)

## 2020-09-08 LAB — PROTIME-INR

## 2020-09-08 LAB — TSH: TSH: 1.31 u[IU]/mL (ref 0.450–4.500)

## 2020-09-13 ENCOUNTER — Other Ambulatory Visit: Payer: Self-pay

## 2020-09-13 ENCOUNTER — Ambulatory Visit: Payer: Medicaid Other | Admitting: Gerontology

## 2020-09-13 VITALS — BP 126/82 | HR 102 | Resp 16 | Wt 254.5 lb

## 2020-09-13 DIAGNOSIS — F1020 Alcohol dependence, uncomplicated: Secondary | ICD-10-CM

## 2020-09-13 DIAGNOSIS — R059 Cough, unspecified: Secondary | ICD-10-CM

## 2020-09-13 DIAGNOSIS — R899 Unspecified abnormal finding in specimens from other organs, systems and tissues: Secondary | ICD-10-CM

## 2020-09-13 DIAGNOSIS — J4540 Moderate persistent asthma, uncomplicated: Secondary | ICD-10-CM

## 2020-09-13 DIAGNOSIS — Z87898 Personal history of other specified conditions: Secondary | ICD-10-CM

## 2020-09-13 DIAGNOSIS — R000 Tachycardia, unspecified: Secondary | ICD-10-CM

## 2020-09-13 MED ORDER — ALBUTEROL SULFATE HFA 108 (90 BASE) MCG/ACT IN AERS
INHALATION_SPRAY | RESPIRATORY_TRACT | 2 refills | Status: DC
  Filled 2020-09-13: qty 6.7, 25d supply, fill #0
  Filled 2020-10-25: qty 6.7, 25d supply, fill #1

## 2020-09-13 MED ORDER — FLUTICASONE-SALMETEROL 250-50 MCG/DOSE IN AEPB
1.0000 | INHALATION_SPRAY | Freq: Every day | RESPIRATORY_TRACT | 2 refills | Status: DC
  Filled 2020-09-13: qty 60, 60d supply, fill #0

## 2020-09-13 MED ORDER — BENZONATATE 100 MG PO CAPS
100.0000 mg | ORAL_CAPSULE | Freq: Two times a day (BID) | ORAL | 0 refills | Status: DC | PRN
  Filled 2020-09-13: qty 20, 10d supply, fill #0

## 2020-09-13 NOTE — Progress Notes (Signed)
Established Patient Office Visit  Subjective:  Patient ID: Shane Leach, male    DOB: [birth year]  Age: 35 y.o. MRN: [id number]  CC: No chief complaint on file.   HPI Shane Leach  35 y/o male who has history of Pulmonary Embolism, Asthma, Bronchitis and seizure presents for routine visit and lab review. He states that he's compliant with his medication and denies any side effects. He was seen by Hematologist Dr Rogue Bussing on 08/28/20, he will continue on Eliquis for 6 months or 12 months and he will follow up in 3 months. He was also treated with Prednisone after his last visit on 09/06/20. He had chest x ray done on 09/07/20 and it showed Trace pleural effusion at the right lung base with potentially small volume associated airspace disease/consolidation, compatible with prior CT findings. He reports mild dyspnea on exertion. He uses his Albuterol 2-3 times daily and breathing treatments. He continues to experience intermittent cough with clear productive phelgm. He states that cough wakes him up at night. He denies any Asthma flare, states that his breathing is stable, denies wheezing, and chest tightness. His heart rate has been elevated during his previous appointments, he denies chest pain and  light headedness. LDL was 112 mg/dl, AST was 42 and he reports drinking 2 cans of beer daily and admits that he will cut back on his alcoholic beverage intake. Overall, he states that he's doing well and offers no further complaint.  Past Medical History:  Diagnosis Date  . Asthma   . Bronchitis   . Cancer [facility])    Testicular cancer as a child  . Epilepsy ([place])   . EtOH dependence ([place])    In remission  . Pulmonary embolism ([place]) 06/19/2020  . Seizures ([place])     Past Surgical History:  Procedure Laterality Date  . testicular cancer     right testicle removed    Family History  Problem Relation Age of Onset  . Stroke Mother   . Deep vein thrombosis Mother   . Diabetes  Mother   . Hypertension Mother   . Cancer Father     Social History   Socioeconomic History  . Marital status: Married    Spouse name: Not on file  . Number of children: Not on file  . Years of education: Not on file  . Highest education level: Not on file  Occupational History  . Not on file  Tobacco Use  . Smoking status: Former Smoker    Packs/day: 0.50    Years: 15.00    Pack years: 7.50    Types: Cigarettes    Quit date: 07/10/2020    Years since quitting: 0.1  . Smokeless tobacco: Never Used  Substance and Sexual Activity  . Alcohol use: Yes    Alcohol/week: 2.0 standard drinks    Types: 2 Cans of beer per week    Comment: drinks 2 16 oz cans of beer weekly  . Drug use: Not Currently    Types: Marijuana    Comment: used marijuana in the past  . Sexual activity: Yes    Partners: Female    Birth control/protection: Condom    Comment: in a monogamous relationship  Other Topics Concern  . Not on file  Social History Narrative   Lives in [place]; 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 Drinks 2 16 ounce beers/day.    Social Determinants of Health   Financial Resource Strain: Medium Risk  .  Difficulty of Paying Living Expenses: Somewhat hard  Food Insecurity: No Food Insecurity  . Worried About Charity fundraiser in the Last Year: Never true  . Ran Out of Food in the Last Year: Never true  Transportation Needs: No Transportation Needs  . Lack of Transportation (Medical): No  . Lack of Transportation (Non-Medical): No  Physical Activity: Sufficiently Active  . Days of Exercise per Week: 3 days  . Minutes of Exercise per Session: 120 min  Stress: No Stress Concern Present  . Feeling of Stress : Only a little  Social Connections: Socially Isolated  . Frequency of Communication with Friends and Family: Twice a week  . Frequency of Social Gatherings with Friends and Family: Once a week  . Attends Religious Services:  Never  . Active Member of Clubs or Organizations: No  . Attends Archivist Meetings: Never  . Marital Status: Separated  Intimate Partner Violence: Not on file    Outpatient Medications Prior to Visit  Medication Sig Dispense Refill  . albuterol (PROVENTIL) (2.5 MG/3ML) 0.083% nebulizer solution USE 1 VIAL VIA NEBULIZER EVERY 6 HOURS AS NEEDED FOR WHEEZING OR SHORTNESS OF BREATH 90 mL 3  . apixaban (ELIQUIS) 5 MG TABS tablet Take 1 tablet (5 mg total) by mouth 2 (two) times daily. 60 tablet 2  . levETIRAcetam (KEPPRA) 1000 MG tablet TAKE ONE TABLET BY MOUTH 2 TIMES A DAY 60 tablet 3  . albuterol (VENTOLIN HFA) 108 (90 Base) MCG/ACT inhaler INHALE 2 PUFFS INTO THE LUNGS EVERY 6 HOURS AS NEEDED FOR WHEEZING OR SHORT OF BREATH 6.7 g 2  . HYDROcodone-acetaminophen (NORCO/VICODIN) 5-325 MG tablet Take 1-2 tablets by mouth every 8 (eight) hours as needed for moderate pain or severe pain. (Patient not taking: No sig reported) 20 tablet 0  . predniSONE (DELTASONE) 20 MG tablet TAKE TWO TABLETS (40MG ) BY MOUTH EVERY DAY WITH BREAKFAST (Patient not taking: Reported on 09/13/2020) 10 tablet 0  . traMADol (ULTRAM) 50 MG tablet Take 1 tablet (50 mg total) by mouth every 8 (eight) hours as needed. (Patient not taking: Reported on 09/13/2020) 45 tablet 0   No facility-administered medications prior to visit.    Allergies  Allergen Reactions  . Contrast Media [Iodinated Diagnostic Agents] Itching  . Dilantin [Phenytoin Sodium Extended] Other (See Comments)  . Penicillins Other (See Comments)    Has patient had a PCN reaction causing immediate rash, facial/tongue/throat swelling, SOB or lightheadedness with hypotension: Unknown Has patient had a PCN reaction causing severe rash involving mucus membranes or skin necrosis: Unknown Has patient had a PCN reaction that required hospitalization: Unknown Has patient had a PCN reaction occurring within the last 10 years: Unknown If all of the above answers  are "NO", then may proceed with Cephalosporin use.  . Tegretol [Carbamazepine] Other (See Comments)    ROS Review of Systems  Constitutional: Negative.   Respiratory: Positive for cough (intermittent cough), shortness of breath and wheezing. Negative for chest tightness.   Cardiovascular: Negative.   Gastrointestinal: Negative for blood in stool.  Genitourinary: Negative for hematuria.  Neurological: Negative.   Hematological: Negative.   Psychiatric/Behavioral: Negative.       Objective:    Physical Exam HENT:     Head: Normocephalic and atraumatic.  Cardiovascular:     Rate and Rhythm: Regular rhythm. Tachycardia present.     Pulses: Normal pulses.     Heart sounds: Normal heart sounds.  Pulmonary:     Effort: Pulmonary effort is normal.  Breath sounds: Normal breath sounds.  Skin:    General: Skin is warm.  Neurological:     General: No focal deficit present.     Mental Status: He is alert and oriented to person, place, and time. Mental status is at baseline.  Psychiatric:        Mood and Affect: Mood normal.        Behavior: Behavior normal.        Thought Content: Thought content normal.        Judgment: Judgment normal.     BP 126/82 (BP Location: Left Arm, Patient Position: Sitting, Cuff Size: Large)   Pulse (!) 102   Resp 16   Wt 254 lb 8 oz (115.4 kg)   SpO2 99%   BMI 34.52 kg/m  Wt Readings from Last 3 Encounters:  09/17/20 246 lb (111.6 kg)  09/13/20 254 lb 8 oz (115.4 kg)  09/06/20 246 lb 4.8 oz (111.7 kg)   Encouraged on weight loss  Health Maintenance Due  Topic Date Due  . Hepatitis C Screening  Never done  . COVID-19 Vaccine (1) Never done  . TETANUS/TDAP  Never done    There are no preventive care reminders to display for this patient.  Lab Results  Component Value Date   TSH 1.310 09/06/2020   Lab Results  Component Value Date   WBC 10.5 09/17/2020   HGB 15.6 09/17/2020   HCT 46.3 09/17/2020   MCV 91.0 09/17/2020   PLT  270 09/17/2020   Lab Results  Component Value Date   NA 135 09/17/2020   K 4.4 09/17/2020   CO2 21 (L) 09/17/2020   GLUCOSE 106 (H) 09/17/2020   BUN 12 09/17/2020   CREATININE 1.21 09/17/2020   BILITOT 1.2 09/17/2020   ALKPHOS 54 09/17/2020   AST 28 09/17/2020   ALT 30 09/17/2020   PROT 7.4 09/17/2020   ALBUMIN 4.0 09/17/2020   CALCIUM 9.1 09/17/2020   ANIONGAP 7 09/17/2020   Lab Results  Component Value Date   CHOL 185 09/06/2020   Lab Results  Component Value Date   HDL 62 09/06/2020   Lab Results  Component Value Date   LDLCALC 112 (H) 09/06/2020   Lab Results  Component Value Date   TRIG 60 09/06/2020   Lab Results  Component Value Date   CHOLHDL 3.0 09/06/2020   Lab Results  Component Value Date   HGBA1C 5.6 09/06/2020      Assessment & Plan:     1. Moderate persistent asthma without complication - He will continue on Albuterol as needed. He was educated on medication side effects and advised to notify clinic. - albuterol (VENTOLIN HFA) 108 (90 Base) MCG/ACT inhaler; INHALE 2 PUFFS INTO THE LUNGS EVERY 6 HOURS AS NEEDED FOR WHEEZING OR SHORT OF BREATH  Dispense: 6.7 g; Refill: 2  2. Uncomplicated alcohol dependence ([place]) - He was advised on alcohol abstinence.  3. History of seizure - He will continue on medication regimen, will check Keppra level. - Levetiracetam level; Future  4. Cough - He will continue on Benzonatate as needed, educated on medication side effects and advised to notify clinic - benzonatate (TESSALON PERLES) 100 MG capsule; Take 1 capsule (100 mg total) by mouth 2 (two) times daily as needed for cough.  Dispense: 20 capsule; Refill: 0  5. Abnormal laboratory test result - His AST was elevated, advised on alcohol abstinence, to continue on low fat/cholesterol diet and exercise as tolerated.  6. Increased heart  rate - His heart rate was elevated, he denies chest pain. He was advised to go to the ED for worsening  symptoms.   Follow-up: Return in about 12 weeks (around 12/06/2020), or if symptoms worsen or fail to improve.    Sheliah Fiorillo Jerold Coombe, NP

## 2020-09-13 NOTE — Patient Instructions (Signed)
Alcohol Abuse and Dependence Information, Adult Alcohol is a widely available drug. People drink alcohol in different amounts. People who drink alcohol very often and in large amounts often have problems during and after drinking. They may develop what is called an alcohol use disorder. There are two main types of alcohol use disorders:  Alcohol abuse. This is when you use alcohol too much or too often. You may use alcohol to make yourself feel happy or to reduce stress. You may have a hard time setting a limit on the amount you drink.  Alcohol dependence. This is when you use alcohol consistently for a period of time, and your body changes as a result. This can make it hard to stop drinking because you may start to feel sick or feel different when you do not use alcohol. These symptoms are known as withdrawal. How can alcohol abuse and dependence affect me? Alcohol abuse and dependence can have a negative effect on your life. Drinking too much can lead to addiction. You may feel like you need alcohol to function normally. You may drink alcohol before work in the morning, during the day, or as soon as you get home from work in the evening. These actions can result in:  Poor work performance.  Job loss.  Financial problems.  Car crashes or criminal charges from driving after drinking alcohol.  Problems in your relationships with friends and family.  Losing the trust and respect of coworkers, friends, and family. Drinking heavily over a long period of time can permanently damage your body and brain, and can cause lifelong health issues, such as:  Damage to your liver or pancreas.  Heart problems, high blood pressure, or stroke.  Certain cancers.  Decreased ability to fight infections.  Brain or nerve damage.  Depression.  Early (premature) death. If you are careless or you crave alcohol, it is easy to drink more than your body can handle (overdose). Alcohol overdose is a serious  situation that requires hospitalization. It may lead to permanent injuries or death. What can increase my risk?  Having a family history of alcohol abuse.  Having depression or other mental health conditions.  Beginning to drink at an early age.  Binge drinking often.  Experiencing trauma, stress, and an unstable home life during childhood.  Spending time with people who drink often. What actions can I take to prevent or manage alcohol abuse and dependence?  Do not drink alcohol if: ? Your health care provider tells you not to drink. ? You are pregnant, may be pregnant, or are planning to become pregnant.  If you drink alcohol: ? Limit how much you use to:  0-1 drink a day for women.  0-2 drinks a day for men. ? Be aware of how much alcohol is in your drink. In the [place] one drink equals one 12 oz bottle of beer (355 mL), one 5 oz glass of wine (148 mL), or one 1 oz glass of hard liquor (44 mL).  Stop drinking if you have been drinking too much. This can be very hard to do if you are used to abusing alcohol. If you begin to have withdrawal symptoms, talk with your health care provider or a person that you trust. These symptoms may include anxiety, shaky hands, headache, nausea, sweating, or not being able to sleep.  Choose to drink nonalcoholic beverages in social gatherings and places where there may be alcohol. Activity  Spend more time on activities that you enjoy that do   not involve alcohol, like hobbies or exercise.  Find healthy ways to cope with stress, such as exercise, meditation, or spending time with people you care about. General information  Talk to your family, coworkers, and friends about supporting you in your efforts to stop drinking. If they drink, ask them not to drink around you. Spend more time with people who do not drink alcohol.  If you think that you have an alcohol dependency problem: ? Tell friends or family about your concerns. ? Talk with your  health care provider or another health professional about where to get help. ? Work with a Transport planner and a Regulatory affairs officer. ? Consider joining a support group for people who struggle with alcohol abuse and dependence. Where to find support  Your health care provider.  SMART Recovery: www.smartrecovery.org Therapy and support groups  Local treatment centers or chemical dependency counselors.  Local AA groups in your community: [employer].com.pt   Where to find more information  Centers for Disease Control and Prevention: http://www.wolf.info/  National Institute on Alcohol Abuse and Alcoholism: http://www.bradshaw.com/  Alcoholics Anonymous (AA): [employer].com.pt Contact a health care provider if:  You drank more or for longer than you intended on more than one occasion.  You tried to stop drinking or to cut back on how much you drink, but you were not able to.  You often drink to the point of vomiting or passing out.  You want to drink so badly that you cannot think about anything else.  You have problems in your life due to drinking, but you continue to drink.  You keep drinking even though you feel anxious, depressed, or have experienced memory loss.  You have stopped doing the things you used to enjoy in order to drink.  You have to drink more than you used to in order to get the effect you want.  You experience anxiety, sweating, nausea, shakiness, and trouble sleeping when you try to stop drinking. Get help right away if:  You have thoughts about hurting yourself or others.  You have serious withdrawal symptoms, including: ? Confusion. ? Racing heart. ? High blood pressure. ? Fever. If you ever feel like you may hurt yourself or others, or have thoughts about taking your own life, get help right away. You can go to your nearest emergency department or call:  Your local emergency services (911 in the [place]  A suicide crisis helpline, such as the [place] at (614) 505-2892. This is open 24 hours a day. Summary  Alcohol abuse and dependence can have a negative effect on your life. Drinking too much or too often can lead to addiction.  If you drink alcohol, limit how much you use.  If you are having trouble keeping your drinking under control, find ways to change your behavior. Hobbies, calming activities, exercise, or support groups can help.  If you feel you need help with changing your drinking habits, talk with your health care provider, a good friend, or a therapist, or go to an [place] group. This information is not intended to replace advice given to you by your health care provider. Make sure you discuss any questions you have with your health care provider. Document Revised: 09/15/2018 Document Reviewed: 08/04/2018 Elsevier Patient Education  [place].

## 2020-09-14 ENCOUNTER — Other Ambulatory Visit: Payer: Self-pay

## 2020-09-17 ENCOUNTER — Emergency Department: Payer: Self-pay

## 2020-09-17 ENCOUNTER — Emergency Department
Admission: EM | Admit: 2020-09-17 | Discharge: 2020-09-17 | Disposition: A | Discharge status: Home / Self Care | Payer: Self-pay | Attending: Emergency Medicine | Admitting: Emergency Medicine

## 2020-09-17 ENCOUNTER — Other Ambulatory Visit: Payer: Self-pay

## 2020-09-17 ENCOUNTER — Encounter: Payer: Self-pay | Admitting: Emergency Medicine

## 2020-09-17 DIAGNOSIS — Z87891 Personal history of nicotine dependence: Secondary | ICD-10-CM | POA: Insufficient documentation

## 2020-09-17 DIAGNOSIS — Z79899 Other long term (current) drug therapy: Secondary | ICD-10-CM | POA: Insufficient documentation

## 2020-09-17 DIAGNOSIS — Z8547 Personal history of malignant neoplasm of testis: Secondary | ICD-10-CM | POA: Insufficient documentation

## 2020-09-17 DIAGNOSIS — J4521 Mild intermittent asthma with (acute) exacerbation: Secondary | ICD-10-CM | POA: Insufficient documentation

## 2020-09-17 DIAGNOSIS — R0789 Other chest pain: Secondary | ICD-10-CM | POA: Insufficient documentation

## 2020-09-17 DIAGNOSIS — R000 Tachycardia, unspecified: Secondary | ICD-10-CM | POA: Insufficient documentation

## 2020-09-17 DIAGNOSIS — R0602 Shortness of breath: Secondary | ICD-10-CM

## 2020-09-17 LAB — CBC WITH DIFFERENTIAL/PLATELET
Abs Immature Granulocytes: 0.05 10*3/uL (ref 0.00–0.07)
Basophils Absolute: 0 10*3/uL (ref 0.0–0.1)
Basophils Relative: 0 %
Eosinophils Absolute: 0.6 10*3/uL — ABNORMAL HIGH (ref 0.0–0.5)
Eosinophils Relative: 6 %
HCT: 46.3 % (ref 39.0–52.0)
Hemoglobin: 15.6 g/dL (ref 13.0–17.0)
Immature Granulocytes: 1 %
Lymphocytes Relative: 11 %
Lymphs Abs: 1.2 10*3/uL (ref 0.7–4.0)
MCH: 30.6 pg (ref 26.0–34.0)
MCHC: 33.7 g/dL (ref 30.0–36.0)
MCV: 91 fL (ref 80.0–100.0)
Monocytes Absolute: 1.3 10*3/uL — ABNORMAL HIGH (ref 0.1–1.0)
Monocytes Relative: 12 %
Neutro Abs: 7.4 10*3/uL (ref 1.7–7.7)
Neutrophils Relative %: 70 %
Platelets: 270 10*3/uL (ref 150–400)
RBC: 5.09 MIL/uL (ref 4.22–5.81)
RDW: 14.2 % (ref 11.5–15.5)
WBC: 10.5 10*3/uL (ref 4.0–10.5)
nRBC: 0 % (ref 0.0–0.2)

## 2020-09-17 LAB — COMPREHENSIVE METABOLIC PANEL
ALT: 30 U/L (ref 0–44)
AST: 28 U/L (ref 15–41)
Albumin: 4 g/dL (ref 3.5–5.0)
Alkaline Phosphatase: 54 U/L (ref 38–126)
Anion gap: 7 (ref 5–15)
BUN: 12 mg/dL (ref 6–20)
CO2: 21 mmol/L — ABNORMAL LOW (ref 22–32)
Calcium: 9.1 mg/dL (ref 8.9–10.3)
Chloride: 107 mmol/L (ref 98–111)
Creatinine, Ser: 1.21 mg/dL (ref 0.61–1.24)
GFR, Estimated: >60 mL/min (ref >60)
Glucose, Bld: 106 mg/dL — ABNORMAL HIGH (ref 70–99)
Potassium: 4.4 mmol/L (ref 3.5–5.1)
Sodium: 135 mmol/L (ref 135–145)
Total Bilirubin: 1.2 mg/dL (ref 0.3–1.2)
Total Protein: 7.4 g/dL (ref 6.5–8.1)

## 2020-09-17 LAB — PROTIME-INR
INR: 1 (ref 0.8–1.2)
Prothrombin Time: 12.8 seconds (ref 11.4–15.2)

## 2020-09-17 LAB — APTT: aPTT: 36 seconds (ref 24–36)

## 2020-09-17 MED ORDER — HYDROCORTISONE NA SUCCINATE PF 250 MG IJ SOLR
200.0000 mg | Freq: Once | INTRAMUSCULAR | Status: AC
  Administered 2020-09-17: 200 mg via INTRAVENOUS
  Filled 2020-09-17: qty 200

## 2020-09-17 MED ORDER — DIPHENHYDRAMINE HCL 50 MG/ML IJ SOLN
50.0000 mg | Freq: Once | INTRAMUSCULAR | Status: AC
  Administered 2020-09-17: 50 mg via INTRAVENOUS
  Filled 2020-09-17: qty 1

## 2020-09-17 MED ORDER — DIPHENHYDRAMINE HCL 25 MG PO CAPS: 50.0000 mg | ORAL_CAPSULE | Freq: Once | ORAL | Status: AC

## 2020-09-17 MED ORDER — PREDNISONE 10 MG PO TABS
ORAL_TABLET | ORAL | 0 refills | Status: DC
  Filled 2020-09-17: qty 21, 6d supply, fill #0

## 2020-09-17 MED ORDER — KETOROLAC TROMETHAMINE 30 MG/ML IJ SOLN
30.0000 mg | Freq: Once | INTRAMUSCULAR | Status: AC
  Administered 2020-09-17: 30 mg via INTRAVENOUS
  Filled 2020-09-17: qty 1

## 2020-09-17 MED ORDER — IPRATROPIUM-ALBUTEROL 0.5-2.5 (3) MG/3ML IN SOLN
6.0000 mL | Freq: Once | RESPIRATORY_TRACT | Status: AC
  Administered 2020-09-17: 6 mL via RESPIRATORY_TRACT
  Filled 2020-09-17: qty 6

## 2020-09-17 MED ORDER — IOHEXOL 350 MG/ML SOLN
75.0000 mL | Freq: Once | INTRAVENOUS | Status: AC | PRN
  Administered 2020-09-17: 75 mL via INTRAVENOUS

## 2020-09-17 MED ORDER — IPRATROPIUM-ALBUTEROL 0.5-2.5 (3) MG/3ML IN SOLN
3.0000 mL | Freq: Once | RESPIRATORY_TRACT | Status: AC
  Administered 2020-09-17: 3 mL via RESPIRATORY_TRACT
  Filled 2020-09-17: qty 3

## 2020-09-17 MED ORDER — FLUTICASONE-SALMETEROL 100-50 MCG/DOSE IN AEPB
1.0000 | INHALATION_SPRAY | Freq: Two times a day (BID) | RESPIRATORY_TRACT | 2 refills | Status: DC
  Filled 2020-09-17: qty 60, 30d supply, fill #0

## 2020-09-17 NOTE — ED Provider Notes (Signed)
[facility] Emergency Department Provider Note   ____________________________________________   Event Date/Time   First MD Initiated Contact with Patient 09/17/20 [phone]     (approximate)  I have reviewed the triage vital signs and the nursing notes.   HISTORY  Chief Complaint Shortness of Breath    HPI Shane Leach is a 35 y.o. male with a past medical history of asthma, seizures, and pulmonary embolisms found 2 months prior to arrival who presents for worsening chest pain/tightness, and shortness of breath.  Patient states that this shortness of breath and chest tightness is worse when he wakes up in the morning or after laying flat.  Patient states that he uses albuterol to try to control his shortness of breath but does not use any daily controller medications/inhalers.  Patient states that he is on Eliquis for these blood clots and has not missed a dose but does not feel that they are improving his symptoms.  Patient currently denies any vision changes, tinnitus, difficulty speaking, facial droop, sore throat, chest pain, shortness of breath, abdominal pain, nausea/vomiting/diarrhea, dysuria, or weakness/numbness/paresthesias in any extremity      Past Medical History:  Diagnosis Date  . Asthma   . Bronchitis   . Cancer [facility])    Testicular cancer as a child  . Epilepsy ([place])   . EtOH dependence ([place])    In remission  . Pulmonary embolism ([place]) 06/19/2020  . Seizures [facility])     Patient Active Problem List   Diagnosis Date Noted  . Increased heart rate 09/17/2020  . History of seizure 09/13/2020  . Cough 09/13/2020  . Abnormal laboratory test result 09/13/2020  . Pulmonary embolism on right ([place]) 08/01/2020  . Alcohol dependence ([place]) 08/01/2020  . Asthma 11/17/2016    Past Surgical History:  Procedure Laterality Date  . testicular cancer     right testicle removed    Prior to Admission medications   Medication Sig Start Date End  Date Taking? Authorizing Provider  Fluticasone-Salmeterol (ADVAIR DISKUS) 100-50 MCG/DOSE AEPB Inhale 1 puff into the lungs in the morning and at bedtime. 09/17/20 09/17/21 Yes Reanne Nellums, Vista Lawman, MD  predniSONE (DELTASONE) 10 MG tablet Take 6 tablets by mouth once daily and decrease each day by 1 tablet untill finished 09/17/20  Yes Yoshiaki Kreuser, Vista Lawman, MD  albuterol (PROVENTIL) (2.5 MG/3ML) 0.083% nebulizer solution USE 1 VIAL VIA NEBULIZER EVERY 6 HOURS AS NEEDED FOR WHEEZING OR SHORTNESS OF BREATH 08/02/20 08/02/21  Fritzi Mandes, MD  albuterol (VENTOLIN HFA) 108 (90 Base) MCG/ACT inhaler INHALE 2 PUFFS INTO THE LUNGS EVERY 6 HOURS AS NEEDED FOR WHEEZING OR SHORT OF BREATH 09/13/20 09/13/21  Iloabachie, Chioma E, NP  apixaban (ELIQUIS) 5 MG TABS tablet Take 1 tablet (5 mg total) by mouth 2 (two) times daily. 08/28/20   Cammie Sickle, MD  benzonatate (TESSALON PERLES) 100 MG capsule Take 1 capsule (100 mg total) by mouth 2 (two) times daily as needed for cough. 09/13/20   Iloabachie, Chioma E, NP  levETIRAcetam (KEPPRA) 1000 MG tablet TAKE ONE TABLET BY MOUTH 2 TIMES A DAY 08/02/20 08/02/21  Fritzi Mandes, MD  ipratropium-albuterol (DUONEB) 0.5-2.5 (3) MG/3ML SOLN Take 3 mLs by nebulization every 4 (four) hours as needed. 12/15/19 01/12/20  Versie Starks, PA-C    Allergies Contrast media [iodinated diagnostic agents], Dilantin [phenytoin sodium extended], Penicillins, and Tegretol [carbamazepine]  Family History  Problem Relation Age of Onset  . Stroke Mother   . Deep vein thrombosis  Mother   . Diabetes Mother   . Hypertension Mother   . Cancer Father     Social History Social History   Tobacco Use  . Smoking status: Former Smoker    Packs/day: 0.50    Years: 15.00    Pack years: 7.50    Types: Cigarettes    Quit date: 07/10/2020    Years since quitting: 0.1  . Smokeless tobacco: Never Used  Substance Use Topics  . Alcohol use: Yes    Alcohol/week: 2.0 standard drinks    Types: 2 Cans of beer  per week    Comment: drinks 2 16 oz cans of beer weekly  . Drug use: Not Currently    Types: Marijuana    Comment: used marijuana in the past    Review of Systems Constitutional: No fever/chills Eyes: No visual changes. ENT: No sore throat. Cardiovascular: Endorses chest pain. Respiratory: Endorses shortness of breath. Gastrointestinal: No abdominal pain.  No nausea, no vomiting.  No diarrhea. Genitourinary: Negative for dysuria. Musculoskeletal: Negative for acute arthralgias Skin: Negative for rash. Neurological: Negative for headaches, weakness/numbness/paresthesias in any extremity Psychiatric: Negative for suicidal ideation/homicidal ideation   ____________________________________________   PHYSICAL EXAM:  VITAL SIGNS: ED Triage Vitals  Enc Vitals Group     BP 09/17/20 0714 106/67     Pulse Rate 09/17/20 0707 (!) 103     Resp 09/17/20 0707 (!) 24     Temp 09/17/20 0714 97.9 F (36.6 C)     Temp Source 09/17/20 0714 Oral     SpO2 09/17/20 0707 95 %     Weight 09/17/20 0708 246 lb (111.6 kg)     Height 09/17/20 0708 5\' 11"  (1.803 m)     Head Circumference --      Peak Flow --      Pain Score 09/17/20 0707 10     Pain Loc --      Pain Edu? --      Excl. in [place]? --    Constitutional: Alert and oriented. Well appearing and in no acute distress. Eyes: Conjunctivae are normal. PERRL. Head: Atraumatic. Nose: No congestion/rhinnorhea. Mouth/Throat: Mucous membranes are moist. Neck: No stridor Cardiovascular: Grossly normal heart sounds.  Good peripheral circulation. Respiratory: Increased respiratory effort.  Inspiratory and expiratory wheezes over bilateral lung fields.  No retractions. Gastrointestinal: Soft and nontender. No distention. Musculoskeletal: No obvious deformities Neurologic:  Normal speech and language. No gross focal neurologic deficits are appreciated. Skin:  Skin is warm and dry. No rash noted. Psychiatric: Mood and affect are normal. Speech and  behavior are normal.  ____________________________________________   LABS (all labs ordered are listed, but only abnormal results are displayed)  Labs Reviewed  CBC WITH DIFFERENTIAL/PLATELET - Abnormal; Notable for the following components:      Result Value   Monocytes Absolute 1.3 (*)    Eosinophils Absolute 0.6 (*)    All other components within normal limits  COMPREHENSIVE METABOLIC PANEL - Abnormal; Notable for the following components:   CO2 21 (*)    Glucose, Bld 106 (*)    All other components within normal limits  APTT  PROTIME-INR   ____________________________________________  EKG  ED ECG REPORT I, Naaman Plummer, the attending physician, personally viewed and interpreted this ECG.  Date: 09/17/2020 EKG Time: 0711 Rate: 104 Rhythm: Tachycardic sinus rhythm QRS Axis: normal Intervals: normal ST/T Wave abnormalities: normal Narrative Interpretation: no evidence of acute ischemia  ____________________________________________  RADIOLOGY  ED MD interpretation: 2 view chest  x-ray shows increased right pleural effusion in the setting of right lower lobe pulmonary infarct that was found on February 2022  Official radiology report(s): No results found.  ____________________________________________   PROCEDURES  Procedure(s) performed (including Critical Care):  .1-3 Lead EKG Interpretation Performed by: Naaman Plummer, MD Authorized by: Naaman Plummer, MD     Interpretation: normal     ECG rate:  84   ECG rate assessment: normal     Rhythm: sinus rhythm     Ectopy: none     Conduction: normal       ____________________________________________   INITIAL IMPRESSION / ASSESSMENT AND PLAN / ED COURSE  As part of my medical decision making, I reviewed the following data within the [place] notes reviewed and incorporated, Labs reviewed, EKG interpreted, Old chart reviewed, Radiograph reviewed and Notes from prior ED  visits reviewed and incorporated        Presentation most consistent with acute asthma exacerbation.  Presentation less concerning for pneumonia, heart failure, foreign body airway obstruction, pulmonary embolism, tamponade, atypical ACS  Reassuring factors: No AMS, silent respirations, belly-breathing, or other sign of impending ventilatory failure. Never intubated or admitted to the hospital for asthma exacerbation.  Workup Defer labs and imaging given clinically in exacerbation of known asthma with similar exacerbation presentations per patient. CT PE shows no evidence of pulmonary emboli Reassessment: Patient respiratory status improved with breathing treatment.  Disposition: Discharge home with return precautions. Advised to follow up with primary care physician within next 24-48 hours.      ____________________________________________   FINAL CLINICAL IMPRESSION(S) / ED DIAGNOSES  Final diagnoses:  Shortness of breath  Mild intermittent asthma with exacerbation  Chest tightness     ED Discharge Orders         Ordered    Fluticasone-Salmeterol (ADVAIR DISKUS) 100-50 MCG/DOSE AEPB  2 times daily        09/17/20 1417    predniSONE (DELTASONE) 10 MG tablet        09/17/20 1417           Note:  This document was prepared using Dragon voice recognition software and may include unintentional dictation errors.   Naaman Plummer, MD 09/18/20 [phone]

## 2020-09-17 NOTE — ED Triage Notes (Signed)
Pt to ED via POV stating that he is having increased shortness of breath, back and chest pain. Pt states that this has been ongoing since he was diagnosed with PEs 2 months ago but has gotten worse over the last 3 days. Pt states that he is taking blood thinners, and he has not missed any doses. Pt appears visibly short of breath.

## 2020-09-17 NOTE — ED Notes (Signed)
Solu cortef requested from pharmacy

## 2020-09-17 NOTE — ED Notes (Signed)
IV catheter removed intact without complication.  D/C instructions given.  Advised of follow up and RX's.  All questions addressed.  Pt instructed not to drive.  Understanding verbalized.  Pt picked up by wife.

## 2020-09-18 ENCOUNTER — Other Ambulatory Visit: Payer: Self-pay

## 2020-09-19 LAB — PROTHROMBIN GENE MUTATION

## 2020-09-19 LAB — FACTOR 5 LEIDEN

## 2020-09-20 ENCOUNTER — Other Ambulatory Visit: Payer: Self-pay

## 2020-10-10 ENCOUNTER — Other Ambulatory Visit: Payer: Self-pay

## 2020-10-10 MED FILL — Albuterol Sulfate Soln Nebu 0.083% (2.5 MG/3ML): RESPIRATORY_TRACT | 30 days supply | Qty: 90 | Fill #0 | Status: AC

## 2020-10-25 ENCOUNTER — Telehealth: Payer: Self-pay | Admitting: Pharmacist

## 2020-10-25 ENCOUNTER — Other Ambulatory Visit: Payer: Self-pay

## 2020-10-25 MED FILL — Albuterol Sulfate Soln Nebu 0.083% (2.5 MG/3ML): RESPIRATORY_TRACT | 30 days supply | Qty: 90 | Fill #1 | Status: AC

## 2020-10-25 NOTE — Telephone Encounter (Signed)
10/25/2020 4:11:36 PM - ProAir app. mailed to [facility] - Wednesday, Oct 25, 2020 4:09 PM --Received a pharmacy printout from [employer] [facility] for ProAir (replaces Ventolin HFA) Inhale 2 puffs into the lungs every 6 hours as needed for wheezing or shortness of breath. Printed [employer] application-Mailing patient his portion to sign & return, putting Eliz. in [facility] folder.

## 2020-10-26 ENCOUNTER — Emergency Department
Admission: EM | Admit: 2020-10-26 | Discharge: 2020-10-26 | Disposition: A | Discharge status: Home / Self Care | Payer: Medicaid Other | Attending: Emergency Medicine | Admitting: Emergency Medicine

## 2020-10-26 ENCOUNTER — Other Ambulatory Visit: Payer: Self-pay

## 2020-10-26 ENCOUNTER — Emergency Department: Payer: Medicaid Other

## 2020-10-26 DIAGNOSIS — R0602 Shortness of breath: Secondary | ICD-10-CM | POA: Insufficient documentation

## 2020-10-26 DIAGNOSIS — Z87891 Personal history of nicotine dependence: Secondary | ICD-10-CM | POA: Insufficient documentation

## 2020-10-26 DIAGNOSIS — R0789 Other chest pain: Secondary | ICD-10-CM | POA: Insufficient documentation

## 2020-10-26 DIAGNOSIS — R062 Wheezing: Secondary | ICD-10-CM

## 2020-10-26 DIAGNOSIS — R079 Chest pain, unspecified: Secondary | ICD-10-CM | POA: Diagnosis not present

## 2020-10-26 DIAGNOSIS — Z7951 Long term (current) use of inhaled steroids: Secondary | ICD-10-CM | POA: Insufficient documentation

## 2020-10-26 DIAGNOSIS — R059 Cough, unspecified: Secondary | ICD-10-CM | POA: Insufficient documentation

## 2020-10-26 DIAGNOSIS — R202 Paresthesia of skin: Secondary | ICD-10-CM | POA: Insufficient documentation

## 2020-10-26 DIAGNOSIS — R069 Unspecified abnormalities of breathing: Secondary | ICD-10-CM | POA: Diagnosis not present

## 2020-10-26 DIAGNOSIS — R000 Tachycardia, unspecified: Secondary | ICD-10-CM | POA: Diagnosis not present

## 2020-10-26 DIAGNOSIS — R002 Palpitations: Secondary | ICD-10-CM | POA: Insufficient documentation

## 2020-10-26 DIAGNOSIS — J45909 Unspecified asthma, uncomplicated: Secondary | ICD-10-CM | POA: Insufficient documentation

## 2020-10-26 DIAGNOSIS — Z7901 Long term (current) use of anticoagulants: Secondary | ICD-10-CM | POA: Insufficient documentation

## 2020-10-26 DIAGNOSIS — Z8547 Personal history of malignant neoplasm of testis: Secondary | ICD-10-CM | POA: Insufficient documentation

## 2020-10-26 LAB — HEPATIC FUNCTION PANEL
ALT: 38 U/L (ref 0–44)
AST: 37 U/L (ref 15–41)
Albumin: 3.9 g/dL (ref 3.5–5.0)
Alkaline Phosphatase: 64 U/L (ref 38–126)
Bilirubin, Direct: <0.1 mg/dL (ref 0.0–0.2)
Total Bilirubin: 0.8 mg/dL (ref 0.3–1.2)
Total Protein: 7 g/dL (ref 6.5–8.1)

## 2020-10-26 LAB — BASIC METABOLIC PANEL
Anion gap: 12 (ref 5–15)
BUN: 9 mg/dL (ref 6–20)
CO2: 21 mmol/L — ABNORMAL LOW (ref 22–32)
Calcium: 9.5 mg/dL (ref 8.9–10.3)
Chloride: 104 mmol/L (ref 98–111)
Creatinine, Ser: 1.1 mg/dL (ref 0.61–1.24)
GFR, Estimated: >60 mL/min (ref >60)
Glucose, Bld: 96 mg/dL (ref 70–99)
Potassium: 4.4 mmol/L (ref 3.5–5.1)
Sodium: 137 mmol/L (ref 135–145)

## 2020-10-26 LAB — TROPONIN I (HIGH SENSITIVITY)
Troponin I (High Sensitivity): 7 ng/L (ref <18)
Troponin I (High Sensitivity): 6 ng/L (ref <18)

## 2020-10-26 LAB — CBC
HCT: 46.3 % (ref 39.0–52.0)
Hemoglobin: 15.7 g/dL (ref 13.0–17.0)
MCH: 30.8 pg (ref 26.0–34.0)
MCHC: 33.9 g/dL (ref 30.0–36.0)
MCV: 91 fL (ref 80.0–100.0)
Platelets: 251 10*3/uL (ref 150–400)
RBC: 5.09 MIL/uL (ref 4.22–5.81)
RDW: 14.7 % (ref 11.5–15.5)
WBC: 8.7 10*3/uL (ref 4.0–10.5)
nRBC: 0 % (ref 0.0–0.2)

## 2020-10-26 LAB — BRAIN NATRIURETIC PEPTIDE: B Natriuretic Peptide: 22.7 pg/mL (ref 0.0–100.0)

## 2020-10-26 MED ORDER — MORPHINE SULFATE (PF) 4 MG/ML IV SOLN
6.0000 mg | Freq: Once | INTRAVENOUS | Status: AC
  Administered 2020-10-26: 6 mg via INTRAVENOUS
  Filled 2020-10-26: qty 2

## 2020-10-26 MED ORDER — IPRATROPIUM-ALBUTEROL 0.5-2.5 (3) MG/3ML IN SOLN
3.0000 mL | Freq: Once | RESPIRATORY_TRACT | Status: AC
  Administered 2020-10-26: 3 mL via RESPIRATORY_TRACT
  Filled 2020-10-26: qty 3

## 2020-10-26 MED ORDER — DOXYCYCLINE HYCLATE 100 MG PO CAPS
100.0000 mg | ORAL_CAPSULE | Freq: Two times a day (BID) | ORAL | 0 refills | Status: AC
  Filled 2020-10-26: qty 14, 7d supply, fill #0

## 2020-10-26 MED ORDER — ALBUTEROL SULFATE HFA 108 (90 BASE) MCG/ACT IN AERS
2.0000 | INHALATION_SPRAY | RESPIRATORY_TRACT | 0 refills | Status: DC | PRN
  Filled 2020-10-26: qty 6.7, 30d supply, fill #0
  Filled 2020-11-10: qty 6.7, 17d supply, fill #0

## 2020-10-26 MED ORDER — PREDNISONE 20 MG PO TABS
40.0000 mg | ORAL_TABLET | Freq: Every day | ORAL | 0 refills | Status: DC
  Filled 2020-10-26: qty 10, 5d supply, fill #0

## 2020-10-26 MED ORDER — ALBUTEROL SULFATE HFA 108 (90 BASE) MCG/ACT IN AERS: 2.0000 | INHALATION_SPRAY | RESPIRATORY_TRACT | 0 refills | Status: DC | PRN

## 2020-10-26 MED ORDER — ALBUTEROL SULFATE (2.5 MG/3ML) 0.083% IN NEBU
2.5000 mg | INHALATION_SOLUTION | RESPIRATORY_TRACT | 0 refills | Status: DC | PRN
  Filled 2020-10-26: qty 75, 5d supply, fill #0
  Filled 2020-10-30: qty 90, 5d supply, fill #0

## 2020-10-26 MED ORDER — HYDROCODONE-ACETAMINOPHEN 5-325 MG PO TABS: 1.0000 | ORAL_TABLET | Freq: Four times a day (QID) | ORAL | 0 refills | Status: DC | PRN

## 2020-10-26 MED ORDER — ONDANSETRON HCL 4 MG/2ML IJ SOLN
4.0000 mg | Freq: Once | INTRAMUSCULAR | Status: AC
  Administered 2020-10-26: 4 mg via INTRAVENOUS
  Filled 2020-10-26: qty 2

## 2020-10-26 MED ORDER — APIXABAN 5 MG PO TABS
5.0000 mg | ORAL_TABLET | Freq: Once | ORAL | Status: AC
  Administered 2020-10-26: 5 mg via ORAL
  Filled 2020-10-26: qty 1

## 2020-10-26 MED ORDER — METHYLPREDNISOLONE SODIUM SUCC 125 MG IJ SOLR
125.0000 mg | Freq: Once | INTRAMUSCULAR | Status: AC
  Administered 2020-10-26: 125 mg via INTRAVENOUS
  Filled 2020-10-26: qty 2

## 2020-10-26 NOTE — ED Triage Notes (Addendum)
Pt arrived via ems. C/o CP starting this morning around 0830 left chest radiating through to left back and shoulder. Also c/o sob that has been going on around 5-6 months. Pt reports being diagnosed with a PE around 6 months ago. Pt take eliquis for PE. BP elevated in triage. NAD noted at this time. Currently taking steroid for lungs

## 2020-10-26 NOTE — ED Provider Notes (Signed)
[facility] Emergency Department Provider Note  ____________________________________________   Event Date/Time   First MD Initiated Contact with Patient 10/26/20 1506     (approximate)  I have reviewed the triage vital signs and the nursing notes.   HISTORY  Chief Complaint Chest Pain and Shortness of Breath    HPI Shane Leach is a 35 y.o. male with past medical history of PE, COPD/asthma, here with cough and shortness of breath as well as chest pressure.  Patient states that over the last week or so, he has had progressive worsening cough, wheezing, and shortness of breath.  Said shortness of breath since diagnosis of a PE months ago, but has been taking his blood thinner as prescribed.  He reports that over the last several days, his cough and wheezing is worsened.  Today, while returning home, he began to feel more short of breath and felt a mild chest pressure.  He felt tingling in his hands and feet.  He felt like his heart was racing quickly.  He was trying to use his inhaler without significant relief of his shortness of breath.  He now feels persistent shortness of breath but his chest pain is resolved.  No history of ACS.  No specific alleviating factors other than his inhaler.  No worsening factors.  No fevers or chills.  He has had some mild sputum production.        Past Medical History:  Diagnosis Date  . Asthma   . Bronchitis   . Cancer [facility])    Testicular cancer as a child  . Epilepsy ([place])   . EtOH dependence ([place])    In remission  . Pulmonary embolism ([place]) 06/19/2020  . Seizures [facility])     Patient Active Problem List   Diagnosis Date Noted  . Increased heart rate 09/17/2020  . History of seizure 09/13/2020  . Cough 09/13/2020  . Abnormal laboratory test result 09/13/2020  . Pulmonary embolism on right ([place]) 08/01/2020  . Alcohol dependence ([place]) 08/01/2020  . Asthma 11/17/2016    Past Surgical History:  Procedure  Laterality Date  . testicular cancer     right testicle removed    Prior to Admission medications   Medication Sig Start Date End Date Taking? Authorizing Provider  albuterol (PROVENTIL) (2.5 MG/3ML) 0.083% nebulizer solution Take 3 mLs (2.5 mg total) by nebulization every 4 (four) hours as needed for wheezing or shortness of breath. 10/26/20 10/26/21 Yes Duffy Bruce, MD  doxycycline (VIBRAMYCIN) 100 MG capsule Take 1 capsule (100 mg total) by mouth 2 (two) times daily for 7 days. 10/26/20 11/02/20 Yes Duffy Bruce, MD  predniSONE (DELTASONE) 20 MG tablet Take 2 tablets (40 mg total) by mouth daily for 5 days. 10/26/20 10/31/20 Yes Duffy Bruce, MD  albuterol (VENTOLIN HFA) 108 (90 Base) MCG/ACT inhaler Inhale 2 puffs into the lungs every 4 (four) hours as needed for wheezing or shortness of breath. 10/26/20   Duffy Bruce, MD  apixaban (ELIQUIS) 5 MG TABS tablet Take 1 tablet (5 mg total) by mouth 2 (two) times daily. 08/28/20   Cammie Sickle, MD  benzonatate (TESSALON PERLES) 100 MG capsule Take 1 capsule (100 mg total) by mouth 2 (two) times daily as needed for cough. 09/13/20   Iloabachie, Chioma E, NP  Fluticasone-Salmeterol (ADVAIR DISKUS) 100-50 MCG/DOSE AEPB Inhale 1 puff into the lungs in the morning and at bedtime. 09/17/20 09/17/21  Naaman Plummer, MD  HYDROcodone-acetaminophen (NORCO/VICODIN) 5-325 MG tablet Take 1-2  tablets by mouth every 6 (six) hours as needed for moderate pain or severe pain. 10/26/20 10/26/21  Duffy Bruce, MD  levETIRAcetam (KEPPRA) 1000 MG tablet TAKE ONE TABLET BY MOUTH 2 TIMES A DAY 08/02/20 08/02/21  Fritzi Mandes, MD  ipratropium-albuterol (DUONEB) 0.5-2.5 (3) MG/3ML SOLN Take 3 mLs by nebulization every 4 (four) hours as needed. 12/15/19 01/12/20  Versie Starks, PA-C    Allergies Contrast media [iodinated diagnostic agents], Dilantin [phenytoin sodium extended], Penicillins, and Tegretol [carbamazepine]  Family History  Problem Relation Age of  Onset  . Stroke Mother   . Deep vein thrombosis Mother   . Diabetes Mother   . Hypertension Mother   . Cancer Father     Social History Social History   Tobacco Use  . Smoking status: Former Smoker    Packs/day: 0.50    Years: 15.00    Pack years: 7.50    Types: Cigarettes    Quit date: 07/10/2020    Years since quitting: 0.2  . Smokeless tobacco: Never Used  Substance Use Topics  . Alcohol use: Yes    Alcohol/week: 2.0 standard drinks    Types: 2 Cans of beer per week    Comment: drinks 2 16 oz cans of beer weekly  . Drug use: Not Currently    Types: Marijuana    Comment: used marijuana in the past    Review of Systems  Review of Systems  Constitutional: Positive for fatigue. Negative for chills and fever.  HENT: Negative for sore throat.   Respiratory: Positive for cough, shortness of breath and wheezing.   Cardiovascular: Positive for chest pain.  Gastrointestinal: Negative for abdominal pain.  Genitourinary: Negative for flank pain.  Musculoskeletal: Negative for neck pain.  Skin: Negative for rash and wound.  Allergic/Immunologic: Negative for immunocompromised state.  Neurological: Positive for weakness. Negative for numbness.  Hematological: Does not bruise/bleed easily.  All other systems reviewed and are negative.    ____________________________________________  PHYSICAL EXAM:      VITAL SIGNS: ED Triage Vitals  Enc Vitals Group     BP 10/26/20 1203 (!) 154/108     Pulse Rate 10/26/20 1203 (!) 101     Resp 10/26/20 1203 20     Temp 10/26/20 1203 98.5 F (36.9 C)     Temp src --      SpO2 10/26/20 1203 95 %     Weight 10/26/20 1207 255 lb (115.7 kg)     Height 10/26/20 1207 5\' 11"  (1.803 m)     Head Circumference --      Peak Flow --      Pain Score 10/26/20 1206 6     Pain Loc --      Pain Edu? --      Excl. in [place]? --      Physical Exam Vitals and nursing note reviewed.  Constitutional:      General: He is not in acute distress.     Appearance: He is well-developed.  HENT:     Head: Normocephalic and atraumatic.  Eyes:     Conjunctiva/sclera: Conjunctivae normal.  Cardiovascular:     Rate and Rhythm: Normal rate and regular rhythm.     Heart sounds: Normal heart sounds. No murmur heard. No friction rub.  Pulmonary:     Effort: Pulmonary effort is normal. No respiratory distress.     Breath sounds: Examination of the right-upper field reveals wheezing. Examination of the left-upper field reveals wheezing. Examination of the right-middle  field reveals wheezing. Examination of the left-middle field reveals wheezing. Examination of the right-lower field reveals wheezing. Examination of the left-lower field reveals wheezing. Decreased breath sounds and wheezing present. No rales.  Abdominal:     General: There is no distension.     Palpations: Abdomen is soft.     Tenderness: There is no abdominal tenderness.  Musculoskeletal:     Cervical back: Neck supple.  Skin:    General: Skin is warm.     Capillary Refill: Capillary refill takes less than 2 seconds.  Neurological:     Mental Status: He is alert and oriented to person, place, and time.     Motor: No abnormal muscle tone.       ____________________________________________   LABS (all labs ordered are listed, but only abnormal results are displayed)  Labs Reviewed  BASIC METABOLIC PANEL - Abnormal; Notable for the following components:      Result Value   CO2 21 (*)    All other components within normal limits  CBC  HEPATIC FUNCTION PANEL  BRAIN NATRIURETIC PEPTIDE  TROPONIN I (HIGH SENSITIVITY)  TROPONIN I (HIGH SENSITIVITY)    ____________________________________________  EKG: Sinus tachycardia, ventricular rate 109.  PR 126, QRS 90, QTc 420.  No acute ST elevations or depressions. ________________________________________  RADIOLOGY All imaging, including plain films, CT scans, and ultrasounds, independently reviewed by me, and interpretations  confirmed via formal radiology reads.  ED MD interpretation:   Chest x-ray: Small right effusion with basilar atelectasis  Official radiology report(s): DG Chest 2 View  Result Date: 10/26/2020 CLINICAL DATA:  Chest pain, palpitations, numbness, and tingling in the hands EXAM: CHEST - 2 VIEW COMPARISON:  Radiograph 09/17/2020, chest CT 09/17/2020 FINDINGS: Unchanged cardiomediastinal silhouette. There is a small right pleural effusion with adjacent basilar atelectasis. This pleural effusion is smaller in comparison to prior radiograph in April 2022. There is no visible pneumothorax. IMPRESSION: Small right effusion with adjacent basilar atelectasis, slightly decreased in size in comparison to prior exam. Electronically Signed   By: Maurine Simmering   On: 10/26/2020 13:08    ____________________________________________  PROCEDURES   Procedure(s) performed (including Critical Care):  Procedures  ____________________________________________  INITIAL IMPRESSION / MDM / [place] / ED COURSE  As part of my medical decision making, I reviewed the following data within the [place] notes reviewed and incorporated, Old chart reviewed, Notes from prior ED visits, and [place] Controlled Substance Database       *Shane Leach was evaluated in Emergency Department on 10/26/2020 for the symptoms described in the history of present illness. He was evaluated in the context of the global COVID-19 pandemic, which necessitated consideration that the patient might be at risk for infection with the SARS-CoV-2 virus that causes COVID-19. Institutional protocols and algorithms that pertain to the evaluation of patients at risk for COVID-19 are in a state of rapid change based on information released by regulatory bodies including the CDC and federal and state organizations. These policies and algorithms were followed during the patient's care in the ED.  Some ED evaluations and  interventions may be delayed as a result of limited staffing during the pandemic.*     Medical Decision Making: 35 year old male here with atypical chest pain.  History of PE but he has been compliant with his anticoagulation.  He also has a history of asthma with diffuse wheezing on exam.  Chest x-ray reviewed, he has an effusion which has been noted  since his PE that does not appear acutely changed.  No significant leukocytosis, fever, or signs of sepsis from pneumonia.  Troponin negative, EKG nonischemic and he has no signs of ACS.  Patient does have a known history of PE as mentioned but he has been compliant with his anticoagulation and given stable vitals, no tachycardia or hypoxia, negative troponin and no signs of ischemia or demand, no signs of submassive or strain related to PE so will hold on repeat imaging.  He has no lower extremity signs of DVT.  Otherwise, he feels markedly improved after prednisone and breathing treatments.  Will treat for possible asthma as well as possible pneumonia given his effusion, though I suspect this is more related to his prior PE.  Otherwise, continue current management for his PE.  Return precautions given.  ____________________________________________  FINAL CLINICAL IMPRESSION(S) / ED DIAGNOSES  Final diagnoses:  Wheezing  Atypical chest pain     MEDICATIONS GIVEN DURING THIS VISIT:  Medications  ipratropium-albuterol (DUONEB) 0.5-2.5 (3) MG/3ML nebulizer solution 3 mL (3 mLs Nebulization Given 10/26/20 1640)  ipratropium-albuterol (DUONEB) 0.5-2.5 (3) MG/3ML nebulizer solution 3 mL (3 mLs Nebulization Given 10/26/20 1640)  ipratropium-albuterol (DUONEB) 0.5-2.5 (3) MG/3ML nebulizer solution 3 mL (3 mLs Nebulization Given 10/26/20 1639)  methylPREDNISolone sodium succinate (SOLU-MEDROL) 125 mg/2 mL injection 125 mg (125 mg Intravenous Given 10/26/20 1640)  apixaban (ELIQUIS) tablet 5 mg (5 mg Oral Given 10/26/20 1639)  morphine 4 MG/ML injection 6 mg  (6 mg Intravenous Given 10/26/20 1635)  ondansetron (ZOFRAN) injection 4 mg (4 mg Intravenous Given 10/26/20 1635)     ED Discharge Orders         Ordered    albuterol (PROVENTIL) (2.5 MG/3ML) 0.083% nebulizer solution  Every 4 hours PRN        10/26/20 1810    predniSONE (DELTASONE) 20 MG tablet  Daily        10/26/20 1810    doxycycline (VIBRAMYCIN) 100 MG capsule  2 times daily        10/26/20 1810    HYDROcodone-acetaminophen (NORCO/VICODIN) 5-325 MG tablet  Every 6 hours PRN,   Status:  Discontinued        10/26/20 1810    albuterol (VENTOLIN HFA) 108 (90 Base) MCG/ACT inhaler  Every 4 hours PRN,   Status:  Discontinued        10/26/20 1810    albuterol (VENTOLIN HFA) 108 (90 Base) MCG/ACT inhaler  Every 4 hours PRN        10/26/20 1812    HYDROcodone-acetaminophen (NORCO/VICODIN) 5-325 MG tablet  Every 6 hours PRN        10/26/20 1812           Note:  This document was prepared using Dragon voice recognition software and may include unintentional dictation errors.   Duffy Bruce, MD 10/26/20 1816

## 2020-10-27 ENCOUNTER — Other Ambulatory Visit: Payer: Self-pay

## 2020-10-30 ENCOUNTER — Other Ambulatory Visit: Payer: Self-pay

## 2020-11-10 ENCOUNTER — Other Ambulatory Visit: Payer: Self-pay

## 2020-11-15 ENCOUNTER — Other Ambulatory Visit: Payer: Self-pay

## 2020-11-15 MED ORDER — APIXABAN 5 MG PO TABS
5.0000 mg | ORAL_TABLET | Freq: Two times a day (BID) | ORAL | 2 refills | Status: DC
  Filled 2020-11-15: qty 60, 30d supply, fill #0

## 2020-11-15 MED ORDER — APIXABAN 5 MG PO TABS: 5.0000 mg | ORAL_TABLET | Freq: Two times a day (BID) | ORAL | 2 refills | Status: DC

## 2020-11-15 NOTE — Telephone Encounter (Signed)
Dr. B please advise on refill on eliquis

## 2020-11-16 ENCOUNTER — Telehealth: Payer: Self-pay

## 2020-11-16 ENCOUNTER — Other Ambulatory Visit: Payer: Self-pay | Admitting: Emergency Medicine

## 2020-11-16 ENCOUNTER — Other Ambulatory Visit: Payer: Self-pay

## 2020-11-16 MED ORDER — ALBUTEROL SULFATE (2.5 MG/3ML) 0.083% IN NEBU
2.5000 mg | INHALATION_SOLUTION | RESPIRATORY_TRACT | 0 refills | Status: DC | PRN
  Filled 2020-11-16: qty 90, 5d supply, fill #0

## 2020-11-16 NOTE — Telephone Encounter (Signed)
Patient needs a refill on his Albuterol 2.5 MG/3 ML 0.083% nebulizer solution.  I told patient we would call him if we could refill.

## 2020-11-28 ENCOUNTER — Inpatient Hospital Stay: Payer: Medicaid Other | Attending: Internal Medicine

## 2020-11-28 ENCOUNTER — Other Ambulatory Visit: Payer: Self-pay | Admitting: Internal Medicine

## 2020-11-28 ENCOUNTER — Other Ambulatory Visit: Payer: Self-pay | Admitting: Gerontology

## 2020-11-28 ENCOUNTER — Inpatient Hospital Stay (HOSPITAL_BASED_OUTPATIENT_CLINIC_OR_DEPARTMENT_OTHER): Payer: Medicaid Other | Admitting: Internal Medicine

## 2020-11-28 ENCOUNTER — Encounter: Payer: Self-pay | Admitting: Internal Medicine

## 2020-11-28 ENCOUNTER — Other Ambulatory Visit: Payer: Self-pay

## 2020-11-28 DIAGNOSIS — I2699 Other pulmonary embolism without acute cor pulmonale: Secondary | ICD-10-CM

## 2020-11-28 DIAGNOSIS — Z9114 Patient's other noncompliance with medication regimen: Secondary | ICD-10-CM | POA: Insufficient documentation

## 2020-11-28 DIAGNOSIS — Z79899 Other long term (current) drug therapy: Secondary | ICD-10-CM | POA: Insufficient documentation

## 2020-11-28 DIAGNOSIS — Z87891 Personal history of nicotine dependence: Secondary | ICD-10-CM | POA: Insufficient documentation

## 2020-11-28 DIAGNOSIS — Z8547 Personal history of malignant neoplasm of testis: Secondary | ICD-10-CM | POA: Insufficient documentation

## 2020-11-28 DIAGNOSIS — Z7901 Long term (current) use of anticoagulants: Secondary | ICD-10-CM | POA: Insufficient documentation

## 2020-11-28 LAB — CBC WITH DIFFERENTIAL/PLATELET
Abs Immature Granulocytes: 0.03 10*3/uL (ref 0.00–0.07)
Basophils Absolute: 0.1 10*3/uL (ref 0.0–0.1)
Basophils Relative: 1 %
Eosinophils Absolute: 0.4 10*3/uL (ref 0.0–0.5)
Eosinophils Relative: 5 %
HCT: 48.6 % (ref 39.0–52.0)
Hemoglobin: 16.8 g/dL (ref 13.0–17.0)
Immature Granulocytes: 0 %
Lymphocytes Relative: 17 %
Lymphs Abs: 1.4 10*3/uL (ref 0.7–4.0)
MCH: 31.6 pg (ref 26.0–34.0)
MCHC: 34.6 g/dL (ref 30.0–36.0)
MCV: 91.5 fL (ref 80.0–100.0)
Monocytes Absolute: 0.6 10*3/uL (ref 0.1–1.0)
Monocytes Relative: 7 %
Neutro Abs: 5.4 10*3/uL (ref 1.7–7.7)
Neutrophils Relative %: 70 %
Platelets: 256 10*3/uL (ref 150–400)
RBC: 5.31 MIL/uL (ref 4.22–5.81)
RDW: 14 % (ref 11.5–15.5)
WBC: 7.9 10*3/uL (ref 4.0–10.5)
nRBC: 0 % (ref 0.0–0.2)

## 2020-11-28 LAB — COMPREHENSIVE METABOLIC PANEL
ALT: 40 U/L (ref 0–44)
AST: 37 U/L (ref 15–41)
Albumin: 4.6 g/dL (ref 3.5–5.0)
Alkaline Phosphatase: 61 U/L (ref 38–126)
Anion gap: 11 (ref 5–15)
BUN: 15 mg/dL (ref 6–20)
CO2: 24 mmol/L (ref 22–32)
Calcium: 9.6 mg/dL (ref 8.9–10.3)
Chloride: 103 mmol/L (ref 98–111)
Creatinine, Ser: 1.24 mg/dL (ref 0.61–1.24)
GFR, Estimated: >60 mL/min (ref >60)
Glucose, Bld: 100 mg/dL — ABNORMAL HIGH (ref 70–99)
Potassium: 4.3 mmol/L (ref 3.5–5.1)
Sodium: 138 mmol/L (ref 135–145)
Total Bilirubin: 0.9 mg/dL (ref 0.3–1.2)
Total Protein: 7.8 g/dL (ref 6.5–8.1)

## 2020-11-28 MED ORDER — ALBUTEROL SULFATE (2.5 MG/3ML) 0.083% IN NEBU
2.5000 mg | INHALATION_SOLUTION | RESPIRATORY_TRACT | 0 refills | Status: DC | PRN
  Filled 2020-11-28: qty 90, 5d supply, fill #0
  Filled 2020-12-08: qty 90, 5d supply, fill #1

## 2020-11-28 NOTE — Assessment & Plan Note (Addendum)
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  Recommend 21-month of anticoagulation; ordered partial hypercoagulable work-up.   #History of grand mal seizure [2021-last episode].  Noncompliant with Keppra I had a long discussion with the patient regarding the importance of compliance with his taking his antiseizure medication on a regular basis to prevent a seizure episode.  Seizure episode with especially put him at risk for head trauma which while on anticoagulation could be lethal.  I recommend follow-up/evaluation with neurology regarding alternative antiepileptic medication because of patient's med intolerance.  #Wheezing/short of breath on exertion-history of asthma poorly controlled; called in albuterol nebulizer.   #Sinus tachycardia-m question secondary to albuterol.  Defer to PCP regarding levalbuterol.   # DISPOSITION: # follow up in 3 months- MD; labs- cbc/cmp Dr.B

## 2020-11-28 NOTE — Progress Notes (Signed)
[name]  Patient Care Team: Marguerita Merles, MD as PCP - General (Family Medicine)  CHIEF COMPLAINTS/PURPOSE OF CONSULTATION: DVT/PE  # FEB 22nd, 2022- Positive for small pulmonary emboli in right lower lobe branches with associated posterior basal segment lower lobe pulmonary infarct and a small layering right pleural effusion. Other bilateral pulmonary arteries appear patent. No central or saddle embolus. Family history: mother; no other risk factors.  On Eliquis   # Hx of Seizues ? 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     HISTORY OF PRESENTING ILLNESS:  Shane Leach 35 y.o.  male with newly diagnosed right lower lobe pulmonary embolus on eliquis is here for a follow up.  Patient has history of asthma-however not well controlled complains of intermittent wheezing.  He ran out of his albuterol nebulizer medication.  Complains of cough.  Complains of intermittent palpitation especially after nebulizer treatment.   No falls.  No seizures.  However he admits to noncompliance with his Keppra because of fatigue/stomach upset.   Review of Systems  Constitutional:  Negative for chills, diaphoresis, fever, malaise/fatigue and weight loss.  HENT:  Negative for nosebleeds and sore throat.   Eyes:  Negative for double vision.  Respiratory:  Positive for shortness of breath and wheezing. Negative for cough, hemoptysis and sputum production.   Cardiovascular:  Negative for palpitations, orthopnea and leg swelling.  Gastrointestinal:  Negative for abdominal pain, blood in stool, constipation, diarrhea, heartburn, melena, nausea and vomiting.  Genitourinary:  Negative for dysuria, frequency and urgency.  Musculoskeletal:  Positive for back pain and joint pain.  Skin: Negative.  Negative for itching and rash.  Neurological:  Negative for dizziness, tingling,  focal weakness, weakness and headaches.  Endo/Heme/Allergies:  Does not bruise/bleed easily.  Psychiatric/Behavioral:  Negative for depression. The patient is not nervous/anxious and does not have insomnia.     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   Epilepsy ([place])    EtOH dependence ([place])    In remission   Pulmonary embolism ([place]) 06/19/2020   Seizures ([place])     SURGICAL HISTORY: Past Surgical History:  Procedure Laterality Date   testicular cancer     right testicle removed    SOCIAL HISTORY: Social History   Socioeconomic History   Marital status: Married    Spouse name: Not on file   Number of children: Not on file   Years of education: Not on file   Highest education level: Not on file  Occupational History   Not on file  Tobacco Use   Smoking status: Former    Packs/day: 0.50    Years: 15.00    Pack years: 7.50    Types: Cigarettes    Quit date: 07/10/2020    Years since quitting: 0.3   Smokeless tobacco: Never  Substance and Sexual Activity   Alcohol use: Yes    Alcohol/week: 2.0 standard drinks    Types: 2 Cans of beer per week    Comment: drinks 2 16 oz cans of beer weekly   Drug use: Not Currently    Types: Marijuana    Comment: used marijuana in the past   Sexual activity: Yes    Partners: Female    Birth control/protection: Condom  Comment: in a monogamous relationship  Other Topics Concern   Not on file  Social History Narrative   Lives in [place]; 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 Drinks 2 16 ounce beers/day.    Social Determinants of Health   Financial Resource Strain: Medium Risk   Difficulty of Paying Living Expenses: Somewhat hard  Food Insecurity: No Food Insecurity   Worried About Charity fundraiser in the Last Year: Never true   Ran Out of Food in the Last Year: Never true  Transportation  Needs: No Transportation Needs   Lack of Transportation (Medical): No   Lack of Transportation (Non-Medical): No  Physical Activity: Sufficiently Active   Days of Exercise per Week: 3 days   Minutes of Exercise per Session: 120 min  Stress: No Stress Concern Present   Feeling of Stress : Only a little  Social Connections: Socially Isolated   Frequency of Communication with Friends and Family: Twice a week   Frequency of Social Gatherings with Friends and Family: Once a week   Attends Religious Services: Never   Marine scientist or Organizations: No   Attends Music therapist: Never   Marital Status: Separated  Human resources officer Violence: Not on file    FAMILY HISTORY: Family History  Problem Relation Age of Onset   Stroke Mother    Deep vein thrombosis Mother    Diabetes Mother    Hypertension Mother    Cancer Father     ALLERGIES:  is allergic to contrast media [iodinated diagnostic agents], dilantin [phenytoin sodium extended], penicillins, and tegretol [carbamazepine].  MEDICATIONS:  Current Outpatient Medications  Medication Sig Dispense Refill   albuterol (VENTOLIN HFA) 108 (90 Base) MCG/ACT inhaler Inhale 2 puffs into the lungs every 4 (four) hours as needed for wheezing or shortness of breath. 6.7 g 0   apixaban (ELIQUIS) 5 MG TABS tablet Take 1 tablet (5 mg total) by mouth 2 (two) times daily. 60 tablet 2   levETIRAcetam (KEPPRA) 1000 MG tablet TAKE ONE TABLET BY MOUTH 2 TIMES A DAY 60 tablet 3   albuterol (PROVENTIL) (2.5 MG/3ML) 0.083% nebulizer solution Inhale 3 mLs (2.5 mg total) by nebulization once every 4 (four) hours as needed for wheezing or shortness of breath. 90 mL 0   benzonatate (TESSALON PERLES) 100 MG capsule Take 1 capsule (100 mg total) by mouth 2 (two) times daily as needed for cough. (Patient not taking: Reported on 11/28/2020) 20 capsule 0   Fluticasone-Salmeterol (ADVAIR DISKUS) 100-50 MCG/DOSE AEPB Inhale 1 puff into the lungs in  the morning and at bedtime. (Patient not taking: Reported on 11/28/2020) 60 each 2   HYDROcodone-acetaminophen (NORCO/VICODIN) 5-325 MG tablet Take 1-2 tablets by mouth every 6 (six) hours as needed for moderate pain or severe pain. (Patient not taking: Reported on 11/28/2020) 12 tablet 0   No current facility-administered medications for this visit.      Marland Kitchen  PHYSICAL EXAMINATION:  Vitals:   11/28/20 1028  BP: 115/82  Pulse: (!) 108  Resp: 20  Temp: 97.8 F (36.6 C)  SpO2: 100%   Filed Weights   11/28/20 1028  Weight: 253 lb 12.8 oz (115.1 kg)    Physical Exam Constitutional:      Comments: Patient is alone.  Ambulating independently  HENT:     Head: Normocephalic and atraumatic.     Mouth/Throat:     Pharynx: No oropharyngeal exudate.  Eyes:     Pupils:  Pupils are equal, round, and reactive to light.  Cardiovascular:     Rate and Rhythm: Normal rate and regular rhythm.  Pulmonary:     Effort: Pulmonary effort is normal. No respiratory distress.     Breath sounds: Normal breath sounds. No wheezing.     Comments: Bilateral scattered wheezing. Abdominal:     General: Bowel sounds are normal. There is no distension.     Palpations: Abdomen is soft. There is no mass.     Tenderness: There is no abdominal tenderness. There is no guarding or rebound.  Musculoskeletal:        General: No tenderness. Normal range of motion.     Cervical back: Normal range of motion and neck supple.  Skin:    General: Skin is warm.  Neurological:     Mental Status: He is alert and oriented to person, place, and time.  Psychiatric:        Mood and Affect: Affect normal.     LABORATORY DATA:  I have reviewed the data as listed Lab Results  Component Value Date   WBC 7.9 11/28/2020   HGB 16.8 11/28/2020   HCT 48.6 11/28/2020   MCV 91.5 11/28/2020   PLT 256 11/28/2020   Recent Labs    01/20/20 0929 03/22/20 1114 09/17/20 0747 10/26/20 1208 10/26/20 1524 11/28/20 1010  NA 140    < > 135 137  --  138  K 4.6   < > 4.4 4.4  --  4.3  CL 104   < > 107 104  --  103  CO2 24   < > 21* 21*  --  24  GLUCOSE 107*   < > 106* 96  --  100*  BUN 17   < > 12 9  --  15  CREATININE 1.40*   < > 1.21 1.10  --  1.24  CALCIUM 9.6   < > 9.1 9.5  --  9.6  GFRNONAA >60   < > >60 >60  --  >60  GFRAA >60  --   --   --   --   --   PROT  --    < > 7.4  --  7.0 7.8  ALBUMIN  --    < > 4.0  --  3.9 4.6  AST  --    < > 28  --  37 37  ALT  --    < > 30  --  38 40  ALKPHOS  --    < > 54  --  64 61  BILITOT  --    < > 1.2  --  0.8 0.9  BILIDIR  --   --   --   --  <0.1  --   IBILI  --   --   --   --  NOT CALCULATED  --    < > = values in this interval not displayed.    RADIOGRAPHIC STUDIES: I have personally reviewed the radiological images as listed and agreed with the findings in the report. No results found.   ASSESSMENT & PLAN:   Pulmonary embolism on right [facility]) #Right lower lobe pulmonary embolus/pulmonary infarct-currently on Eliquis.  Recommend 6 months or 12 months of anticoagulation.  Patient tolerating Eliquis well.  New refill given.  Discussed with [place]-patient assistance program.  # Etiology: Unprovoked-recommend hypercoagulable work-up next visit.  Will order.  # Wheezing/asthma- tachy; ? albeyrul-   # Grand mall seizures [phone]- last year]  #  DISPOSITION: # follow up in 3 months- MD; labs- cbc/cmp Dr.B    All questions were answered. The patient knows to call the clinic with any problems, questions or concerns.       Cammie Sickle, MD 11/28/2020 1:58 PM

## 2020-11-28 NOTE — Progress Notes (Signed)
Patient states when he takes his Eliquis and Levetiracetam( Keppra) he is very drowsy, nauseated, not been able to function throughout they day. Patient also states that when he wakes up in the morning he is having the hardest time to breathe and has pain in chest and in between shoulder blades. Patient also states that for the last month he has been having numbness and tingly feelings in his hands and feet.

## 2020-11-29 ENCOUNTER — Telehealth: Payer: Self-pay | Admitting: *Deleted

## 2020-11-29 ENCOUNTER — Other Ambulatory Visit: Payer: Medicaid Other

## 2020-11-29 ENCOUNTER — Other Ambulatory Visit: Payer: Self-pay

## 2020-11-29 DIAGNOSIS — Z87898 Personal history of other specified conditions: Secondary | ICD-10-CM

## 2020-11-29 DIAGNOSIS — I2699 Other pulmonary embolism without acute cor pulmonale: Secondary | ICD-10-CM

## 2020-11-29 LAB — BETA-2-GLYCOPROTEIN I ABS, IGG/M/A
Beta-2 Glyco I IgG: <9 GPI IgG units (ref 0–20)
Beta-2-Glycoprotein I IgA: <9 GPI IgA units (ref 0–25)
Beta-2-Glycoprotein I IgM: <9 GPI IgM units (ref 0–32)

## 2020-11-29 MED ORDER — APIXABAN 5 MG PO TABS: 5.0000 mg | ORAL_TABLET | Freq: Two times a day (BID) | ORAL | 2 refills | Status: DC

## 2020-11-29 NOTE — Progress Notes (Unsigned)
cbc

## 2020-11-29 NOTE — Telephone Encounter (Signed)
Pharmacy submitted RF request for Eliquis . Renewal sent to pharmacy

## 2020-11-30 LAB — COMPREHENSIVE METABOLIC PANEL
ALT: 38 IU/L (ref 0–44)
AST: 34 IU/L (ref 0–40)
Albumin/Globulin Ratio: 2 (ref 1.2–2.2)
Albumin: 4.7 g/dL (ref 4.0–5.0)
Alkaline Phosphatase: 68 IU/L (ref 44–121)
BUN/Creatinine Ratio: 10 (ref 9–20)
BUN: 12 mg/dL (ref 6–20)
Bilirubin Total: 0.4 mg/dL (ref 0.0–1.2)
CO2: 21 mmol/L (ref 20–29)
Calcium: 9.8 mg/dL (ref 8.7–10.2)
Chloride: 104 mmol/L (ref 96–106)
Creatinine, Ser: 1.17 mg/dL (ref 0.76–1.27)
Globulin, Total: 2.3 g/dL (ref 1.5–4.5)
Glucose: 101 mg/dL — ABNORMAL HIGH (ref 65–99)
Potassium: 4.5 mmol/L (ref 3.5–5.2)
Sodium: 140 mmol/L (ref 134–144)
Total Protein: 7 g/dL (ref 6.0–8.5)
eGFR: 85 mL/min/{1.73_m2} (ref >59)

## 2020-11-30 LAB — ANTIPHOSPHOLIPID SYNDROME PROF
Anticardiolipin IgG: <9 GPL U/mL (ref 0–14)
Anticardiolipin IgM: <9 MPL U/mL (ref 0–12)
DRVVT: 29.2 s (ref 0.0–47.0)
PTT Lupus Anticoagulant: 31.5 s (ref 0.0–51.9)

## 2020-11-30 LAB — CBC WITH DIFFERENTIAL/PLATELET
Basophils Absolute: 0.1 10*3/uL (ref 0.0–0.2)
Basos: 1 %
EOS (ABSOLUTE): 0.4 10*3/uL (ref 0.0–0.4)
Eos: 6 %
Hematocrit: 48.6 % (ref 37.5–51.0)
Hemoglobin: 15.9 g/dL (ref 13.0–17.7)
Immature Grans (Abs): 0 10*3/uL (ref 0.0–0.1)
Immature Granulocytes: 0 %
Lymphocytes Absolute: 1.1 10*3/uL (ref 0.7–3.1)
Lymphs: 17 %
MCH: 30.2 pg (ref 26.6–33.0)
MCHC: 32.7 g/dL (ref 31.5–35.7)
MCV: 92 fL (ref 79–97)
Monocytes Absolute: 0.5 10*3/uL (ref 0.1–0.9)
Monocytes: 9 %
Neutrophils Absolute: 4.3 10*3/uL (ref 1.4–7.0)
Neutrophils: 67 %
Platelets: 248 10*3/uL (ref 150–450)
RBC: 5.26 x10E6/uL (ref 4.14–5.80)
RDW: 13.6 % (ref 11.6–15.4)
WBC: 6.4 10*3/uL (ref 3.4–10.8)

## 2020-12-04 LAB — LEVETIRACETAM LEVEL: Levetiracetam Lvl: <1 ug/mL — ABNORMAL LOW (ref 10.0–40.0)

## 2020-12-04 LAB — FACTOR 5 LEIDEN

## 2020-12-04 LAB — PROTHROMBIN GENE MUTATION

## 2020-12-06 ENCOUNTER — Ambulatory Visit: Payer: Self-pay | Admitting: Gerontology

## 2020-12-08 ENCOUNTER — Other Ambulatory Visit: Payer: Self-pay

## 2020-12-14 ENCOUNTER — Other Ambulatory Visit: Payer: Self-pay

## 2020-12-14 ENCOUNTER — Encounter: Payer: Self-pay | Admitting: Gerontology

## 2020-12-14 ENCOUNTER — Ambulatory Visit: Payer: Medicaid Other | Admitting: Gerontology

## 2020-12-14 VITALS — BP 125/87 | HR 87 | Temp 97.7°F | Resp 16 | Ht 72.0 in | Wt 254.3 lb

## 2020-12-14 DIAGNOSIS — J45909 Unspecified asthma, uncomplicated: Secondary | ICD-10-CM

## 2020-12-14 DIAGNOSIS — Z87898 Personal history of other specified conditions: Secondary | ICD-10-CM

## 2020-12-14 DIAGNOSIS — I2699 Other pulmonary embolism without acute cor pulmonale: Secondary | ICD-10-CM

## 2020-12-14 MED ORDER — ALBUTEROL SULFATE HFA 108 (90 BASE) MCG/ACT IN AERS
2.0000 | INHALATION_SPRAY | RESPIRATORY_TRACT | 3 refills | Status: DC | PRN
  Filled 2020-12-14: qty 6.7, 17d supply, fill #0
  Filled 2021-01-12: qty 6.7, 17d supply, fill #1
  Filled 2021-03-14: qty 6.7, 17d supply, fill #2
  Filled 2021-04-03: qty 6.7, 17d supply, fill #3

## 2020-12-14 MED ORDER — FLUTICASONE-SALMETEROL 100-50 MCG/ACT IN AEPB
1.0000 | INHALATION_SPRAY | Freq: Two times a day (BID) | RESPIRATORY_TRACT | 4 refills | Status: DC
  Filled 2020-12-14: qty 60, 30d supply, fill #0

## 2020-12-14 MED ORDER — ALBUTEROL SULFATE (2.5 MG/3ML) 0.083% IN NEBU
2.5000 mg | INHALATION_SOLUTION | RESPIRATORY_TRACT | 3 refills | Status: DC | PRN
  Filled 2020-12-14: qty 90, 5d supply, fill #0
  Filled 2021-01-12: qty 270, 15d supply, fill #1

## 2020-12-14 MED ORDER — LEVETIRACETAM 1000 MG PO TABS
ORAL_TABLET | Freq: Two times a day (BID) | ORAL | 3 refills | Status: DC
  Filled 2020-12-14: qty 60, 30d supply, fill #0
  Filled 2021-01-12: qty 60, 30d supply, fill #1
  Filled 2021-03-21: qty 60, 30d supply, fill #2

## 2020-12-14 NOTE — Progress Notes (Signed)
Established Patient Office Visit  Subjective:  Patient ID: Shane Leach, male    DOB: March 06, 1986  Age: 35 y.o. MRN: [id number]  CC:  Chief Complaint  Patient presents with   Follow-up    Labs done 11/29/20   Asthma    HPI Shane Leach is a 35 y/o male who has history of Asthma, Bronchitis, Testicular cancer, Epilepsy, Pulmonary embolism, presents for routine follow up visit and lab review. His Levetiracetam level done on 11/29/20 was < 1.0 ug/ml, he admits to missing his evening dose of Levetiracetam, and he denies any seizure activities. He followed up with Hematologist Dr Rogue Bussing on 11/28/20 for pulmonary embolism. He recommends the continuation of Eliquis bid 6 or 12 months and will follow up with hypercoagulable work-up next visit. He denies hematuria, hematochezia and active bleeding. He was at the ED on 10/26/20 and was evaluated for chest pain and shortness of breath. He states that his breathing is stable and continues to use his inhaler as needed. Overall, he states that he's doing well and offers no further complaint.  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   Epilepsy ([place])    EtOH dependence ([place])    In remission   Pulmonary embolism ([place]) 06/19/2020   Seizures ([place])     Past Surgical History:  Procedure Laterality Date   testicular cancer     right testicle removed    Family History  Problem Relation Age of Onset   Stroke Mother    Deep vein thrombosis Mother    Diabetes Mother    Hypertension Mother    Cancer Father        lung   Cancer Maternal Grandmother    Stroke Maternal Grandfather     Social History   Socioeconomic History   Marital status: Married    Spouse name: Not on file   Number of children: Not on file   Years of education: Not on file   Highest education level: Not on file  Occupational History   Not on file  Tobacco Use   Smoking status: Former     Packs/day: 0.50    Years: 15.00    Pack years: 7.50    Types: Cigarettes    Quit date: 07/10/2020    Years since quitting: 0.4   Smokeless tobacco: Never  Vaping Use   Vaping Use: Never used  Substance and Sexual Activity   Alcohol use: Yes    Alcohol/week: 14.0 standard drinks    Types: 14 Cans of beer per week    Comment: drinks 1-2 16 oz cans of beer daily   Drug use: Not Currently    Types: Marijuana    Comment: used marijuana in the past, last use 06/2020   Sexual activity: Yes    Partners: Female    Birth control/protection: Condom    Comment: in a monogamous relationship  Other Topics Concern   Not on file  Social History Narrative   Lives in [place]; 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 Drinks 2 16 ounce beers/day.    Social Determinants of Health   Financial Resource Strain: Medium Risk   Difficulty of Paying Living Expenses: Somewhat hard  Food Insecurity: No Food Insecurity   Worried About Running Out of Food in the Last Year: Never true   Ran Out of Food in the  Last Year: Never true  Transportation Needs: No Transportation Needs   Lack of Transportation (Medical): No   Lack of Transportation (Non-Medical): No  Physical Activity: Sufficiently Active   Days of Exercise per Week: 3 days   Minutes of Exercise per Session: 120 min  Stress: No Stress Concern Present   Feeling of Stress : Only a little  Social Connections: Socially Isolated   Frequency of Communication with Friends and Family: Twice a week   Frequency of Social Gatherings with Friends and Family: Once a week   Attends Religious Services: Never   Marine scientist or Organizations: No   Attends Archivist Meetings: Never   Marital Status: Separated  Intimate Partner Violence: Not on file    Outpatient Medications Prior to Visit  Medication Sig Dispense Refill   apixaban (ELIQUIS) 5 MG TABS tablet Take 1 tablet (5 mg total) by mouth 2  (two) times daily. 60 tablet 2   albuterol (PROVENTIL) (2.5 MG/3ML) 0.083% nebulizer solution Inhale 3 mLs (2.5 mg total) by nebulization once every 4 (four) hours as needed for wheezing or shortness of breath. 90 mL 0   albuterol (VENTOLIN HFA) 108 (90 Base) MCG/ACT inhaler Inhale 2 puffs into the lungs every 4 (four) hours as needed for wheezing or shortness of breath. 6.7 g 0   Fluticasone-Salmeterol (ADVAIR DISKUS) 100-50 MCG/DOSE AEPB Inhale 1 puff into the lungs in the morning and at bedtime. 60 each 2   levETIRAcetam (KEPPRA) 1000 MG tablet TAKE ONE TABLET BY MOUTH 2 TIMES A DAY 60 tablet 3   benzonatate (TESSALON PERLES) 100 MG capsule Take 1 capsule (100 mg total) by mouth 2 (two) times daily as needed for cough. (Patient not taking: Reported on 11/28/2020) 20 capsule 0   HYDROcodone-acetaminophen (NORCO/VICODIN) 5-325 MG tablet Take 1-2 tablets by mouth every 6 (six) hours as needed for moderate pain or severe pain. (Patient not taking: Reported on 11/28/2020) 12 tablet 0   No facility-administered medications prior to visit.    Allergies  Allergen Reactions   Dilantin [Phenytoin Sodium Extended] Other (See Comments)   Penicillins Other (See Comments)    Has patient had a PCN reaction causing immediate rash, facial/tongue/throat swelling, SOB or lightheadedness with hypotension: Unknown Has patient had a PCN reaction causing severe rash involving mucus membranes or skin necrosis: Unknown Has patient had a PCN reaction that required hospitalization: Unknown Has patient had a PCN reaction occurring within the last 10 years: Unknown If all of the above answers are "NO", then may proceed with Cephalosporin use.   Phenytoin Swelling    Causes more seizures, insomnia, headache   Tegretol [Carbamazepine] Other (See Comments)   Iodinated Diagnostic Agents Itching    ROS Review of Systems  Constitutional: Negative.   Respiratory: Negative.    Cardiovascular: Negative.   Skin: Negative.    Neurological: Negative.   Hematological: Negative.   Psychiatric/Behavioral: Negative.       Objective:    Physical Exam HENT:     Head: Normocephalic and atraumatic.  Cardiovascular:     Rate and Rhythm: Normal rate and regular rhythm.     Pulses: Normal pulses.     Heart sounds: Normal heart sounds.  Pulmonary:     Effort: Pulmonary effort is normal.     Breath sounds: Normal breath sounds.  Neurological:     General: No focal deficit present.     Mental Status: He is alert and oriented to person, place, and time. Mental status  is at baseline.  Psychiatric:        Mood and Affect: Mood normal.        Behavior: Behavior normal.        Thought Content: Thought content normal.        Judgment: Judgment normal.    BP 125/87 (BP Location: Right Arm, Patient Position: Sitting, Cuff Size: Large)   Pulse 87   Temp 97.7 F (36.5 C)   Resp 16   Ht 6' (1.829 m)   Wt 254 lb 4.8 oz (115.3 kg)   SpO2 98%   BMI 34.49 kg/m  Wt Readings from Last 3 Encounters:  12/14/20 254 lb 4.8 oz (115.3 kg)  11/29/20 256 lb 8 oz (116.3 kg)  11/28/20 253 lb 12.8 oz (115.1 kg)   Encouraged weight loss   Health Maintenance Due  Topic Date Due   COVID-19 Vaccine (1) Never done   Pneumococcal Vaccine 52-56 Years old (1 - PCV) Never done   Hepatitis C Screening  Never done   TETANUS/TDAP  Never done    There are no preventive care reminders to display for this patient.  Lab Results  Component Value Date   TSH 1.310 09/06/2020   Lab Results  Component Value Date   WBC 6.4 11/29/2020   HGB 15.9 11/29/2020   HCT 48.6 11/29/2020   MCV 92 11/29/2020   PLT 248 11/29/2020   Lab Results  Component Value Date   NA 140 11/29/2020   K 4.5 11/29/2020   CO2 21 11/29/2020   GLUCOSE 101 (H) 11/29/2020   BUN 12 11/29/2020   CREATININE 1.17 11/29/2020   BILITOT 0.4 11/29/2020   ALKPHOS 68 11/29/2020   AST 34 11/29/2020   ALT 38 11/29/2020   PROT 7.0 11/29/2020   ALBUMIN 4.7 11/29/2020    CALCIUM 9.8 11/29/2020   ANIONGAP 11 11/28/2020   EGFR 85 11/29/2020   Lab Results  Component Value Date   CHOL 185 09/06/2020   Lab Results  Component Value Date   HDL 62 09/06/2020   Lab Results  Component Value Date   LDLCALC 112 (H) 09/06/2020   Lab Results  Component Value Date   TRIG 60 09/06/2020   Lab Results  Component Value Date   CHOLHDL 3.0 09/06/2020   Lab Results  Component Value Date   HGBA1C 5.6 09/06/2020      Assessment & Plan:    1. Pulmonary embolism on right [facility]) -He will continue on current medication, and follow up with Dr Rogue Bussing.  2. History of seizure -He was encouraged to take medication as recommended and go to the ED for any seizure activity. - levETIRAcetam (KEPPRA) 1000 MG tablet; TAKE ONE TABLET BY MOUTH 2 TIMES A DAY  Dispense: 60 tablet; Refill: 3 - Levetiracetam level; Future  3. Uncomplicated asthma, unspecified asthma severity, unspecified whether persistent - His breathing is stable, and he will continue on current medication. - albuterol (PROVENTIL) (2.5 MG/3ML) 0.083% nebulizer solution; Inhale 3 mLs (2.5 mg total) by nebulization once every 4 (four) hours as needed for wheezing or shortness of breath.  Dispense: 90 mL; Refill: 3 - albuterol (VENTOLIN HFA) 108 (90 Base) MCG/ACT inhaler; Inhale 2 puffs into the lungs every 4 (four) hours as needed for wheezing or shortness of breath.  Dispense: 6.7 g; Refill: 3 - fluticasone-salmeterol (ADVAIR) 100-50 MCG/ACT AEPB; Inhale 1 puff into the lungs 2 (two) times daily.  Dispense: 60 each; Refill: 4     Follow-up: Return in about  3 months (around 03/14/2021), or if symptoms worsen or fail to improve.    Deepti Gunawan Jerold Coombe, NP

## 2020-12-15 ENCOUNTER — Telehealth: Payer: Self-pay | Admitting: Pharmacist

## 2020-12-15 NOTE — Telephone Encounter (Signed)
12/15/2020 12:08:55 PM - Advair 100/50 new med to pt & dr  -- Elmer Picker - Friday, December 15, 2020 12:07 PM --Received pharmacy printout for new med- Advair 100/50 Inhale 1 puff into the lungs two (2) times daily # 3. Mailing patient his portion to sign & return, sending script to [facility] for provider to sign.

## 2021-01-11 ENCOUNTER — Other Ambulatory Visit: Payer: Self-pay

## 2021-01-12 ENCOUNTER — Other Ambulatory Visit: Payer: Self-pay

## 2021-01-29 ENCOUNTER — Other Ambulatory Visit: Payer: Self-pay

## 2021-02-14 ENCOUNTER — Other Ambulatory Visit: Payer: Self-pay | Admitting: *Deleted

## 2021-02-14 MED ORDER — APIXABAN 5 MG PO TABS: 5.0000 mg | ORAL_TABLET | Freq: Two times a day (BID) | ORAL | 2 refills | Status: DC

## 2021-02-20 NOTE — Patient Instructions (Signed)
Pulmonary Embolism A pulmonary embolism (PE) is a sudden blockage or decrease of blood flow in one or both lungs that happens when a clot travels into the arteries of the lung (pulmonary arteries). Most blockages come from a blood clot that forms in the vein of a leg or arm (deep vein thrombosis, DVT) and travels to the lungs. A clot is blood that has thickened into a gel or solid. PE is a dangerous and life-threatening condition that needs to be treated right away. What are the causes? This condition is usually caused by a blood clot that forms in a vein and moves to the lungs. In rare cases, it may be caused by air, fat, part of a tumor, or other tissue that moves through the veins and into the lungs. What increases the risk? The following factors may make you more likely to develop this condition: Experiencing a traumatic injury, such as breaking a hip or leg. Having: A spinal cord injury. Major surgery, especially hip or knee replacement, or surgery on parts of the nervous system or on the abdomen. A stroke. A blood-clotting disease. Long-term (chronic) lung or heart disease. Cancer, especially if you are being treated with chemotherapy. A central venous catheter. Taking medicines that contain estrogen. These include birth control pills and hormone replacement therapy. Being: Pregnant. In the period of time after your baby is delivered (postpartum). Older than age 13. Overweight. A smoker, especially if you have other risks. Not very active (sedentary), not being able to move at all, or spending long periods sitting, such as travel over 6 hours. You are also at a greater risk if you have a leg in a cast or splint. What are the signs or symptoms? Symptoms of this condition usually start suddenly and include: Shortness of breath during activity or at rest. Coughing, coughing up blood, or coughing up bloody mucus. Chest pain, back pain, or shoulder blade pain that gets worse with deep  breaths. Rapid or irregular heartbeat. Feeling light-headed or dizzy, or fainting. Feeling anxious. Pain and swelling in a leg. This is a symptom of DVT, which can lead to PE. How is this diagnosed? This condition may be diagnosed based on your medical history, a physical exam, and tests. Tests may include: Blood tests. An ECG (electrocardiogram) of the heart. A CT pulmonary angiogram. This test checks blood flow in and around your lungs. A ventilation-perfusion scan, also called a lung VQ scan. This test measures air flow and blood flow to the lungs. An ultrasound to check for a DVT. How is this treated? Treatment for this condition depends on many factors, such as the cause of your PE, your risk for bleeding or developing more clots, and other medical conditions you may have. Treatment aims to stop blood clots from forming or growing larger. In some cases, treatment may be aimed at breaking apart or removing the blood clot. Treatment may include: Medicines, such as: Blood thinning medicines, also called anticoagulants, to stop clots from forming and growing. Medicines that break apart clots (fibrinolytics). Procedures, such as: Using a flexible tube to remove a blood clot (embolectomy) or to deliver medicine to destroy it (catheter-directed thrombolysis). Surgery to remove the clot (surgical embolectomy). This is rare. You may need a combination of immediate, long-term, and extended treatments. Your treatment may continue for several months (maintenance therapy) or longer depending on your medical conditions. You and your health care provider will work together to choose the treatment program that is best for you. Follow  these instructions at home: Medicines Take over-the-counter and prescription medicines only as told by your health care provider. If you are taking blood thinners: Talk with your health care provider before you take any medicines that contain aspirin or NSAIDs, such as  ibuprofen. These medicines increase your risk for dangerous bleeding. Take your medicine exactly as told, at the same time every day. Avoid activities that could cause injury or bruising, and follow instructions about how to prevent falls. Wear a medical alert bracelet or carry a card that lists what medicines you take. Understand what foods and drugs interact with any medicines that you are taking. General instructions Ask your health care provider when you may return to your normal activities. Avoid sitting or lying for a long time without moving. Maintain a healthy weight. Ask your health care provider what weight is healthy for you. Do not use any products that contain nicotine or tobacco. These products include cigarettes, chewing tobacco, and vaping devices, such as e-cigarettes. If you need help quitting, ask your health care provider. Talk with your health care provider about any travel plans. It is important to make sure that you are still able to take your medicine while traveling. Keep all follow-up visits. This is important. Where to find more information American Lung Association: www.lung.org Centers for Disease Control and Prevention: http://www.wolf.info/ Contact a health care provider if: You missed a dose of your blood thinner medicine. You have a fever. Get help right away if: You have: New or increased pain, swelling, warmth, or redness in an arm or leg. Shortness of breath that gets worse during activity or at rest. Worsening chest pain. A rapid or irregular heartbeat. A severe headache. Vision changes. A serious fall or accident, or you hit your head. Blood in your vomit, stool, or urine. A cut that will not stop bleeding. You cough up blood. You feel light-headed or dizzy, and that feeling does not go away. You cannot move your arms or legs. You are confused or have memory loss. These symptoms may represent a serious problem that is an emergency. Do not wait to see if the  symptoms will go away. Get medical help right away. Call your local emergency services (911 in the [place] Do not drive yourself to the hospital. Summary A pulmonary embolism (PE) is a serious and potentially life-threatening condition. It happens when a blood clot from one part of the body travels to the arteries of the lung, causing a sudden blockage or decrease of blood flow to the lungs. This may result in shortness of breath, chest pain, dizziness, and fainting. Treatments for this condition usually include medicines to thin your blood (anticoagulants) or medicines to break apart blood clots. If you are given blood thinners, take your medicine exactly as told by your health care provider, at the same time every day. This is important. Understand what foods and drugs interact with any medicines that you are taking. If you have signs of PE or DVT, call your local emergency services (911 in the [place] This information is not intended to replace advice given to you by your health care provider. Make sure you discuss any questions you have with your health care provider. Document Revised: 04/28/2020 Document Reviewed: 04/28/2020 Elsevier Patient Education  2022 [employer].

## 2021-02-23 ENCOUNTER — Other Ambulatory Visit: Payer: Self-pay

## 2021-02-23 DIAGNOSIS — I2699 Other pulmonary embolism without acute cor pulmonale: Secondary | ICD-10-CM

## 2021-02-28 ENCOUNTER — Inpatient Hospital Stay: Payer: Medicaid Other

## 2021-02-28 ENCOUNTER — Inpatient Hospital Stay: Payer: Medicaid Other | Admitting: Internal Medicine

## 2021-03-07 ENCOUNTER — Other Ambulatory Visit: Payer: Medicaid Other

## 2021-03-07 ENCOUNTER — Other Ambulatory Visit: Payer: Self-pay

## 2021-03-12 ENCOUNTER — Other Ambulatory Visit: Payer: Self-pay

## 2021-03-13 ENCOUNTER — Ambulatory Visit: Payer: Medicaid Other

## 2021-03-13 ENCOUNTER — Inpatient Hospital Stay: Payer: Medicaid Other

## 2021-03-13 ENCOUNTER — Inpatient Hospital Stay: Payer: Medicaid Other | Admitting: Internal Medicine

## 2021-03-13 NOTE — Progress Notes (Signed)
  Medication Management Clinic Visit Note  Patient: Shane Leach MRN: [id number] Date of Birth: 1986-01-14 PCP: Shane Merles, MD   [name] 35 y.o. male. Reached out via telephone. No answer and unable to leave voicemail. Will reschedule MTM visit.  There were no vitals taken for this visit.  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   Epilepsy ([place])    EtOH dependence ([place])    In remission   Pulmonary embolism ([place]) 06/19/2020   Seizures ([place])       Past Surgical History:  Procedure Laterality Date   testicular cancer     right testicle removed     Family History  Problem Relation Age of Onset   Stroke Mother    Deep vein thrombosis Mother    Diabetes Mother    Hypertension Mother    Cancer Father        lung   Cancer Maternal Grandmother    Stroke Maternal Grandfather              Social History   Substance and Sexual Activity  Alcohol Use Yes   Alcohol/week: 14.0 standard drinks   Types: 14 Cans of beer per week   Comment: drinks 1-2 16 oz cans of beer daily      Social History   Tobacco Use  Smoking Status Former   Packs/day: 0.50   Years: 15.00   Pack years: 7.50   Types: Cigarettes   Quit date: 07/10/2020   Years since quitting: 0.6  Smokeless Tobacco Never      Health Maintenance  Topic Date Due   COVID-19 Vaccine (1) Never done   Hepatitis C Screening  Never done   TETANUS/TDAP  Never done   INFLUENZA VACCINE  Never done   HIV Screening  Completed   HPV VACCINES  Aged Out     Assessment and Plan:  Shane Leach, PharmD Pharmacy Resident  03/13/2021 3:07 PM

## 2021-03-14 ENCOUNTER — Other Ambulatory Visit: Payer: Self-pay

## 2021-03-14 ENCOUNTER — Other Ambulatory Visit: Payer: Self-pay | Admitting: Emergency Medicine

## 2021-03-14 ENCOUNTER — Ambulatory Visit: Payer: Medicaid Other | Admitting: Gerontology

## 2021-03-14 ENCOUNTER — Other Ambulatory Visit: Payer: Medicaid Other

## 2021-03-14 DIAGNOSIS — J45909 Unspecified asthma, uncomplicated: Secondary | ICD-10-CM

## 2021-03-14 DIAGNOSIS — Z87898 Personal history of other specified conditions: Secondary | ICD-10-CM

## 2021-03-14 DIAGNOSIS — I2699 Other pulmonary embolism without acute cor pulmonale: Secondary | ICD-10-CM

## 2021-03-14 MED ORDER — ALBUTEROL SULFATE (2.5 MG/3ML) 0.083% IN NEBU
2.5000 mg | INHALATION_SOLUTION | RESPIRATORY_TRACT | 0 refills | Status: DC | PRN
  Filled 2021-03-14: qty 90, 5d supply, fill #0

## 2021-03-15 LAB — COMPREHENSIVE METABOLIC PANEL
ALT: 37 IU/L (ref 0–44)
AST: 28 IU/L (ref 0–40)
Albumin/Globulin Ratio: 2.1 (ref 1.2–2.2)
Albumin: 5.1 g/dL — ABNORMAL HIGH (ref 4.0–5.0)
Alkaline Phosphatase: 84 IU/L (ref 44–121)
BUN/Creatinine Ratio: 6 — ABNORMAL LOW (ref 9–20)
BUN: 8 mg/dL (ref 6–20)
Bilirubin Total: 0.8 mg/dL (ref 0.0–1.2)
CO2: 22 mmol/L (ref 20–29)
Calcium: 10.1 mg/dL (ref 8.7–10.2)
Chloride: 103 mmol/L (ref 96–106)
Creatinine, Ser: 1.32 mg/dL — ABNORMAL HIGH (ref 0.76–1.27)
Globulin, Total: 2.4 g/dL (ref 1.5–4.5)
Glucose: 91 mg/dL (ref 70–99)
Potassium: 4.7 mmol/L (ref 3.5–5.2)
Sodium: 140 mmol/L (ref 134–144)
Total Protein: 7.5 g/dL (ref 6.0–8.5)
eGFR: 73 mL/min/{1.73_m2} (ref >59)

## 2021-03-15 LAB — CBC WITH DIFFERENTIAL/PLATELET
Basophils Absolute: 0.1 10*3/uL (ref 0.0–0.2)
Basos: 1 %
EOS (ABSOLUTE): 0.3 10*3/uL (ref 0.0–0.4)
Eos: 5 %
Hematocrit: 54.8 % — ABNORMAL HIGH (ref 37.5–51.0)
Hemoglobin: 18.5 g/dL — ABNORMAL HIGH (ref 13.0–17.7)
Immature Grans (Abs): 0 10*3/uL (ref 0.0–0.1)
Immature Granulocytes: 0 %
Lymphocytes Absolute: 1.6 10*3/uL (ref 0.7–3.1)
Lymphs: 22 %
MCH: 30.1 pg (ref 26.6–33.0)
MCHC: 33.8 g/dL (ref 31.5–35.7)
MCV: 89 fL (ref 79–97)
Monocytes Absolute: 0.6 10*3/uL (ref 0.1–0.9)
Monocytes: 8 %
Neutrophils Absolute: 4.7 10*3/uL (ref 1.4–7.0)
Neutrophils: 64 %
Platelets: 306 10*3/uL (ref 150–450)
RBC: 6.15 x10E6/uL — ABNORMAL HIGH (ref 4.14–5.80)
RDW: 12.6 % (ref 11.6–15.4)
WBC: 7.4 10*3/uL (ref 3.4–10.8)

## 2021-03-17 LAB — LEVETIRACETAM LEVEL: Levetiracetam Lvl: <2 ug/mL — ABNORMAL LOW (ref 10.0–40.0)

## 2021-03-20 ENCOUNTER — Other Ambulatory Visit: Payer: Self-pay

## 2021-03-20 ENCOUNTER — Ambulatory Visit: Payer: Medicaid Other

## 2021-03-20 DIAGNOSIS — Z79899 Other long term (current) drug therapy: Secondary | ICD-10-CM

## 2021-03-20 NOTE — Progress Notes (Signed)
Medication Management Clinic Visit Note  Patient: Shane Leach MRN: [id number] Date of Birth: [birth year] PCP: Marguerita Merles, MD   [name] 35 y.o. male presents for a MTM visit via telephone today. Identified by name and date of birth.  There were no vitals taken for this visit.  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   Epilepsy ([place])    EtOH dependence ([place])    In remission   Pulmonary embolism ([place]) 06/19/2020   Seizures ([place])       Past Surgical History:  Procedure Laterality Date   testicular cancer     right testicle removed     Family History  Problem Relation Age of Onset   Stroke Mother    Deep vein thrombosis Mother    Diabetes Mother    Hypertension Mother    Cancer Father        lung   Cancer Maternal Grandmother    Stroke Maternal Grandfather     New Diagnoses (since last visit): None            Social History   Substance and Sexual Activity  Alcohol Use Yes   Alcohol/week: 14.0 standard drinks   Types: 14 Cans of beer per week   Comment: drinks 1-2 16 oz cans of beer daily      Social History   Tobacco Use  Smoking Status Former   Packs/day: 0.50   Years: 15.00   Pack years: 7.50   Types: Cigarettes   Quit date: 07/10/2020   Years since quitting: 0.6  Smokeless Tobacco Never      Health Maintenance  Topic Date Due   COVID-19 Vaccine (1) Never done   Hepatitis C Screening  Never done   TETANUS/TDAP  Never done   INFLUENZA VACCINE  Never done   HIV Screening  Completed   HPV VACCINES  Aged Out   Outpatient Encounter Medications as of 03/20/2021  Medication Sig   albuterol (PROVENTIL) (2.5 MG/3ML) 0.083% nebulizer solution Inhale 3 mLs (2.5 mg total) by nebulization once every 4 (four) hours as needed for wheezing or shortness of breath.   albuterol (VENTOLIN HFA) 108 (90 Base) MCG/ACT inhaler Inhale 2 puffs into  the lungs every 4 (four) hours as needed for wheezing or shortness of breath.   apixaban (ELIQUIS) 5 MG TABS tablet Take 1 tablet (5 mg total) by mouth 2 (two) times daily.   fluticasone-salmeterol (ADVAIR) 100-50 MCG/ACT AEPB Inhale 1 puff into the lungs 2 (two) times daily.   levETIRAcetam (KEPPRA) 1000 MG tablet TAKE ONE TABLET BY MOUTH 2 TIMES A DAY   [DISCONTINUED] ipratropium-albuterol (DUONEB) 0.5-2.5 (3) MG/3ML SOLN Take 3 mLs by nebulization every 4 (four) hours as needed.   No facility-administered encounter medications on file as of 03/20/2021.      Health Maintenance/Date Completed  Last ED visit: 10/2020 Last Visit to PCP: 12/14/2020 Next Visit to PCP:  Specialist Visit: 10/28 with Dr. Rogue Bussing Dental Exam: 35 years old Eye Exam: No eye glasses Prostate Exam: No  Flu Vaccine: No  Pneumonia Vaccine: No COVID-19 Vaccine: No   Assessment and Plan:  Asthma: Managed on short acting bronchodilator nebulizer and inhaler with newly prescribed Advair. Uses nebulizer 5-6 times per day and albuterol inhaler infrequently due to increased heart rate. Has not started Advair as of yet. Advised patient to fill  out paperwork in order to complete patient assistance application. Counseled patient on Advair (rinsing mouth, and mechanism of inhaler) and emphasized importance of using Advair as scheduled to prevent shortness of breath. Hx Pulmonary Embolism (06/19/2020): Managed on Eliquis 5 mg BID by oncology. Unclear duration as of October 2022. Patient has upcoming appointment on Oct. 28th. Advised patient to ask about duration of anticoagulation. Informed patient that we will need to start patient assistance renewal process in November.  Seizures: Managed on levetiracetam 1,000 mg twice daily. Patient states adherence to medication. Medication overdue per fill history. Patient requests refill. Will coordinate transportation as below. Health maintenance: Patient requests transportation to  Medication Management as well as appointment at [facility] tomorrow. Advised patient on Link route. Will coordinate Melburn Popper for patient. Patient also inquires about Medicaid disability process. Will discuss with our eligibility specialist, Alm Bustard.    RTC: 1 year   Wynelle Cleveland, PharmD Pharmacy Resident  03/20/2021 9:30 AM

## 2021-03-21 ENCOUNTER — Ambulatory Visit: Payer: Medicaid Other | Admitting: Gerontology

## 2021-03-21 ENCOUNTER — Other Ambulatory Visit: Payer: Self-pay

## 2021-03-22 ENCOUNTER — Other Ambulatory Visit: Payer: Self-pay

## 2021-03-27 ENCOUNTER — Other Ambulatory Visit: Payer: Self-pay | Admitting: Gerontology

## 2021-03-27 ENCOUNTER — Other Ambulatory Visit: Payer: Self-pay

## 2021-03-27 DIAGNOSIS — J45909 Unspecified asthma, uncomplicated: Secondary | ICD-10-CM

## 2021-03-27 MED ORDER — ALBUTEROL SULFATE (2.5 MG/3ML) 0.083% IN NEBU
2.5000 mg | INHALATION_SOLUTION | RESPIRATORY_TRACT | 0 refills | Status: DC | PRN
  Filled 2021-03-27: qty 90, 5d supply, fill #0

## 2021-03-30 ENCOUNTER — Other Ambulatory Visit: Payer: Self-pay | Admitting: *Deleted

## 2021-03-30 DIAGNOSIS — I2699 Other pulmonary embolism without acute cor pulmonale: Secondary | ICD-10-CM

## 2021-04-03 ENCOUNTER — Other Ambulatory Visit: Payer: Self-pay | Admitting: Gerontology

## 2021-04-03 ENCOUNTER — Other Ambulatory Visit: Payer: Self-pay | Admitting: Emergency Medicine

## 2021-04-03 ENCOUNTER — Other Ambulatory Visit: Payer: Self-pay

## 2021-04-03 DIAGNOSIS — J45909 Unspecified asthma, uncomplicated: Secondary | ICD-10-CM

## 2021-04-03 MED ORDER — ALBUTEROL SULFATE (2.5 MG/3ML) 0.083% IN NEBU
2.5000 mg | INHALATION_SOLUTION | RESPIRATORY_TRACT | 0 refills | Status: DC | PRN
  Filled 2021-04-03: qty 90, 5d supply, fill #0

## 2021-04-03 MED ORDER — FLUTICASONE-SALMETEROL 100-50 MCG/ACT IN AEPB
1.0000 | INHALATION_SPRAY | Freq: Two times a day (BID) | RESPIRATORY_TRACT | 0 refills | Status: DC
  Filled 2021-04-03: qty 60, 30d supply, fill #0

## 2021-04-04 ENCOUNTER — Telehealth: Payer: Self-pay | Admitting: Pharmacist

## 2021-04-04 NOTE — Telephone Encounter (Signed)
--   Shane Leach - Wednesday, April 04, 2021 9:56 AM --Received note from Quita Skye that patient had called on his Advair, I had previously mailed forms to patient 12/15/20 to sign & return, have not received back. Also I previously mailed ProAir forms to patient 10/25/20 & 12/15/20 and have not received back. I have reprinted Advair form for patient to sign & script for Benjamine Mola to sign--Called patient and requested he come by & sign forms (since he has not returned prev. forms mailed to him). I am leaving paperwork up front in basket for patient to sign and return to me.  ProAir if going off PAP-Adam stated patient can get Albuterol from [place] to replace-we are getting from [place].  Also patient is on Eliquis-according to Quita Skye he has talked with Dr Rogue Bussing office-patient is getting Eliquis in the mail from [place] in [place], script was written 02/14/21 for 60 and 2 refills. I verified with patient-advised patient when he receives any paperwork in the mail from [facility] about renewing enrollment to bring to our office and we can then start the paperwork for him to get with [place].

## 2021-04-05 ENCOUNTER — Other Ambulatory Visit: Payer: Self-pay

## 2021-04-05 ENCOUNTER — Encounter: Payer: Self-pay | Admitting: Gerontology

## 2021-04-05 ENCOUNTER — Ambulatory Visit: Payer: Medicaid Other | Admitting: Gerontology

## 2021-04-05 VITALS — BP 148/82 | HR 94 | Temp 97.9°F | Resp 17 | Ht 72.0 in | Wt 255.1 lb

## 2021-04-05 DIAGNOSIS — Z87898 Personal history of other specified conditions: Secondary | ICD-10-CM

## 2021-04-05 DIAGNOSIS — J45909 Unspecified asthma, uncomplicated: Secondary | ICD-10-CM

## 2021-04-05 DIAGNOSIS — R059 Cough, unspecified: Secondary | ICD-10-CM

## 2021-04-05 DIAGNOSIS — R899 Unspecified abnormal finding in specimens from other organs, systems and tissues: Secondary | ICD-10-CM

## 2021-04-05 DIAGNOSIS — D751 Secondary polycythemia: Secondary | ICD-10-CM | POA: Insufficient documentation

## 2021-04-05 MED ORDER — BENZONATATE 100 MG PO CAPS
100.0000 mg | ORAL_CAPSULE | Freq: Three times a day (TID) | ORAL | 0 refills | Status: DC | PRN
  Filled 2021-04-05: qty 20, 7d supply, fill #0

## 2021-04-05 MED ORDER — LEVETIRACETAM 1000 MG PO TABS
ORAL_TABLET | Freq: Two times a day (BID) | ORAL | 3 refills | Status: AC
  Filled 2021-04-05: qty 60, fill #0

## 2021-04-05 MED ORDER — ALBUTEROL SULFATE (2.5 MG/3ML) 0.083% IN NEBU
2.5000 mg | INHALATION_SOLUTION | RESPIRATORY_TRACT | 1 refills | Status: DC | PRN
  Filled 2021-04-05 – 2021-04-09 (×2): qty 90, 5d supply, fill #0

## 2021-04-05 MED ORDER — ALBUTEROL SULFATE HFA 108 (90 BASE) MCG/ACT IN AERS
2.0000 | INHALATION_SPRAY | RESPIRATORY_TRACT | 3 refills | Status: DC | PRN
  Filled 2021-04-05 – 2021-06-13 (×2): qty 6.7, 17d supply, fill #0

## 2021-04-05 MED ORDER — FLUTICASONE-SALMETEROL 100-50 MCG/ACT IN AEPB
1.0000 | INHALATION_SPRAY | Freq: Two times a day (BID) | RESPIRATORY_TRACT | 2 refills | Status: DC
  Filled 2021-04-05: qty 60, 30d supply, fill #0

## 2021-04-05 MED ORDER — PREDNISONE 20 MG PO TABS
40.0000 mg | ORAL_TABLET | Freq: Every day | ORAL | 0 refills | Status: DC
  Filled 2021-04-05: qty 10, 5d supply, fill #0

## 2021-04-05 NOTE — Progress Notes (Signed)
Established Patient Office Visit  Subjective:  Patient ID: Shane Leach, male    DOB: May 03, 1986  Age: 35 y.o. MRN: [id number]  CC:  Chief Complaint  Patient presents with   Follow-up    Following up on labs from 03/30/21.   Shortness of Breath    Patient having increased shortness of breath and having to use his nebulizer more often. Patient has appointment with pulmonology 04/06/21.    HPI Shane Leach  is a 35 y/o male who has history of Asthma, Bronchitis, Testicular cancer, Epilepsy, Pulmonary embolism, presents for routine follow up visit and lab review. He has been using his albuterol inhaler 4 times daily, admits to experiencing intermittent shortness of breath while at rest or with exertion,wheezing, productive cough with whitish phlegm and intermittent chest tightness that has being going on for 1 week. He states that cough wakes him up at night. He denies fever and chills. He states that he is not compliant with his Levetiracetam and his level done on 03/14/2021 was <2 ug/ml. Serum creatinine was 1.32 mg/dl, Hemoglobin  was 18.5 g/dl  and Hematocrit was 54.8%.  Overall, he states that he is concerned about his shortness of breath, offers no further complaint.  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   Epilepsy ([place])    EtOH dependence ([place])    In remission   Pulmonary embolism ([place]) 06/19/2020   Seizures ([place])     Past Surgical History:  Procedure Laterality Date   testicular cancer     right testicle removed    Family History  Problem Relation Age of Onset   Stroke Mother    Deep vein thrombosis Mother    Diabetes Mother    Hypertension Mother    Cancer Father        lung   Cancer Maternal Grandmother    Stroke Maternal Grandfather     Social History   Socioeconomic History   Marital status: Married    Spouse name: Not on file   Number of children: Not on file   Years  of education: Not on file   Highest education level: Not on file  Occupational History   Not on file  Tobacco Use   Smoking status: Former    Packs/day: 0.50    Years: 15.00    Pack years: 7.50    Types: Cigarettes    Quit date: 07/10/2020    Years since quitting: 0.7   Smokeless tobacco: Never  Vaping Use   Vaping Use: Never used  Substance and Sexual Activity   Alcohol use: Yes    Alcohol/week: 14.0 standard drinks    Types: 14 Cans of beer per week    Comment: drinks 1-2 16 oz cans of beer daily   Drug use: Not Currently    Types: Marijuana    Comment: used marijuana in the past, last use 06/2020   Sexual activity: Yes    Partners: Female    Birth control/protection: Condom    Comment: in a monogamous relationship  Other Topics Concern   Not on file  Social History Narrative   Lives in [place]; 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 Drinks 2 16 ounce beers/day.    Social Determinants of Health   Financial Resource Strain: Medium Risk   Difficulty of Paying Living Expenses: Somewhat hard  Food  Insecurity: No Food Insecurity   Worried About Charity fundraiser in the Last Year: Never true   Ran Out of Food in the Last Year: Never true  Transportation Needs: No Transportation Needs   Lack of Transportation (Medical): No   Lack of Transportation (Non-Medical): No  Physical Activity: Sufficiently Active   Days of Exercise per Week: 3 days   Minutes of Exercise per Session: 120 min  Stress: No Stress Concern Present   Feeling of Stress : Only a little  Social Connections: Socially Isolated   Frequency of Communication with Friends and Family: Twice a week   Frequency of Social Gatherings with Friends and Family: Once a week   Attends Religious Services: Never   Marine scientist or Organizations: No   Attends Archivist Meetings: Never   Marital Status: Separated  Intimate Partner Violence: Not on file     Outpatient Medications Prior to Visit  Medication Sig Dispense Refill   apixaban (ELIQUIS) 5 MG TABS tablet Take 1 tablet (5 mg total) by mouth 2 (two) times daily. 60 tablet 2   albuterol (PROVENTIL) (2.5 MG/3ML) 0.083% nebulizer solution Inhale 3 mLs (2.5 mg total) by nebulization once every 4 (four) hours as needed for wheezing or shortness of breath. 90 mL 0   albuterol (VENTOLIN HFA) 108 (90 Base) MCG/ACT inhaler Inhale 2 puffs into the lungs every 4 (four) hours as needed for wheezing or shortness of breath. 6.7 g 3   fluticasone-salmeterol (ADVAIR) 100-50 MCG/ACT AEPB Inhale 1 puff into the lungs 2 (two) times daily. 60 each 0   levETIRAcetam (KEPPRA) 1000 MG tablet TAKE ONE TABLET BY MOUTH 2 TIMES A DAY 60 tablet 3   No facility-administered medications prior to visit.    Allergies  Allergen Reactions   Dilantin [Phenytoin Sodium Extended] Other (See Comments)    Makes patient feel "loopy"   Penicillins Other (See Comments)    Has patient had a PCN reaction causing immediate rash, facial/tongue/throat swelling, SOB or lightheadedness with hypotension: Unknown Has patient had a PCN reaction causing severe rash involving mucus membranes or skin necrosis: Unknown Has patient had a PCN reaction that required hospitalization: Unknown Has patient had a PCN reaction occurring within the last 10 years: Unknown If all of the above answers are "NO", then may proceed with Cephalosporin use.   Phenytoin Swelling    Causes more seizures, insomnia, headache   Tegretol [Carbamazepine] Other (See Comments)   Iodinated Diagnostic Agents Itching    ROS Review of Systems  Constitutional: Negative.   Respiratory:  Positive for cough, chest tightness, shortness of breath and wheezing.   Cardiovascular: Negative.   Neurological: Negative.   Hematological: Negative.   Psychiatric/Behavioral: Negative.       Objective:    Physical Exam HENT:     Head: Normocephalic and atraumatic.   Cardiovascular:     Rate and Rhythm: Normal rate and regular rhythm.     Pulses: Normal pulses.     Heart sounds: Normal heart sounds.  Pulmonary:     Breath sounds: Examination of the right-upper field reveals wheezing. Examination of the left-upper field reveals wheezing. Examination of the right-middle field reveals wheezing. Examination of the left-middle field reveals wheezing. Examination of the right-lower field reveals wheezing. Examination of the left-lower field reveals wheezing. Wheezing present.  Skin:    General: Skin is warm.  Neurological:     General: No focal deficit present.     Mental Status: He is  alert and oriented to person, place, and time. Mental status is at baseline.  Psychiatric:        Mood and Affect: Mood normal.        Behavior: Behavior normal.        Thought Content: Thought content normal.        Judgment: Judgment normal.    BP (!) 148/82 (BP Location: Left Arm, Patient Position: Sitting)   Pulse 94   Temp 97.9 F (36.6 C) (Oral)   Resp 17   Ht 6' (1.829 m)   Wt 255 lb 1.6 oz (115.7 kg)   SpO2 95%   BMI 34.60 kg/m  Wt Readings from Last 3 Encounters:  04/05/21 255 lb 1.6 oz (115.7 kg)  03/14/21 260 lb 9.6 oz (118.2 kg)  12/14/20 254 lb 4.8 oz (115.3 kg)   Encouraged weight loss  Health Maintenance Due  Topic Date Due   COVID-19 Vaccine (1) Never done   Pneumococcal Vaccine 75-16 Years old (1 - PCV) Never done   Hepatitis C Screening  Never done   TETANUS/TDAP  Never done   INFLUENZA VACCINE  Never done    There are no preventive care reminders to display for this patient.  Lab Results  Component Value Date   TSH 1.310 09/06/2020   Lab Results  Component Value Date   WBC 7.4 03/14/2021   HGB 18.5 (H) 03/14/2021   HCT 54.8 (H) 03/14/2021   MCV 89 03/14/2021   PLT 306 03/14/2021   Lab Results  Component Value Date   NA 140 03/14/2021   K 4.7 03/14/2021   CO2 22 03/14/2021   GLUCOSE 91 03/14/2021   BUN 8 03/14/2021    CREATININE 1.32 (H) 03/14/2021   BILITOT 0.8 03/14/2021   ALKPHOS 84 03/14/2021   AST 28 03/14/2021   ALT 37 03/14/2021   PROT 7.5 03/14/2021   ALBUMIN 5.1 (H) 03/14/2021   CALCIUM 10.1 03/14/2021   ANIONGAP 11 11/28/2020   EGFR 73 03/14/2021   Lab Results  Component Value Date   CHOL 185 09/06/2020   Lab Results  Component Value Date   HDL 62 09/06/2020   Lab Results  Component Value Date   LDLCALC 112 (H) 09/06/2020   Lab Results  Component Value Date   TRIG 60 09/06/2020   Lab Results  Component Value Date   CHOLHDL 3.0 09/06/2020   Lab Results  Component Value Date   HGBA1C 5.6 09/06/2020      Assessment & Plan:    1. Uncomplicated asthma, unspecified asthma severity, unspecified whether persistent -Possible asthma exacerbation, generalized wheezing to lung fields.  He will continue to use his inhaler, prednisone 40 mg daily for 5 days, Tessalon Perles.  He was educated on medication side effects and advised to notify clinic and go to the emergency room. - albuterol (PROVENTIL) (2.5 MG/3ML) 0.083% nebulizer solution; Inhale 3 mLs (2.5 mg total) by nebulization once every 4 (four) hours as needed for wheezing or shortness of breath.  Dispense: 90 mL; Refill: 1 - albuterol (VENTOLIN HFA) 108 (90 Base) MCG/ACT inhaler; Inhale 2 puffs into the lungs every 4 (four) hours as needed for wheezing or shortness of breath.  Dispense: 6.7 g; Refill: 3 - fluticasone-salmeterol (ADVAIR) 100-50 MCG/ACT AEPB; Inhale 1 puff into the lungs 2 (two) times daily.  Dispense: 60 each; Refill: 2 - predniSONE (DELTASONE) 20 MG tablet; Take 2 tablets (40 mg total) by mouth once daily with breakfast for 5 days.  Dispense: 10  tablet; Refill: 0  2. History of seizure -He was strongly encouraged to take medication as ordered.  He was advised to go to the emergency room for seizure activity. - levETIRAcetam (KEPPRA) 1000 MG tablet; TAKE ONE TABLET BY MOUTH 2 TIMES A DAY  Dispense: 60 tablet;  Refill: 3  3. Polycythemia -His hemoglobin and hematocrit were elevated, might be due to dehydration, was advised to increase water intake and will recheck lab.  4. Abnormal laboratory test result -His serum creatinine was elevated at 1.32, he was encouraged to increase water intake, will recheck in future.  5. Cough, unspecified type -He will continue on [employer], educated on medication side effects and advised to notify clinic and go to the emergency room. - benzonatate (TESSALON PERLES) 100 MG capsule; Take 1 capsule (100 mg total) by mouth 3 (three) times daily as needed for cough.  Dispense: 20 capsule; Refill: 0    Follow-up: Return in about 2 weeks (around 04/19/2021), or if symptoms worsen or fail to improve.    Ciji Boston Jerold Coombe, NP

## 2021-04-06 ENCOUNTER — Inpatient Hospital Stay (HOSPITAL_BASED_OUTPATIENT_CLINIC_OR_DEPARTMENT_OTHER): Payer: Self-pay | Admitting: Internal Medicine

## 2021-04-06 ENCOUNTER — Inpatient Hospital Stay: Payer: Medicaid Other | Attending: Internal Medicine

## 2021-04-06 VITALS — BP 139/90 | HR 88 | Temp 98.1°F | Resp 18 | Wt 255.6 lb

## 2021-04-06 DIAGNOSIS — R051 Acute cough: Secondary | ICD-10-CM

## 2021-04-06 DIAGNOSIS — Z87891 Personal history of nicotine dependence: Secondary | ICD-10-CM | POA: Insufficient documentation

## 2021-04-06 DIAGNOSIS — I2699 Other pulmonary embolism without acute cor pulmonale: Secondary | ICD-10-CM | POA: Insufficient documentation

## 2021-04-06 DIAGNOSIS — Z9114 Patient's other noncompliance with medication regimen: Secondary | ICD-10-CM | POA: Insufficient documentation

## 2021-04-06 DIAGNOSIS — Z8547 Personal history of malignant neoplasm of testis: Secondary | ICD-10-CM | POA: Insufficient documentation

## 2021-04-06 DIAGNOSIS — Z7901 Long term (current) use of anticoagulants: Secondary | ICD-10-CM | POA: Insufficient documentation

## 2021-04-06 LAB — CBC WITH DIFFERENTIAL/PLATELET
Abs Immature Granulocytes: 0.01 10*3/uL (ref 0.00–0.07)
Basophils Absolute: 0.1 10*3/uL (ref 0.0–0.1)
Basophils Relative: 1 %
Eosinophils Absolute: 0.4 10*3/uL (ref 0.0–0.5)
Eosinophils Relative: 5 %
HCT: 53.4 % — ABNORMAL HIGH (ref 39.0–52.0)
Hemoglobin: 17.8 g/dL — ABNORMAL HIGH (ref 13.0–17.0)
Immature Granulocytes: 0 %
Lymphocytes Relative: 20 %
Lymphs Abs: 1.4 10*3/uL (ref 0.7–4.0)
MCH: 30.8 pg (ref 26.0–34.0)
MCHC: 33.3 g/dL (ref 30.0–36.0)
MCV: 92.4 fL (ref 80.0–100.0)
Monocytes Absolute: 0.7 10*3/uL (ref 0.1–1.0)
Monocytes Relative: 10 %
Neutro Abs: 4.3 10*3/uL (ref 1.7–7.7)
Neutrophils Relative %: 64 %
Platelets: 275 10*3/uL (ref 150–400)
RBC: 5.78 MIL/uL (ref 4.22–5.81)
RDW: 13.1 % (ref 11.5–15.5)
WBC: 6.8 10*3/uL (ref 4.0–10.5)
nRBC: 0 % (ref 0.0–0.2)

## 2021-04-06 LAB — COMPREHENSIVE METABOLIC PANEL
ALT: 35 U/L (ref 0–44)
AST: 26 U/L (ref 15–41)
Albumin: 4.7 g/dL (ref 3.5–5.0)
Alkaline Phosphatase: 60 U/L (ref 38–126)
Anion gap: 8 (ref 5–15)
BUN: 9 mg/dL (ref 6–20)
CO2: 26 mmol/L (ref 22–32)
Calcium: 9.6 mg/dL (ref 8.9–10.3)
Chloride: 102 mmol/L (ref 98–111)
Creatinine, Ser: 1.15 mg/dL (ref 0.61–1.24)
GFR, Estimated: >60 mL/min (ref >60)
Glucose, Bld: 100 mg/dL — ABNORMAL HIGH (ref 70–99)
Potassium: 4.3 mmol/L (ref 3.5–5.1)
Sodium: 136 mmol/L (ref 135–145)
Total Bilirubin: 1.1 mg/dL (ref 0.3–1.2)
Total Protein: 7.8 g/dL (ref 6.5–8.1)

## 2021-04-06 NOTE — Assessment & Plan Note (Addendum)
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  Recommend anticoagulation; factor V Leiden prothrombin gene mutation /antiphospholipid antibody work-up negative.  #Discussed the importance of compliance with the patient.  Discussed with Counselling psychologist.  #History of grand mal seizure [2021-last episode].  Noncompliant with Keppra I had a long discussion with the patient regarding the importance of compliance with his taking his antiseizure medication on a regular basis to prevent a seizure episode.  Seizure episode with especially put him at risk for head trauma which while on anticoagulation could be lethal.  I recommend follow-up/evaluation with neurology regarding alternative antiepileptic medication because of patient's med intolerance.  #Wheezing/short of breath on exertion-history of asthma poorly controlled; refilled albuterol nebulizer-continue; get CXR  # DISPOSITION: # follow up in 3 months- MD; labs- cbc/cmp Dr.B

## 2021-04-06 NOTE — Progress Notes (Signed)
[place] NOTE  Patient Care Team: Marguerita Merles, MD as PCP - General (Family Medicine)  CHIEF COMPLAINTS/PURPOSE OF CONSULTATION: DVT/PE  # FEB 22nd, 2022- Positive for small pulmonary emboli in right lower lobe branches with associated posterior basal segment lower lobe pulmonary infarct and a small layering right pleural effusion. Other bilateral pulmonary arteries appear patent. No central or saddle embolus. Family history: mother; no other risk factors.  On Eliquis; May 2022 factor V Leiden prothrombin/antiphospholipid antibody negative   # Hx of Seizues ? 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     HISTORY OF PRESENTING ILLNESS:  Shane Leach 35 y.o.  male with newly diagnosed right lower lobe pulmonary embolus on eliquis is here for a follow up.  Patient states that he is more short of breath and coughing.  Also currently wheezing.  Patient admits to intermittent noncompliance with Eliquis.  He states that he has been taking Eliquis maybe once a day.  Denies having any recent seizures.   Review of Systems  Constitutional:  Negative for chills, diaphoresis, fever, malaise/fatigue and weight loss.  HENT:  Negative for nosebleeds and sore throat.   Eyes:  Negative for double vision.  Respiratory:  Positive for shortness of breath and wheezing. Negative for cough, hemoptysis and sputum production.   Cardiovascular:  Negative for palpitations, orthopnea and leg swelling.  Gastrointestinal:  Negative for abdominal pain, blood in stool, constipation, diarrhea, heartburn, melena, nausea and vomiting.  Genitourinary:  Negative for dysuria, frequency and urgency.  Musculoskeletal:  Positive for back pain and joint pain.  Skin: Negative.  Negative for itching and rash.  Neurological:  Negative for dizziness, tingling, focal weakness, weakness and headaches.   Endo/Heme/Allergies:  Does not bruise/bleed easily.  Psychiatric/Behavioral:  Negative for depression. The patient is not nervous/anxious and does not have insomnia.     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   Epilepsy ([place])    EtOH dependence ([place])    In remission   Pulmonary embolism ([place]) 06/19/2020   Seizures ([place])     SURGICAL HISTORY: Past Surgical History:  Procedure Laterality Date   testicular cancer     right testicle removed    SOCIAL HISTORY: Social History   Socioeconomic History   Marital status: Married    Spouse name: Not on file   Number of children: Not on file   Years of education: Not on file   Highest education level: Not on file  Occupational History   Not on file  Tobacco Use   Smoking status: Former    Packs/day: 0.50    Years: 15.00    Pack years: 7.50    Types: Cigarettes    Quit date: 07/10/2020    Years since quitting: 0.7   Smokeless tobacco: Never  Vaping Use   Vaping Use: Never used  Substance and Sexual Activity   Alcohol use: Yes    Alcohol/week: 14.0 standard drinks    Types: 14 Cans of beer per week    Comment: drinks 1-2 16 oz cans of beer daily   Drug use: Not Currently    Types: Marijuana    Comment: used marijuana in the past, last use 06/2020   Sexual activity: Yes    Partners:  Female    Birth control/protection: Condom    Comment: in a monogamous relationship  Other Topics Concern   Not on file  Social History Narrative   Lives in [place]; 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 Drinks 2 16 ounce beers/day.    Social Determinants of Health   Financial Resource Strain: Medium Risk   Difficulty of Paying Living Expenses: Somewhat hard  Food Insecurity: No Food Insecurity   Worried About Charity fundraiser in the Last Year: Never true   Ran Out of Food in the Last Year: Never true   Transportation Needs: No Transportation Needs   Lack of Transportation (Medical): No   Lack of Transportation (Non-Medical): No  Physical Activity: Sufficiently Active   Days of Exercise per Week: 3 days   Minutes of Exercise per Session: 120 min  Stress: No Stress Concern Present   Feeling of Stress : Only a little  Social Connections: Socially Isolated   Frequency of Communication with Friends and Family: Twice a week   Frequency of Social Gatherings with Friends and Family: Once a week   Attends Religious Services: Never   Marine scientist or Organizations: No   Attends Music therapist: Never   Marital Status: Separated  Human resources officer Violence: Not on file    FAMILY HISTORY: Family History  Problem Relation Age of Onset   Stroke Mother    Deep vein thrombosis Mother    Diabetes Mother    Hypertension Mother    Cancer Father        lung   Cancer Maternal Grandmother    Stroke Maternal Grandfather     ALLERGIES:  is allergic to dilantin [phenytoin sodium extended], penicillins, phenytoin, tegretol [carbamazepine], and iodinated diagnostic agents.  MEDICATIONS:  Current Outpatient Medications  Medication Sig Dispense Refill   albuterol (PROVENTIL) (2.5 MG/3ML) 0.083% nebulizer solution Inhale 3 mLs (2.5 mg total) by nebulization once every 4 (four) hours as needed for wheezing or shortness of breath. 90 mL 1   albuterol (VENTOLIN HFA) 108 (90 Base) MCG/ACT inhaler Inhale 2 puffs into the lungs every 4 (four) hours as needed for wheezing or shortness of breath. 6.7 g 3   apixaban (ELIQUIS) 5 MG TABS tablet Take 1 tablet (5 mg total) by mouth 2 (two) times daily. 60 tablet 2   levETIRAcetam (KEPPRA) 1000 MG tablet TAKE ONE TABLET BY MOUTH 2 TIMES A DAY 60 tablet 3   benzonatate (TESSALON PERLES) 100 MG capsule Take 1 capsule (100 mg total) by mouth 3 (three) times daily as needed for cough. (Patient not taking: Reported on 04/06/2021) 20 capsule 0    fluticasone-salmeterol (ADVAIR) 100-50 MCG/ACT AEPB Inhale 1 puff into the lungs 2 (two) times daily. (Patient not taking: Reported on 04/06/2021) 60 each 2   predniSONE (DELTASONE) 20 MG tablet Take 2 tablets (40 mg total) by mouth once daily with breakfast for 5 days. (Patient not taking: Reported on 04/06/2021) 10 tablet 0   No current facility-administered medications for this visit.      Marland Kitchen  PHYSICAL EXAMINATION:  Vitals:   04/06/21 1412  BP: 139/90  Pulse: 88  Resp: 18  Temp: 98.1 F (36.7 C)  SpO2: 97%   Filed Weights   04/06/21 1412  Weight: 255 lb 9.6 oz (115.9 kg)    Physical Exam Constitutional:      Comments: Patient is alone.  Ambulating independently  HENT:     Head: Normocephalic  and atraumatic.     Mouth/Throat:     Pharynx: No oropharyngeal exudate.  Eyes:     Pupils: Pupils are equal, round, and reactive to light.  Cardiovascular:     Rate and Rhythm: Normal rate and regular rhythm.  Pulmonary:     Effort: Pulmonary effort is normal. No respiratory distress.     Breath sounds: Normal breath sounds. No wheezing.     Comments: Bilateral scattered wheezing. Abdominal:     General: Bowel sounds are normal. There is no distension.     Palpations: Abdomen is soft. There is no mass.     Tenderness: There is no abdominal tenderness. There is no guarding or rebound.  Musculoskeletal:        General: No tenderness. Normal range of motion.     Cervical back: Normal range of motion and neck supple.  Skin:    General: Skin is warm.  Neurological:     Mental Status: He is alert and oriented to person, place, and time.  Psychiatric:        Mood and Affect: Affect normal.     LABORATORY DATA:  I have reviewed the data as listed Lab Results  Component Value Date   WBC 6.8 04/06/2021   HGB 17.8 (H) 04/06/2021   HCT 53.4 (H) 04/06/2021   MCV 92.4 04/06/2021   PLT 275 04/06/2021   Recent Labs    10/26/20 1208 10/26/20 1524 11/28/20 1010  11/29/20 1023 03/14/21 1057 04/06/21 1358  NA 137  --  138 140 140 136  K 4.4  --  4.3 4.5 4.7 4.3  CL 104  --  103 104 103 102  CO2 21*  --  24 21 22 26   GLUCOSE 96  --  100* 101* 91 100*  BUN 9  --  15 12 8 9   CREATININE 1.10  --  1.24 1.17 1.32* 1.15  CALCIUM 9.5  --  9.6 9.8 10.1 9.6  GFRNONAA >60  --  >60  --   --  >60  PROT  --  7.0 7.8 7.0 7.5 7.8  ALBUMIN  --  3.9 4.6 4.7 5.1* 4.7  AST  --  37 37 34 28 26  ALT  --  38 40 38 37 35  ALKPHOS  --  64 61 68 84 60  BILITOT  --  0.8 0.9 0.4 0.8 1.1  BILIDIR  --  <0.1  --   --   --   --   IBILI  --  NOT CALCULATED  --   --   --   --     RADIOGRAPHIC STUDIES: I have personally reviewed the radiological images as listed and agreed with the findings in the report. No results found.   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  Recommend anticoagulation; factor V Leiden prothrombin gene mutation /antiphospholipid antibody work-up negative.  #Discussed the importance of compliance with the patient.  Discussed with Counselling psychologist.  #History of grand mal seizure [2021-last episode].  Noncompliant with Keppra I had a long discussion with the patient regarding the importance of compliance with his taking his antiseizure medication on a regular basis to prevent a seizure episode.  Seizure episode with especially put him at risk for head trauma which while on anticoagulation could be lethal.  I recommend follow-up/evaluation with neurology regarding alternative antiepileptic medication because of patient's med intolerance.  #Wheezing/short of breath  on exertion-history of asthma poorly controlled; refilled albuterol nebulizer-continue; get CXR  # DISPOSITION: # follow up in 3 months- MD; labs- cbc/cmp Dr.B    All questions were answered. The patient knows to call the clinic with any problems, questions or concerns.        Cammie Sickle, MD 04/09/2021 8:23 PM

## 2021-04-06 NOTE — Progress Notes (Signed)
Patient reports that the shortness of breath and lung pain is worsening.  He has been using his nebulizer treatments more frequently with no improvement of shortness of breath.

## 2021-04-09 ENCOUNTER — Other Ambulatory Visit: Payer: Self-pay

## 2021-04-10 ENCOUNTER — Telehealth: Payer: Self-pay | Admitting: Pharmacy Technician

## 2021-04-10 ENCOUNTER — Other Ambulatory Visit: Payer: Self-pay | Admitting: *Deleted

## 2021-04-10 DIAGNOSIS — I2699 Other pulmonary embolism without acute cor pulmonale: Secondary | ICD-10-CM

## 2021-04-10 NOTE — Telephone Encounter (Signed)
Oral Oncology Patient Advocate Encounter  Met patient after appt to complete application for BMSPAF in an effort to reduce patient's out of pocket expense for Eliquis to $0.    Application completed and faxed to [phone].   [place] Patient [employer]  phone number for follow up is [phone].   This encounter will be updated until final determination.   [place] Patient [facility] Phone [phone] Fax [phone] 04/10/2021 10:58 AM

## 2021-04-12 ENCOUNTER — Telehealth: Payer: Self-pay | Admitting: Pharmacist

## 2021-04-12 NOTE — Telephone Encounter (Signed)
--   Ajay Strubel - Thursday, April 12, 2021 9:17 AM --Faxed [employer] application with script for Advair 100/50 Inhale 1 puff into the lungs 2 times a day.

## 2021-04-13 ENCOUNTER — Other Ambulatory Visit (HOSPITAL_COMMUNITY): Payer: Self-pay

## 2021-04-17 ENCOUNTER — Telehealth: Payer: Self-pay | Admitting: Pharmacist

## 2021-04-17 NOTE — Telephone Encounter (Signed)
--   Shane Leach - Tuesday, April 17, 2021 3:45 PM --Working follow up report-I have mailed paperwork for ProAir to patient Oct 24, 2020 & December 15, 2020--patient has not returned to [place].

## 2021-04-19 ENCOUNTER — Ambulatory Visit: Payer: Medicaid Other | Admitting: Gerontology

## 2021-04-19 ENCOUNTER — Encounter: Payer: Self-pay | Admitting: Gerontology

## 2021-04-19 ENCOUNTER — Other Ambulatory Visit: Payer: Self-pay

## 2021-04-19 VITALS — BP 135/93 | HR 77 | Temp 98.2°F | Resp 18 | Ht 72.0 in | Wt 262.4 lb

## 2021-04-19 DIAGNOSIS — J45909 Unspecified asthma, uncomplicated: Secondary | ICD-10-CM

## 2021-04-19 DIAGNOSIS — Z87898 Personal history of other specified conditions: Secondary | ICD-10-CM

## 2021-04-19 DIAGNOSIS — R030 Elevated blood-pressure reading, without diagnosis of hypertension: Secondary | ICD-10-CM | POA: Insufficient documentation

## 2021-04-19 NOTE — Progress Notes (Signed)
Established Patient Office Visit  Subjective:  Patient ID: Shane Leach, male    DOB: 02-Jan-1986  Age: 35 y.o. MRN: [id number]  CC:  Chief Complaint  Patient presents with   Follow-up   Asthma    HPI Shane Leach is a 35 y/o male who has history of Asthma, Bronchitis, Testicular cancer, Epilepsy, Pulmonary embolism presents for follow up visit. During his last visit on 04/05/21, he had cough, chest tightness, shortness of breath and wheezing, was treated with 40 mg Prednisone for 5 days. He presents today, stating that he completed the course of prednisone , denies shortness of breath, chest tightness, cough and wheezing. He is non compliant with his Keppra and his level done on 03/14/21 was  <2 ug/ml. He was seen by Hematologist Dr West Bali on 04/06/21 for follow up of lower lobe pulmonary embolus on eliquis. He will continue on Eliquis, follow up in 3 months. Overall, he states that he's doing well and offers no further complaint.   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   Epilepsy ([place])    EtOH dependence ([place])    In remission   Pulmonary embolism ([place]) 06/19/2020   Seizures ([place])     Past Surgical History:  Procedure Laterality Date   testicular cancer     right testicle removed    Family History  Problem Relation Age of Onset   Stroke Mother    Deep vein thrombosis Mother    Diabetes Mother    Hypertension Mother    Cancer Father        lung   Cancer Maternal Grandmother    Stroke Maternal Grandfather     Social History   Socioeconomic History   Marital status: Married    Spouse name: Not on file   Number of children: Not on file   Years of education: Not on file   Highest education level: Not on file  Occupational History   Not on file  Tobacco Use   Smoking status: Former    Packs/day: 0.50    Years: 15.00    Pack years: 7.50    Types: Cigarettes    Quit  date: 07/10/2020    Years since quitting: 0.7   Smokeless tobacco: Never  Vaping Use   Vaping Use: Never used  Substance and Sexual Activity   Alcohol use: Yes    Alcohol/week: 14.0 standard drinks    Types: 14 Cans of beer per week    Comment: 04/19/21 drinks 2-3 beers on Friday and Saturday. drinks 1-2 16 oz cans of beer daily   Drug use: Not Currently    Types: Marijuana    Comment: used marijuana in the past, last use 06/2020   Sexual activity: Yes    Partners: Female    Birth control/protection: Condom    Comment: in a monogamous relationship  Other Topics Concern   Not on file  Social History Narrative   Lives in [place]; 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 Drinks 2 16 ounce beers/day.    Social Determinants of Health   Financial Resource Strain: Medium Risk   Difficulty of Paying Living Expenses: Somewhat hard  Food Insecurity: No Food Insecurity   Worried About Running Out of Food in the Last Year: Never true   Ran Out of Food in the Last Year: Never true  Transportation Needs: No Data processing manager (Medical): No   Lack of Transportation (Non-Medical): No  Physical Activity: Sufficiently Active   Days of Exercise per Week: 3 days   Minutes of Exercise per Session: 120 min  Stress: No Stress Concern Present   Feeling of Stress : Only a little  Social Connections: Socially Isolated   Frequency of Communication with Friends and Family: Twice a week   Frequency of Social Gatherings with Friends and Family: Once a week   Attends Religious Services: Never   Marine scientist or Organizations: No   Attends Music therapist: Never   Marital Status: Separated  Intimate Partner Violence: Not on file    Outpatient Medications Prior to Visit  Medication Sig Dispense Refill   albuterol (PROVENTIL) (2.5 MG/3ML) 0.083% nebulizer solution Inhale 3 mLs (2.5 mg total) by nebulization once  every 4 (four) hours as needed for wheezing or shortness of breath. 90 mL 1   albuterol (VENTOLIN HFA) 108 (90 Base) MCG/ACT inhaler Inhale 2 puffs into the lungs every 4 (four) hours as needed for wheezing or shortness of breath. 6.7 g 3   apixaban (ELIQUIS) 5 MG TABS tablet Take 1 tablet (5 mg total) by mouth 2 (two) times daily. 60 tablet 2   fluticasone-salmeterol (ADVAIR) 100-50 MCG/ACT AEPB Inhale 1 puff into the lungs 2 (two) times daily. 60 each 2   levETIRAcetam (KEPPRA) 1000 MG tablet TAKE ONE TABLET BY MOUTH 2 TIMES A DAY 60 tablet 3   benzonatate (TESSALON PERLES) 100 MG capsule Take 1 capsule (100 mg total) by mouth 3 (three) times daily as needed for cough. (Patient not taking: Reported on 04/06/2021) 20 capsule 0   predniSONE (DELTASONE) 20 MG tablet Take 2 tablets (40 mg total) by mouth once daily with breakfast for 5 days. (Patient not taking: Reported on 04/06/2021) 10 tablet 0   No facility-administered medications prior to visit.    Allergies  Allergen Reactions   Dilantin [Phenytoin Sodium Extended] Other (See Comments)    Makes patient feel "loopy"   Penicillins Other (See Comments)    Has patient had a PCN reaction causing immediate rash, facial/tongue/throat swelling, SOB or lightheadedness with hypotension: Unknown Has patient had a PCN reaction causing severe rash involving mucus membranes or skin necrosis: Unknown Has patient had a PCN reaction that required hospitalization: Unknown Has patient had a PCN reaction occurring within the last 10 years: Unknown If all of the above answers are "NO", then may proceed with Cephalosporin use.   Phenytoin Swelling    Causes more seizures, insomnia, headache   Tegretol [Carbamazepine] Other (See Comments)   Iodinated Diagnostic Agents Itching    ROS Review of Systems  Constitutional: Negative.   Respiratory:  Negative for cough, chest tightness, shortness of breath and wheezing.   Cardiovascular: Negative.   Skin:  Negative.   Neurological: Negative.   Hematological: Negative.   Psychiatric/Behavioral: Negative.       Objective:    Physical Exam Cardiovascular:     Rate and Rhythm: Normal rate and regular rhythm.     Pulses: Normal pulses.     Heart sounds: Normal heart sounds.  Pulmonary:     Effort: Pulmonary effort is normal.     Breath sounds: Normal breath sounds.  Skin:    General: Skin is warm.  Neurological:     General: No focal deficit present.     Mental Status: He is oriented to person, place, and  time. Mental status is at baseline.  Psychiatric:        Mood and Affect: Mood normal.        Behavior: Behavior normal.        Thought Content: Thought content normal.        Judgment: Judgment normal.    BP (!) 135/93 (BP Location: Right Arm, Patient Position: Sitting, Cuff Size: Large)   Pulse 77   Temp 98.2 F (36.8 C) (Oral)   Resp 18   Ht 6' (1.829 m)   Wt 262 lb 6.4 oz (119 kg)   SpO2 97%   BMI 35.59 kg/m  Wt Readings from Last 3 Encounters:  04/19/21 262 lb 6.4 oz (119 kg)  04/06/21 255 lb 9.6 oz (115.9 kg)  04/05/21 255 lb 1.6 oz (115.7 kg)   Encouraged weight loss   Health Maintenance Due  Topic Date Due   COVID-19 Vaccine (1) Never done   Pneumococcal Vaccine 80-81 Years old (1 - PCV) Never done   Hepatitis C Screening  Never done   TETANUS/TDAP  Never done   INFLUENZA VACCINE  Never done    There are no preventive care reminders to display for this patient.  Lab Results  Component Value Date   TSH 1.310 09/06/2020   Lab Results  Component Value Date   WBC 6.8 04/06/2021   HGB 17.8 (H) 04/06/2021   HCT 53.4 (H) 04/06/2021   MCV 92.4 04/06/2021   PLT 275 04/06/2021   Lab Results  Component Value Date   NA 136 04/06/2021   K 4.3 04/06/2021   CO2 26 04/06/2021   GLUCOSE 100 (H) 04/06/2021   BUN 9 04/06/2021   CREATININE 1.15 04/06/2021   BILITOT 1.1 04/06/2021   ALKPHOS 60 04/06/2021   AST 26 04/06/2021   ALT 35 04/06/2021   PROT 7.8  04/06/2021   ALBUMIN 4.7 04/06/2021   CALCIUM 9.6 04/06/2021   ANIONGAP 8 04/06/2021   EGFR 73 03/14/2021   Lab Results  Component Value Date   CHOL 185 09/06/2020   Lab Results  Component Value Date   HDL 62 09/06/2020   Lab Results  Component Value Date   LDLCALC 112 (H) 09/06/2020   Lab Results  Component Value Date   TRIG 60 09/06/2020   Lab Results  Component Value Date   CHOLHDL 3.0 09/06/2020   Lab Results  Component Value Date   HGBA1C 5.6 09/06/2020      Assessment & Plan:   1. History of seizure -He was encouraged to adhere to his seizure medication, will follow up - Ambulatory referral to Neurology  2. Mild asthma without complication, unspecified whether persistent - His breathing is stable, he will follow up - Ambulatory referral to Pulmonology  3. Elevated blood pressure reading - His blood pressure was elevated during visit, he was advised  to monitor blood pressure, record and bring log to follow up appointment. He was encouraged to continue on DASH diet and exercise as tolerated.     Follow-up: Return in about 2 months (around 06/19/2021), or if symptoms worsen or fail to improve.    Mackenize Delgadillo Jerold Coombe, NP

## 2021-04-19 NOTE — Patient Instructions (Signed)

## 2021-04-27 ENCOUNTER — Other Ambulatory Visit: Payer: Self-pay

## 2021-04-27 ENCOUNTER — Ambulatory Visit
Admission: RE | Admit: 2021-04-27 | Discharge: 2021-04-27 | Disposition: A | Discharge status: Home / Self Care | Payer: Medicaid Other | Source: Ambulatory Visit | Attending: Internal Medicine | Admitting: Internal Medicine

## 2021-04-27 ENCOUNTER — Encounter: Payer: Self-pay | Admitting: Internal Medicine

## 2021-04-27 ENCOUNTER — Ambulatory Visit (INDEPENDENT_AMBULATORY_CARE_PROVIDER_SITE_OTHER): Payer: Self-pay | Admitting: Internal Medicine

## 2021-04-27 ENCOUNTER — Other Ambulatory Visit
Admission: RE | Admit: 2021-04-27 | Discharge: 2021-04-27 | Disposition: A | Discharge status: Home / Self Care | Payer: Medicaid Other | Source: Ambulatory Visit | Attending: Internal Medicine | Admitting: Internal Medicine

## 2021-04-27 DIAGNOSIS — J45909 Unspecified asthma, uncomplicated: Secondary | ICD-10-CM

## 2021-04-27 LAB — CBC WITH DIFFERENTIAL/PLATELET
Abs Immature Granulocytes: 0.04 10*3/uL (ref 0.00–0.07)
Basophils Absolute: 0 10*3/uL (ref 0.0–0.1)
Basophils Relative: 1 %
Eosinophils Absolute: 0.2 10*3/uL (ref 0.0–0.5)
Eosinophils Relative: 3 %
HCT: 53.3 % — ABNORMAL HIGH (ref 39.0–52.0)
Hemoglobin: 17.6 g/dL — ABNORMAL HIGH (ref 13.0–17.0)
Immature Granulocytes: 1 %
Lymphocytes Relative: 22 %
Lymphs Abs: 1.6 10*3/uL (ref 0.7–4.0)
MCH: 30.8 pg (ref 26.0–34.0)
MCHC: 33 g/dL (ref 30.0–36.0)
MCV: 93.2 fL (ref 80.0–100.0)
Monocytes Absolute: 0.6 10*3/uL (ref 0.1–1.0)
Monocytes Relative: 8 %
Neutro Abs: 5.1 10*3/uL (ref 1.7–7.7)
Neutrophils Relative %: 65 %
Platelets: 251 10*3/uL (ref 150–400)
RBC: 5.72 MIL/uL (ref 4.22–5.81)
RDW: 13.3 % (ref 11.5–15.5)
WBC: 7.6 10*3/uL (ref 4.0–10.5)
nRBC: 0 % (ref 0.0–0.2)

## 2021-04-27 MED ORDER — PREDNISONE 10 MG PO TABS
ORAL_TABLET | ORAL | 0 refills | Status: DC
  Filled 2021-04-27: qty 14, 6d supply, fill #0

## 2021-04-27 MED ORDER — ALBUTEROL SULFATE (2.5 MG/3ML) 0.083% IN NEBU
2.5000 mg | INHALATION_SOLUTION | RESPIRATORY_TRACT | 1 refills | Status: DC | PRN
  Filled 2021-04-27: qty 90, 5d supply, fill #0
  Filled 2021-06-13: qty 90, 5d supply, fill #1

## 2021-04-27 MED ORDER — MOMETASONE FURO-FORMOTEROL FUM 100-5 MCG/ACT IN AERO
INHALATION_SPRAY | RESPIRATORY_TRACT | 11 refills | Status: DC
Start: 2021-04-27 — End: 2021-04-28
  Filled 2021-04-27: qty 1, fill #0

## 2021-04-27 MED ORDER — BREZTRI AEROSPHERE 160-9-4.8 MCG/ACT IN AERO: 2.0000 | INHALATION_SPRAY | Freq: Two times a day (BID) | RESPIRATORY_TRACT | 0 refills | Status: DC

## 2021-04-27 NOTE — Patient Instructions (Addendum)
Plan A = Automatic = Always=  Symbicort 160 Take 2 puffs first thing in am and then another 2 puffs about 12 hours later.   Prednisone 10 mg take  4 each am x 2 days,   2 each am x 2 days,  1 each am x 2 days and stop    Plan B = Backup (to supplement plan A, not to replace it) Only use your albuterol inhaler as a rescue medication to be used if you can't catch your breath by resting or doing a relaxed purse lip breathing pattern.  - The less you use it, the better it will work when you need it. - Ok to use the inhaler up to 2 puffs  every 4 hours if you must but call for appointment if use goes up over your usual need - Don't leave home without it !!  (think of it like the spare tire for your car)   Plan C = Crisis (instead of Plan B but only if Plan B stops working) - only use your albuterol nebulizer if you first try Plan B and it fails to help > ok to use the nebulizer up to every 4 hours but if start needing it regularly call for immediate appointment   Please remember to go to the lab and x-ray department  for your tests - we will call you with the results when they are available.       Please schedule a follow up office visit in 6 weeks, call sooner if needed - bring inhalers with you

## 2021-04-27 NOTE — Progress Notes (Signed)
Shane Leach, male    DOB: 07/15/1985     MRN: [id number]   Brief patient profile:  48  yobm  quit smoking 07/10/20 and grew up in house of smokers with onset of asthma around 62 y old and daily inhalers since then  referred to pulmonary clinic in [facility]  04/27/2021 by Dr Renee Rival for asthma.            History of Present Illness  04/27/2021  Pulmonary/ 1st office eval/ [facility] / [facility]  Chief Complaint  Patient presents with   Pulmonary Consult    Referred by Dr. Renee Rival. Pt states has hx of asthma and bronchitis "always had trouble with breathing"- worse since beginning of 2022 when dx with PE- currently on Eliquis.  He c/o SOB with exertion and occ wakes in the night feeling SOB. He also c/o cough and wheezing- using albuterol inhaler several times per day and recently ran out.   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  No obvious day to day or daytime variability or assoc excess/ purulent sputum or mucus plugs or hemoptysis or cp or chest tightness   or overt sinus symptoms.   Also denies any obvious fluctuation of symptoms with weather or environmental changes or other aggravating or alleviating factors except as outlined above   No unusual exposure hx or h/o childhood pna/   or knowledge of premature birth.  Current Allergies, Complete Past Medical History, Past Surgical History, Family History, and Social History were reviewed in [employer] record.  ROS  The following are not active complaints unless bolded Hoarseness, sore throat, dysphagia, dental problems, itching, sneezing,  nasal congestion or discharge of excess mucus or  purulent secretions, ear ache,   fever, chills, sweats, unintended wt loss or wt gain, classically pleuritic or exertional cp,  orthopnea pnd or arm/hand swelling  or leg swelling, presyncope, palpitations, abdominal pain, anorexia, nausea, vomiting, diarrhea  or change in bowel habits or change in bladder habits, change in stools or change in urine, dysuria, hematuria,  rash, arthralgias, visual complaints, headache, numbness, weakness or ataxia or problems with walking or coordination,  change in mood or  memory.           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     albuterol (VENTOLIN HFA) 108 (90 Base) MCG/ACT inhaler Inhale 2 puffs into the lungs every 4 (four) hours as needed for wheezing or shortness of breath. 6.7 g 3   apixaban (ELIQUIS) 5 MG TABS tablet Take 1 tablet (5 mg total) by mouth 2 (two) times daily. 60 tablet 2   levETIRAcetam (KEPPRA) 1000 MG tablet TAKE ONE TABLET BY MOUTH 2  TIMES A DAY 60 tablet 3   albuterol (PROVENTIL) (2.5 MG/3ML) 0.083% nebulizer solution Inhale 3 mLs (2.5 mg total) by nebulization once every 4 (four) hours as needed for wheezing or shortness of breath. (Patient not taking: Reported on 04/27/2021) 90 mL 1   fluticasone-salmeterol (ADVAIR) 100-50 MCG/ACT AEPB Inhale 1 puff into the lungs 2 (two) times daily. (Patient not taking: Reported on 04/27/2021) 60 each 2   No facility-administered medications prior to visit.     Objective:     BP 126/74 (BP Location: Left Arm, Cuff Size: Large)   Pulse 64   Temp (!) 97 F (36.1 C) (Oral)   Ht 6' (1.829 m)   Wt 263 lb 6.4 oz (119.5 kg)   SpO2 99%   BMI  35.72 kg/m   SpO2: 99 %  Amb bm nad/ minimal pseudowheeze    HEENT : pt wearing mask not removed for exam due to covid -19 concerns.    NECK :  without JVD/Nodes/TM/ nl carotid upstrokes bilaterally   LUNGS: no acc muscle use,  Nl contour chest with minimal insp/exp rhonchi  bilaterally without cough on insp or exp maneuvers   CV:  RRR  no s3 or murmur or increase in P2, and no edema   ABD:  soft and nontender with nl inspiratory excursion in the supine position. No bruits or organomegaly appreciated, bowel sounds nl  MS:  Nl gait/ ext warm without deformities, calf tenderness, cyanosis or clubbing No obvious joint restrictions   SKIN: warm and dry without lesions    NEURO:  alert, approp, nl sensorium with  no motor or cerebellar deficits apparent.    CXR PA and Lateral:   04/27/2021 :    I personally reviewed images and agree with radiology impression as follows:    No active cardiopulmonary disease.      Assessment   Asthma Onset age 59 - quit smoking 07/10/20  - 04/27/2021  After extensive coaching inhaler device,  effectiveness =    80% > try symbicort 160 2bid and approp saba/ rule of 2s reviewed  Allergy profile 04/27/2021 >  Eos 0.2 /  IgE  pending   Prednisone responsiveness suggests need to start with short course prednisone and  high dose ICS/LABA then do step down therapy if responds appropriately .   Re SABA :  I spent extra time with pt today reviewing appropriate use of albuterol for prn use on exertion with the following points: 1) saba is for relief of sob that does not improve by walking a slower pace or resting but rather if the pt does not improve after trying this first. 2) If the pt is convinced, as many are, that saba helps recover from activity faster then it's easy to tell if this is the case by re-challenging : ie stop, take the inhaler, then p 5 minutes try the exact same activity (intensity of workload) that just caused the symptoms and see if  they are substantially diminished or not after saba 3) if there is an activity that reproducibly causes the symptoms, try the saba 15 min before the activity on alternate days   If in fact the saba really does help, then fine to continue to use it prn but advised may need to look closer at the maintenance regimen being used to achieve better control of airways disease with exertion.   >>> r/u in 6 week with all meds in hand using a trust but verify approach to confirm accurate  Medication  Reconciliation The principal here is that until we are certain that the  patients are doing what we've asked, it makes no sense to ask them to do more.          Each maintenance medication was reviewed in detail including emphasizing most importantly the difference between maintenance and prns and under what circumstances the prns are to be triggered using an action plan format where appropriate.  Total time for H and P, chart review, counseling, reviewing hfa device(s) and generating customized AVS unique to this new pt office visit / same day charting > 45 min           Christinia Gully, MD 04/27/2021

## 2021-04-28 ENCOUNTER — Encounter: Payer: Self-pay | Admitting: Internal Medicine

## 2021-04-28 MED ORDER — BUDESONIDE-FORMOTEROL FUMARATE 160-4.5 MCG/ACT IN AERO: INHALATION_SPRAY | RESPIRATORY_TRACT | Status: DC

## 2021-04-28 NOTE — Assessment & Plan Note (Addendum)
Onset age 35 - quit smoking 07/10/20  - 04/27/2021  After extensive coaching inhaler device,  effectiveness =    80% > try symbicort 160 2bid and approp saba/ rule of 2s reviewed  Allergy profile 04/27/2021 >  Eos 0.2 /  IgE    Prednisone responsiveness suggests need to start with short course prednisone and  high dose ICS/LABA then do step down therapy if responds appropriately .   Re SABA :  I spent extra time with pt today reviewing appropriate use of albuterol for prn use on exertion with the following points: 1) saba is for relief of sob that does not improve by walking a slower pace or resting but rather if the pt does not improve after trying this first. 2) If the pt is convinced, as many are, that saba helps recover from activity faster then it's easy to tell if this is the case by re-challenging : ie stop, take the inhaler, then p 5 minutes try the exact same activity (intensity of workload) that just caused the symptoms and see if they are substantially diminished or not after saba 3) if there is an activity that reproducibly causes the symptoms, try the saba 15 min before the activity on alternate days   If in fact the saba really does help, then fine to continue to use it prn but advised may need to look closer at the maintenance regimen being used to achieve better control of airways disease with exertion.   >>> r/u in 6 week with all meds in hand using a trust but verify approach to confirm accurate Medication  Reconciliation The principal here is that until we are certain that the  patients are doing what we've asked, it makes no sense to ask them to do more.          Each maintenance medication was reviewed in detail including emphasizing most importantly the difference between maintenance and prns and under what circumstances the prns are to be triggered using an action plan format where appropriate.  Total time for H and P, chart review, counseling, reviewing hfa device(s) and  generating customized AVS unique to this new pt office visit / same day charting > 45 min

## 2021-04-30 ENCOUNTER — Other Ambulatory Visit: Payer: Self-pay

## 2021-04-30 MED ORDER — BUDESONIDE-FORMOTEROL FUMARATE 160-4.5 MCG/ACT IN AERO
INHALATION_SPRAY | RESPIRATORY_TRACT | 11 refills | Status: DC
  Filled 2021-04-30: qty 10.2, 30d supply, fill #0
  Filled 2021-06-13: qty 10.2, 30d supply, fill #1

## 2021-05-01 ENCOUNTER — Other Ambulatory Visit: Payer: Self-pay

## 2021-05-01 ENCOUNTER — Telehealth: Payer: Self-pay | Admitting: Pharmacist

## 2021-05-01 NOTE — Telephone Encounter (Signed)
05/01/2021 10:58:55 AM - Symbicort forms to pat. & dr  -- Elmer Picker - Tuesday, May 01, 2021 10:56 AM --Received today from [place] to order Symbicort 160/4.5  Inhale 2 puffs every 12 hours provider-Dr. Shirlee [facility] [place]. [place], [place] patient his portion to sign & return, sending provider portion Interoffice to sign & return. THIS MEDICATION REPLACES ADVAIR.

## 2021-05-02 LAB — IGE: IgE (Immunoglobulin E), Serum: 209 IU/mL (ref 6–495)

## 2021-05-04 ENCOUNTER — Telehealth: Payer: Self-pay | Admitting: Internal Medicine

## 2021-05-04 NOTE — Telephone Encounter (Signed)
Received symbicort Rx from medication management.  Form has been placed in Dr. Gustavus Bryant look at folder for signature. Dr. Melvyn Novas will be back at the [place] office 05/14/2021

## 2021-05-07 ENCOUNTER — Other Ambulatory Visit: Payer: Self-pay

## 2021-05-07 MED ORDER — APIXABAN 5 MG PO TABS: 5.0000 mg | ORAL_TABLET | Freq: Two times a day (BID) | ORAL | 2 refills | Status: DC

## 2021-05-10 ENCOUNTER — Ambulatory Visit: Payer: Medicaid Other | Admitting: Neurology

## 2021-05-14 ENCOUNTER — Telehealth: Payer: Self-pay | Admitting: Pharmacist

## 2021-05-14 NOTE — Telephone Encounter (Signed)
Rx signed and sent to medication management via interoffice.

## 2021-05-14 NOTE — Telephone Encounter (Signed)
Rx given to MW for signature.

## 2021-05-14 NOTE — Telephone Encounter (Signed)
05/14/2021 9:48:17 AM - Symbicort pending-prov portion  -- Elmer Picker - Monday, May 14, 2021 9:47 AM --I have received the signed patient portion of [place] application for Symbicort--holding for provider to return their portion, mailed to provider 05/01/21.

## 2021-05-21 ENCOUNTER — Telehealth: Payer: Self-pay | Admitting: Pharmacist

## 2021-05-21 NOTE — Telephone Encounter (Signed)
05/21/2021 9:44:39 AM - Symbicort faxed to [place] - Monday, May 21, 2021 9:44 AM --Faxed [place] application for Symbicort 160/4.5 for enrollment.

## 2021-06-13 ENCOUNTER — Other Ambulatory Visit: Payer: Self-pay

## 2021-06-20 ENCOUNTER — Ambulatory Visit: Payer: Medicaid Other | Admitting: Gerontology

## 2021-07-03 ENCOUNTER — Ambulatory Visit: Payer: Medicaid Other | Admitting: Internal Medicine

## 2021-07-03 NOTE — Progress Notes (Deleted)
Shane Leach, male    DOB: 1986-04-15     MRN: [id number]   Brief patient profile:  26  yobm  quit smoking 07/10/20 and grew up in house of smokers with onset of asthma around 89 y old and daily inhalers since then  referred to pulmonary clinic in [facility]  04/27/2021 by Dr Renee Rival for asthma.            History of Present Illness  04/27/2021  Pulmonary/ 1st office eval/ [facility] Wojdyla / [facility]  Chief Complaint  Patient presents with   Pulmonary Consult    Referred by Dr. Renee Rival. Pt states has hx of asthma and bronchitis "always had trouble with breathing"- worse since beginning of 2022 when dx with PE- currently on Eliquis.  He c/o SOB with exertion and occ wakes in the night feeling SOB. He also c/o cough and wheezing- using albuterol inhaler several times per day and recently ran out.   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  Prednisone 10 mg take  4 each am x 2 days,   2 each am x 2 days,  1 each am x 2 days and stop   Plan B = Backup (to supplement plan A, not to replace it) Only use your albuterol inhaler as a rescue medication  Plan C = Crisis (instead of Plan B but only if Plan B stops working) - only use your albuterol nebulizer if you first try Plan B and it fails to help > ok to use the nebulizer up to every 4 hours but if start needing it regularly call for immediate appointment Please remember to go to the lab and x-ray department  for your tests - we will call you with the results when they are available. Please schedule a follow up  office visit in 6 weeks, call sooner if needed - bring inhalers with you     07/03/2021  f/u ov/Anatalia Kronk/ [facility] re: ***   maint on ***  No chief complaint on file.   Dyspnea:  *** Cough: *** Sleeping: *** SABA use: *** 02: *** Covid status:   ***   No obvious day to day or daytime variability or assoc excess/ purulent sputum or mucus plugs or hemoptysis or cp or chest tightness, subjective wheeze or overt sinus or hb symptoms.   *** without nocturnal  or early am exacerbation  of respiratory  c/o's or need for noct saba. Also denies any obvious fluctuation of symptoms with weather or environmental changes or other aggravating or alleviating factors except as outlined above   No unusual exposure hx or h/o childhood pna or knowledge of premature birth.  Current Allergies, Complete Past Medical History, Past Surgical History, Family History, and Social History were reviewed in [employer] record.  ROS  The following are not active complaints unless bolded Hoarseness, sore throat, dysphagia, dental problems, itching, sneezing,  nasal congestion or discharge of excess mucus or purulent secretions, ear ache,  fever, chills, sweats, unintended wt loss or wt gain, classically pleuritic or exertional cp,  orthopnea pnd or arm/hand swelling  or leg swelling, presyncope, palpitations, abdominal pain, anorexia, nausea, vomiting, diarrhea  or change in bowel habits or change in bladder habits, change in stools or change in urine, dysuria, hematuria,  rash, arthralgias, visual complaints, headache, numbness, weakness or ataxia or problems with walking or coordination,  change in mood or  memory.        No outpatient medications have been marked as taking for the 07/03/21 encounter (Appointment) with Tanda Rockers, MD.           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

## 2021-07-09 ENCOUNTER — Inpatient Hospital Stay: Payer: Medicaid Other | Admitting: Internal Medicine

## 2021-07-09 ENCOUNTER — Telehealth: Payer: Self-pay | Admitting: Internal Medicine

## 2021-07-09 ENCOUNTER — Inpatient Hospital Stay: Payer: Medicaid Other

## 2021-07-09 NOTE — Telephone Encounter (Signed)
Pt called to reschedule his appt for today. Call back at [phone]

## 2021-07-11 ENCOUNTER — Ambulatory Visit: Payer: Medicaid Other | Admitting: Gerontology

## 2021-07-20 ENCOUNTER — Other Ambulatory Visit: Payer: Self-pay

## 2021-07-20 ENCOUNTER — Inpatient Hospital Stay: Payer: Medicaid Other | Attending: Internal Medicine

## 2021-07-20 ENCOUNTER — Inpatient Hospital Stay: Payer: Medicaid Other

## 2021-07-20 ENCOUNTER — Inpatient Hospital Stay (HOSPITAL_BASED_OUTPATIENT_CLINIC_OR_DEPARTMENT_OTHER): Payer: Medicaid Other | Admitting: Internal Medicine

## 2021-07-20 ENCOUNTER — Encounter: Payer: Self-pay | Admitting: Internal Medicine

## 2021-07-20 DIAGNOSIS — I2699 Other pulmonary embolism without acute cor pulmonale: Secondary | ICD-10-CM

## 2021-07-20 DIAGNOSIS — Z7901 Long term (current) use of anticoagulants: Secondary | ICD-10-CM | POA: Insufficient documentation

## 2021-07-20 DIAGNOSIS — Z79899 Other long term (current) drug therapy: Secondary | ICD-10-CM | POA: Insufficient documentation

## 2021-07-20 LAB — CBC WITH DIFFERENTIAL/PLATELET
Abs Immature Granulocytes: 0.03 10*3/uL (ref 0.00–0.07)
Basophils Absolute: 0.1 10*3/uL (ref 0.0–0.1)
Basophils Relative: 1 %
Eosinophils Absolute: 0.2 10*3/uL (ref 0.0–0.5)
Eosinophils Relative: 2 %
HCT: 50 % (ref 39.0–52.0)
Hemoglobin: 17 g/dL (ref 13.0–17.0)
Immature Granulocytes: 0 %
Lymphocytes Relative: 18 %
Lymphs Abs: 1.7 10*3/uL (ref 0.7–4.0)
MCH: 31.1 pg (ref 26.0–34.0)
MCHC: 34 g/dL (ref 30.0–36.0)
MCV: 91.6 fL (ref 80.0–100.0)
Monocytes Absolute: 0.6 10*3/uL (ref 0.1–1.0)
Monocytes Relative: 6 %
Neutro Abs: 6.6 10*3/uL (ref 1.7–7.7)
Neutrophils Relative %: 73 %
Platelets: 259 10*3/uL (ref 150–400)
RBC: 5.46 MIL/uL (ref 4.22–5.81)
RDW: 13.9 % (ref 11.5–15.5)
WBC: 9.1 10*3/uL (ref 4.0–10.5)
nRBC: 0 % (ref 0.0–0.2)

## 2021-07-20 LAB — COMPREHENSIVE METABOLIC PANEL
ALT: 31 U/L (ref 0–44)
AST: 25 U/L (ref 15–41)
Albumin: 4.6 g/dL (ref 3.5–5.0)
Alkaline Phosphatase: 56 U/L (ref 38–126)
Anion gap: 6 (ref 5–15)
BUN: 15 mg/dL (ref 6–20)
CO2: 24 mmol/L (ref 22–32)
Calcium: 9.3 mg/dL (ref 8.9–10.3)
Chloride: 106 mmol/L (ref 98–111)
Creatinine, Ser: 1.2 mg/dL (ref 0.61–1.24)
GFR, Estimated: >60 mL/min (ref >60)
Glucose, Bld: 102 mg/dL — ABNORMAL HIGH (ref 70–99)
Potassium: 4.2 mmol/L (ref 3.5–5.1)
Sodium: 136 mmol/L (ref 135–145)
Total Bilirubin: 0.4 mg/dL (ref 0.3–1.2)
Total Protein: 7.7 g/dL (ref 6.5–8.1)

## 2021-07-20 NOTE — Progress Notes (Signed)
Patient states he has not taken is Eliquis 5mg  because they stopped sending it to him.

## 2021-07-20 NOTE — Assessment & Plan Note (Addendum)
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   #I think is reasonable to hold off any further anticoagulation especially in the context of his seizures/noncompliance with medications.  #History of grand mal seizure [2022-last March 2022].  I again reminded the importance of continued compliance with antiseizure medication.  # I counseled the patient regarding the risk of DVT PE with a prior history of blood clot.  Also discussed regarding the importance of quitting smoking/and weight loss and the prevention of any further blood clots.  Discussed the importance of close monitoring of symptoms of blood clots which include but not limited to-sudden chest pain/shortness of breath or swelling of the extremities.  Also counseled the patient regarding importance of keeping hydrated/moving during long car rides and flights; and other periods of immobility..  Also discussed regarding importance of informing the physicians in case patient has to have any emergency/elective orthopedic surgeries especially given history of  hypercoaguable state, and consider prophylactic anticoagulation perioperatively.   # 25 minutes face-to-face with the patient discussing the above plan of care; more than 50% of time spent on prognosis/ natural history; counseling and coordination.    # DISPOSITION: # follow up  As needed-  Dr.B

## 2021-07-20 NOTE — Progress Notes (Signed)
[place] NOTE  Patient Care Team: Marguerita Merles, MD as PCP - General (Family Medicine)  CHIEF COMPLAINTS/PURPOSE OF CONSULTATION: DVT/PE  # FEB 22nd, 2022- Positive for small pulmonary emboli in right lower lobe branches with associated posterior basal segment lower lobe pulmonary infarct and a small layering right pleural effusion. Other bilateral pulmonary arteries appear patent. No central or saddle embolus. Family history: mother; no other risk factors.  On Eliquis; May 2022 factor V Leiden prothrombin/antiphospholipid antibody negative; stopped in dec 2022 [financial; BMS- stopped]   # Hx of Seizues ? 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     HISTORY OF PRESENTING ILLNESS:  Shane Leach 36 y.o.  male with  FEB 2022- right lower lobe pulmonary embolus on eliquis is here for a follow up.  Patient unfortunately ran out of Eliquis approximately 2 months ago. BMS-stopped sending the prescription.  And patient did not call us to inform that he ran out of prescription.  Last seizure of march, 2022. ? Compliance with keppra.  Denies any unusual shortness of breath or cough.  Denies chest pain.   Review of Systems  Constitutional:  Negative for chills, diaphoresis, fever, malaise/fatigue and weight loss.  HENT:  Negative for nosebleeds and sore throat.   Eyes:  Negative for double vision.  Respiratory:  Negative for cough, hemoptysis and sputum production.   Cardiovascular:  Negative for palpitations, orthopnea and leg swelling.  Gastrointestinal:  Negative for abdominal pain, blood in stool, constipation, diarrhea, heartburn, melena, nausea and vomiting.  Genitourinary:  Negative for dysuria, frequency and urgency.  Musculoskeletal:  Positive for back pain and joint pain.  Skin: Negative.  Negative for itching and rash.  Neurological:  Negative for  dizziness, tingling, focal weakness, weakness and headaches.  Endo/Heme/Allergies:  Does not bruise/bleed easily.  Psychiatric/Behavioral:  Negative for depression. The patient is not nervous/anxious and does not have insomnia.     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)    EtOH dependence ([place])    In remission   Pulmonary embolism ([place]) 06/19/2020   Seizures ([place])     SURGICAL HISTORY: Past Surgical History:  Procedure Laterality Date   testicular cancer     right testicle removed    SOCIAL HISTORY: Social History   Socioeconomic History   Marital status: Married    Spouse name: Not on file   Number of children: Not on file   Years of education: Not on file   Highest education level: Not on file  Occupational History   Not on file  Tobacco Use   Smoking status: Former    Packs/day: 0.50    Years: 15.00    Pack years: 7.50    Types: Cigarettes    Quit date: 07/10/2020    Years since quitting: 1.0   Smokeless tobacco: Never  Vaping Use   Vaping Use: Never used  Substance and Sexual Activity   Alcohol use: Yes    Alcohol/week: 14.0 standard drinks    Types: 14 Cans of beer per week    Comment: 04/19/21 drinks 2-3 beers on Friday and Saturday. drinks 1-2 16 oz cans of beer daily   Drug use: Not Currently    Types: Marijuana  Comment: used marijuana in the past, last use 06/2020   Sexual activity: Yes    Partners: Female    Birth control/protection: Condom    Comment: in a monogamous relationship  Other Topics Concern   Not on file  Social History Narrative   Lives in [place]; 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 Drinks 2 16 ounce beers/day.    Social Determinants of Health   Financial Resource Strain: Medium Risk   Difficulty of Paying Living Expenses: Somewhat hard  Food Insecurity: No Food Insecurity    Worried About Charity fundraiser in the Last Year: Never true   Ran Out of Food in the Last Year: Never true  Transportation Needs: No Transportation Needs   Lack of Transportation (Medical): No   Lack of Transportation (Non-Medical): No  Physical Activity: Sufficiently Active   Days of Exercise per Week: 3 days   Minutes of Exercise per Session: 120 min  Stress: No Stress Concern Present   Feeling of Stress : Only a little  Social Connections: Socially Isolated   Frequency of Communication with Friends and Family: Twice a week   Frequency of Social Gatherings with Friends and Family: Once a week   Attends Religious Services: Never   Marine scientist or Organizations: No   Attends Music therapist: Never   Marital Status: Separated  Human resources officer Violence: Not on file    FAMILY HISTORY: Family History  Problem Relation Age of Onset   Stroke Mother    Deep vein thrombosis Mother    Diabetes Mother    Hypertension Mother    Cancer Father        lung   Cancer Maternal Grandmother    Stroke Maternal Grandfather     ALLERGIES:  is allergic to dilantin [phenytoin sodium extended], penicillins, phenytoin, tegretol [carbamazepine], and iodinated contrast media.  MEDICATIONS:  Current Outpatient Medications  Medication Sig Dispense Refill   acetaminophen (TYLENOL) 325 MG tablet Take 650 mg by mouth every 6 (six) hours as needed.     albuterol (PROVENTIL HFA) 108 (90 Base) MCG/ACT inhaler Inhale 2 puffs into the lungs every 4 (four) hours as needed for wheezing or shortness of breath. 6.7 g 3   albuterol (PROVENTIL) (2.5 MG/3ML) 0.083% nebulizer solution Inhale 3 mLs (2.5 mg total) by nebulization once every 4 (four) hours as needed for wheezing or shortness of breath. 90 mL 1   budesonide-formoterol (SYMBICORT) 160-4.5 MCG/ACT inhaler Take 2 puffs first thing in am and then another 2 puffs about 12 hours later.     budesonide-formoterol (SYMBICORT) 160-4.5  MCG/ACT inhaler INHALE 2 PUFFS INTO THE LUNGS ONCE EVERY 12 HOURS. 10.2 g 11   levETIRAcetam (KEPPRA) 1000 MG tablet TAKE ONE TABLET BY MOUTH 2 TIMES A DAY 60 tablet 3   apixaban (ELIQUIS) 5 MG TABS tablet Take 1 tablet (5 mg total) by mouth 2 (two) times daily. (Patient not taking: Reported on 07/20/2021) 60 tablet 2   predniSONE (DELTASONE) 10 MG tablet TAKE 4 TABLETS (40MG ) BY MOUTH ONCE EVERY MORNING FOR 2 DAYS. THEN TAKE 2 TABS (20MG ) ONCE EVERY MORNING FOR 2 DAYS. THEN TAKE 1 TAB ONCE EVERY MORNING FOR 2 DAYS. (Patient not taking: Reported on 07/20/2021) 14 tablet 0   No current facility-administered medications for this visit.      Marland Kitchen  PHYSICAL EXAMINATION:  Vitals:   07/20/21 1510  BP: (!) 144/98  Pulse: 75  Resp: 20  Temp:  98.7 F (37.1 C)  SpO2: 100%   Filed Weights   07/20/21 1510  Weight: 270 lb 4.8 oz (122.6 kg)    Physical Exam Constitutional:      Comments: Patient is alone.  Ambulating independently  HENT:     Head: Normocephalic and atraumatic.     Mouth/Throat:     Pharynx: No oropharyngeal exudate.  Eyes:     Pupils: Pupils are equal, round, and reactive to light.  Cardiovascular:     Rate and Rhythm: Normal rate and regular rhythm.  Pulmonary:     Effort: Pulmonary effort is normal. No respiratory distress.     Breath sounds: Normal breath sounds. No wheezing.  Abdominal:     General: Bowel sounds are normal. There is no distension.     Palpations: Abdomen is soft. There is no mass.     Tenderness: There is no abdominal tenderness. There is no guarding or rebound.  Musculoskeletal:        General: No tenderness. Normal range of motion.     Cervical back: Normal range of motion and neck supple.  Skin:    General: Skin is warm.  Neurological:     Mental Status: He is alert and oriented to person, place, and time.  Psychiatric:        Mood and Affect: Affect normal.     LABORATORY DATA:  I have reviewed the data as listed Lab Results   Component Value Date   WBC 9.1 07/20/2021   HGB 17.0 07/20/2021   HCT 50.0 07/20/2021   MCV 91.6 07/20/2021   PLT 259 07/20/2021   Recent Labs    10/26/20 1524 11/28/20 1010 11/29/20 1023 03/14/21 1057 04/06/21 1358 07/20/21 1458  NA  --  138   < > 140 136 136  K  --  4.3   < > 4.7 4.3 4.2  CL  --  103   < > 103 102 106  CO2  --  24   < > 22 26 24   GLUCOSE  --  100*   < > 91 100* 102*  BUN  --  15   < > 8 9 15   CREATININE  --  1.24   < > 1.32* 1.15 1.20  CALCIUM  --  9.6   < > 10.1 9.6 9.3  GFRNONAA  --  >60  --   --  >60 >60  PROT 7.0 7.8   < > 7.5 7.8 7.7  ALBUMIN 3.9 4.6   < > 5.1* 4.7 4.6  AST 37 37   < > 28 26 25   ALT 38 40   < > 37 35 31  ALKPHOS 64 61   < > 84 60 56  BILITOT 0.8 0.9   < > 0.8 1.1 0.4  BILIDIR <0.1  --   --   --   --   --   IBILI NOT CALCULATED  --   --   --   --   --    < > = values in this interval not displayed.    RADIOGRAPHIC STUDIES: I have personally reviewed the radiological images as listed and agreed with the findings in the report. No results found.   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   #I think is reasonable to hold off any further anticoagulation especially in the context of his seizures/noncompliance with medications.  #  History of grand mal seizure [2022-last March 2022].  I again reminded the importance of continued compliance with antiseizure medication.  # I counseled the patient regarding the risk of DVT PE with a prior history of blood clot.  Also discussed regarding the importance of quitting smoking/and weight loss and the prevention of any further blood clots.  Discussed the importance of close monitoring of symptoms of blood clots which include but not limited to-sudden chest pain/shortness of breath or swelling of the extremities.  Also counseled the patient regarding importance of  keeping hydrated/moving during long car rides and flights; and other periods of immobility..  Also discussed regarding importance of informing the physicians in case patient has to have any emergency/elective orthopedic surgeries especially given history of  hypercoaguable state, and consider prophylactic anticoagulation perioperatively.   # 25 minutes face-to-face with the patient discussing the above plan of care; more than 50% of time spent on prognosis/ natural history; counseling and coordination.    # DISPOSITION: # follow up  As needed-  Dr.B  All questions were answered. The patient knows to call the clinic with any problems, questions or concerns.       Cammie Sickle, MD 07/20/2021 4:07 PM

## 2021-07-30 ENCOUNTER — Other Ambulatory Visit: Payer: Self-pay

## 2021-10-12 ENCOUNTER — Other Ambulatory Visit: Payer: Self-pay

## 2021-12-06 ENCOUNTER — Other Ambulatory Visit: Payer: Self-pay

## 2021-12-12 ENCOUNTER — Other Ambulatory Visit: Payer: Self-pay

## 2022-01-18 ENCOUNTER — Other Ambulatory Visit: Payer: Self-pay

## 2022-03-11 ENCOUNTER — Emergency Department: Payer: Medicaid Other

## 2022-03-11 ENCOUNTER — Other Ambulatory Visit: Payer: Self-pay

## 2022-03-11 ENCOUNTER — Emergency Department
Admission: EM | Admit: 2022-03-11 | Discharge: 2022-03-11 | Disposition: A | Discharge status: Home / Self Care | Payer: Medicaid Other | Attending: Emergency Medicine | Admitting: Emergency Medicine

## 2022-03-11 ENCOUNTER — Encounter: Payer: Self-pay | Admitting: Emergency Medicine

## 2022-03-11 DIAGNOSIS — Z720 Tobacco use: Secondary | ICD-10-CM | POA: Insufficient documentation

## 2022-03-11 DIAGNOSIS — R079 Chest pain, unspecified: Secondary | ICD-10-CM | POA: Insufficient documentation

## 2022-03-11 LAB — CBC
HCT: 53.1 % — ABNORMAL HIGH (ref 39.0–52.0)
Hemoglobin: 17.4 g/dL — ABNORMAL HIGH (ref 13.0–17.0)
MCH: 30.8 pg (ref 26.0–34.0)
MCHC: 32.8 g/dL (ref 30.0–36.0)
MCV: 94 fL (ref 80.0–100.0)
Platelets: 269 10*3/uL (ref 150–400)
RBC: 5.65 MIL/uL (ref 4.22–5.81)
RDW: 13.6 % (ref 11.5–15.5)
WBC: 6.5 10*3/uL (ref 4.0–10.5)
nRBC: 0 % (ref 0.0–0.2)

## 2022-03-11 LAB — COMPREHENSIVE METABOLIC PANEL
ALT: 37 U/L (ref 0–44)
AST: 34 U/L (ref 15–41)
Albumin: 4.9 g/dL (ref 3.5–5.0)
Alkaline Phosphatase: 67 U/L (ref 38–126)
Anion gap: 5 (ref 5–15)
BUN: 12 mg/dL (ref 6–20)
CO2: 27 mmol/L (ref 22–32)
Calcium: 9.8 mg/dL (ref 8.9–10.3)
Chloride: 106 mmol/L (ref 98–111)
Creatinine, Ser: 1.2 mg/dL (ref 0.61–1.24)
GFR, Estimated: >60 mL/min (ref >60)
Glucose, Bld: 99 mg/dL (ref 70–99)
Potassium: 4.7 mmol/L (ref 3.5–5.1)
Sodium: 138 mmol/L (ref 135–145)
Total Bilirubin: 0.8 mg/dL (ref 0.3–1.2)
Total Protein: 8.2 g/dL — ABNORMAL HIGH (ref 6.5–8.1)

## 2022-03-11 LAB — TROPONIN I (HIGH SENSITIVITY): Troponin I (High Sensitivity): 4 ng/L (ref <18)

## 2022-03-11 MED ORDER — ASPIRIN 325 MG PO TBEC
325.0000 mg | DELAYED_RELEASE_TABLET | Freq: Once | ORAL | Status: AC
  Administered 2022-03-11: 325 mg via ORAL
  Filled 2022-03-11: qty 1

## 2022-03-11 MED ORDER — OMEPRAZOLE MAGNESIUM 20 MG PO TBEC: 20.0000 mg | DELAYED_RELEASE_TABLET | Freq: Every day | ORAL | 0 refills | Status: DC

## 2022-03-11 MED ORDER — FAMOTIDINE IN NACL 20-0.9 MG/50ML-% IV SOLN
20.0000 mg | Freq: Once | INTRAVENOUS | Status: AC
  Administered 2022-03-11: 20 mg via INTRAVENOUS
  Filled 2022-03-11: qty 50

## 2022-03-11 MED ORDER — IOHEXOL 350 MG/ML SOLN
75.0000 mL | Freq: Once | INTRAVENOUS | Status: AC | PRN
  Administered 2022-03-11: 75 mL via INTRAVENOUS

## 2022-03-11 MED ORDER — KETOROLAC TROMETHAMINE 15 MG/ML IJ SOLN
15.0000 mg | Freq: Once | INTRAMUSCULAR | Status: AC
  Administered 2022-03-11: 15 mg via INTRAVENOUS
  Filled 2022-03-11: qty 1

## 2022-03-11 MED ORDER — METHYLPREDNISOLONE SODIUM SUCC 40 MG IJ SOLR
40.0000 mg | Freq: Once | INTRAMUSCULAR | Status: AC
  Administered 2022-03-11: 40 mg via INTRAVENOUS
  Filled 2022-03-11: qty 1

## 2022-03-11 MED ORDER — DIPHENHYDRAMINE HCL 50 MG/ML IJ SOLN
50.0000 mg | Freq: Once | INTRAMUSCULAR | Status: AC
  Administered 2022-03-11: 50 mg via INTRAVENOUS
  Filled 2022-03-11: qty 1

## 2022-03-11 NOTE — ED Notes (Signed)
Pt transported XR at this time.

## 2022-03-11 NOTE — ED Triage Notes (Signed)
C/O left side chest pain, left arm numbness, and sob since Saturday night.

## 2022-03-11 NOTE — ED Notes (Signed)
Pt transported to CT at this time.

## 2022-03-11 NOTE — ED Provider Notes (Signed)
[facility] Provider Note    Event Date/Time   First MD Initiated Contact with Patient 03/11/22 [phone]     (approximate)   History   Chest Pain   HPI  Shane Leach is a 36 y.o. male with past medical history significant for pulmonary embolism no longer on anticoagulation who presents to the emergency department with chest pain.  Endorses a sharp chest pain sensation that started last night.  States that it was ongoing since that time.  Denies any change with position or exertion.  Denies any significant shortness of breath.  Endorses a burning sensation.  Denies any nausea or vomiting.  Does endorse tobacco use.  Endorses alcohol use.  No longer on anticoagulation for his pulmonary embolism.  No family history of coronary artery disease at a young age.     Physical Exam   Triage Vital Signs: ED Triage Vitals  Enc Vitals Group     BP 03/11/22 0850 128/83     Pulse Rate 03/11/22 0850 89     Resp 03/11/22 0850 16     Temp 03/11/22 0850 98.5 F (36.9 C)     Temp Source 03/11/22 0850 Oral     SpO2 03/11/22 0850 97 %     Weight 03/11/22 0842 270 lb 4.5 oz (122.6 kg)     Height 03/11/22 0842 6' (1.829 m)     Head Circumference --      Peak Flow --      Pain Score 03/11/22 0841 10     Pain Loc --      Pain Edu? --      Excl. in [place]? --     Most recent vital signs: Vitals:   03/11/22 1200 03/11/22 1330  BP: 121/71 (!) 132/94  Pulse: 61 64  Resp: 14 19  Temp:    SpO2: 99% 95%     General: Awake, no distress.  Lying in bed CV:  Good peripheral perfusion.  Resp:  Normal effort.  Lungs clear to auscultation Abd:  No distention.  Nontender to palpation Other:  No lower extremity edema   ED Results / Procedures / Treatments   Labs (all labs ordered are listed, but only abnormal results are displayed) Labs Reviewed  CBC - Abnormal; Notable for the following components:      Result Value   Hemoglobin 17.4 (*)    HCT 53.1 (*)    All other  components within normal limits  COMPREHENSIVE METABOLIC PANEL - Abnormal; Notable for the following components:   Total Protein 8.2 (*)    All other components within normal limits  TROPONIN I (HIGH SENSITIVITY)     EKG  Normal sinus rhythm.  Normal intervals.  No chamber enlargement.  No significant ST elevation or depression.  No signs of acute ischemia or dysrhythmia.  No tachycardic or bradycardic dysrhythmias while on cardiac telemetry in the emergency department.   RADIOLOGY Independently reviewed CT angiography and showed no signs of pulmonary embolism.  No signs of pneumonia.  Was read as no acute findings.    PROCEDURES:  Critical Care performed: No  Procedures   MEDICATIONS ORDERED IN ED: Medications  methylPREDNISolone sodium succinate (SOLU-MEDROL) 40 mg/mL injection 40 mg (40 mg Intravenous Given 03/11/22 0958)  diphenhydrAMINE (BENADRYL) injection 50 mg (50 mg Intravenous Given 03/11/22 1250)  aspirin EC tablet 325 mg (325 mg Oral Given 03/11/22 1137)  ketorolac (TORADOL) 15 MG/ML injection 15 mg (15 mg Intravenous Given 03/11/22 1250)  famotidine (PEPCID) IVPB 20 mg premix (0 mg Intravenous Stopped 03/11/22 1359)  iohexol (OMNIPAQUE) 350 MG/ML injection 75 mL (75 mLs Intravenous Contrast Given 03/11/22 1413)     IMPRESSION / MDM / ASSESSMENT AND PLAN / ED COURSE  I reviewed the triage vital signs and the nursing notes.   Differential diagnosis includes, but is not limited to, ACS, pulmonary embolism, pneumonia, gastritis  Patient low risk heart score.  Given moderate risk Wells criteria obtained a CTA.  Patient does have a contrast allergy so was pretreated with Benadryl and steroids.  Patient had CT angiography with no immediate complications.  CTA showed no signs of pulmonary embolism.  No signs of pneumonia.  Low risk heart score with undetectable troponin and chest pain that has been ongoing for more than 6 hours.  No significant anemia.  Patient was given  treatment with Pepcid  On reevaluation had significant improvement of his symptoms.  Possibly gastritis or musculoskeletal.  Patient will follow-up with primary care provider and given return precautions for any worsening symptoms.  We will start the patient on PPI.  Repeat exam with no significant tenderness of the abdomen have a low suspicion for symptomatic cholelithiasis or acute cholecystitis.  Patient's presentation is most consistent with acute presentation with potential threat to life or bodily function.       FINAL CLINICAL IMPRESSION(S) / ED DIAGNOSES   Final diagnoses:  Chest pain, unspecified type     Rx / DC Orders   ED Discharge Orders          Ordered    omeprazole (PRILOSEC OTC) 20 MG tablet  Daily        03/11/22 1456             Note:  This document was prepared using Dragon voice recognition software and may include unintentional dictation errors.   Nathaniel Man, MD 03/11/22 [phone]

## 2022-03-11 NOTE — ED Notes (Addendum)
Pt via POV from home. Pt c/o L sided chest pain since Saturday night. States pain is sharp and constant. States that the pain has not gotten any better. Pt has a hx PE in 2022, pt no longer takes blood thinners. States that he also SOB and has a productive cough with clear mucus. Denies any NV. Denies any diaphoresis. Pt is A&Ox4 and NAD.

## 2022-03-27 ENCOUNTER — Emergency Department: Payer: Medicaid Other

## 2022-03-27 ENCOUNTER — Emergency Department
Admission: EM | Admit: 2022-03-27 | Discharge: 2022-03-27 | Disposition: A | Discharge status: Home / Self Care | Payer: Medicaid Other | Attending: Emergency Medicine | Admitting: Emergency Medicine

## 2022-03-27 ENCOUNTER — Other Ambulatory Visit: Payer: Self-pay

## 2022-03-27 DIAGNOSIS — Z1152 Encounter for screening for COVID-19: Secondary | ICD-10-CM | POA: Insufficient documentation

## 2022-03-27 DIAGNOSIS — J4521 Mild intermittent asthma with (acute) exacerbation: Secondary | ICD-10-CM | POA: Insufficient documentation

## 2022-03-27 DIAGNOSIS — J45909 Unspecified asthma, uncomplicated: Secondary | ICD-10-CM

## 2022-03-27 LAB — CBC WITH DIFFERENTIAL/PLATELET
Abs Immature Granulocytes: 0.03 10*3/uL (ref 0.00–0.07)
Basophils Absolute: 0 10*3/uL (ref 0.0–0.1)
Basophils Relative: 1 %
Eosinophils Absolute: 0.4 10*3/uL (ref 0.0–0.5)
Eosinophils Relative: 6 %
HCT: 48.7 % (ref 39.0–52.0)
Hemoglobin: 16.3 g/dL (ref 13.0–17.0)
Immature Granulocytes: 0 %
Lymphocytes Relative: 12 %
Lymphs Abs: 0.9 10*3/uL (ref 0.7–4.0)
MCH: 30.5 pg (ref 26.0–34.0)
MCHC: 33.5 g/dL (ref 30.0–36.0)
MCV: 91.2 fL (ref 80.0–100.0)
Monocytes Absolute: 0.7 10*3/uL (ref 0.1–1.0)
Monocytes Relative: 9 %
Neutro Abs: 5.5 10*3/uL (ref 1.7–7.7)
Neutrophils Relative %: 72 %
Platelets: 237 10*3/uL (ref 150–400)
RBC: 5.34 MIL/uL (ref 4.22–5.81)
RDW: 13.1 % (ref 11.5–15.5)
WBC: 7.5 10*3/uL (ref 4.0–10.5)
nRBC: 0 % (ref 0.0–0.2)

## 2022-03-27 LAB — COMPREHENSIVE METABOLIC PANEL
ALT: 30 U/L (ref 0–44)
AST: 28 U/L (ref 15–41)
Albumin: 4.5 g/dL (ref 3.5–5.0)
Alkaline Phosphatase: 60 U/L (ref 38–126)
Anion gap: 7 (ref 5–15)
BUN: 11 mg/dL (ref 6–20)
CO2: 24 mmol/L (ref 22–32)
Calcium: 9.5 mg/dL (ref 8.9–10.3)
Chloride: 107 mmol/L (ref 98–111)
Creatinine, Ser: 1.31 mg/dL — ABNORMAL HIGH (ref 0.61–1.24)
GFR, Estimated: >60 mL/min (ref >60)
Glucose, Bld: 103 mg/dL — ABNORMAL HIGH (ref 70–99)
Potassium: 4.4 mmol/L (ref 3.5–5.1)
Sodium: 138 mmol/L (ref 135–145)
Total Bilirubin: 0.6 mg/dL (ref 0.3–1.2)
Total Protein: 7.8 g/dL (ref 6.5–8.1)

## 2022-03-27 LAB — TROPONIN I (HIGH SENSITIVITY): Troponin I (High Sensitivity): 6 ng/L (ref <18)

## 2022-03-27 LAB — D-DIMER, QUANTITATIVE: D-Dimer, Quant: <0.27 ug/mL-FEU (ref 0.00–0.50)

## 2022-03-27 LAB — RESP PANEL BY RT-PCR (FLU A&B, COVID) ARPGX2
Influenza A by PCR: NEGATIVE
Influenza B by PCR: NEGATIVE
SARS Coronavirus 2 by RT PCR: NEGATIVE

## 2022-03-27 MED ORDER — IPRATROPIUM-ALBUTEROL 0.5-2.5 (3) MG/3ML IN SOLN
3.0000 mL | Freq: Once | RESPIRATORY_TRACT | Status: AC
  Administered 2022-03-27: 3 mL via RESPIRATORY_TRACT
  Filled 2022-03-27: qty 3

## 2022-03-27 MED ORDER — ACETAMINOPHEN 500 MG PO TABS
1000.0000 mg | ORAL_TABLET | Freq: Once | ORAL | Status: AC
  Administered 2022-03-27: 1000 mg via ORAL
  Filled 2022-03-27: qty 2

## 2022-03-27 MED ORDER — METHYLPREDNISOLONE SODIUM SUCC 125 MG IJ SOLR
125.0000 mg | Freq: Once | INTRAMUSCULAR | Status: AC
  Administered 2022-03-27: 125 mg via INTRAVENOUS
  Filled 2022-03-27: qty 2

## 2022-03-27 MED ORDER — DIPHENHYDRAMINE HCL 25 MG PO CAPS: 50.0000 mg | ORAL_CAPSULE | Freq: Once | ORAL | Status: DC

## 2022-03-27 MED ORDER — BENZONATATE 100 MG PO CAPS
100.0000 mg | ORAL_CAPSULE | Freq: Three times a day (TID) | ORAL | 0 refills | Status: DC | PRN
  Filled 2022-03-27: qty 30, 10d supply, fill #0

## 2022-03-27 MED ORDER — DIPHENHYDRAMINE HCL 50 MG/ML IJ SOLN: 50.0000 mg | Freq: Once | INTRAMUSCULAR | Status: DC

## 2022-03-27 MED ORDER — METHYLPREDNISOLONE SODIUM SUCC 40 MG IJ SOLR: 40.0000 mg | Freq: Once | INTRAMUSCULAR | Status: DC

## 2022-03-27 MED ORDER — ALBUTEROL SULFATE HFA 108 (90 BASE) MCG/ACT IN AERS
2.0000 | INHALATION_SPRAY | RESPIRATORY_TRACT | 3 refills | Status: DC | PRN
  Filled 2022-03-27: qty 6.7, 25d supply, fill #0
  Filled 2022-05-20: qty 6.7, 25d supply, fill #1

## 2022-03-27 MED ORDER — ALBUTEROL SULFATE (2.5 MG/3ML) 0.083% IN NEBU
2.5000 mg | INHALATION_SOLUTION | RESPIRATORY_TRACT | 1 refills | Status: DC | PRN
  Filled 2022-03-27: qty 75, 5d supply, fill #0
  Filled 2022-05-20: qty 75, 5d supply, fill #1

## 2022-03-27 MED ORDER — PREDNISONE 20 MG PO TABS
40.0000 mg | ORAL_TABLET | Freq: Every day | ORAL | 0 refills | Status: AC
  Filled 2022-03-27: qty 10, 5d supply, fill #0

## 2022-03-27 NOTE — ED Triage Notes (Signed)
Pt in with co shob states x 2 days hx of asthma. Has been out of nebulizer for 2 months, has also had recent cold symptoms.

## 2022-03-27 NOTE — ED Provider Notes (Signed)
[facility] Provider Note    Event Date/Time   First MD Initiated Contact with Patient 03/27/22 [phone]     (approximate)   History   Asthma   HPI  Shane Leach is a 36 y.o. male with history of prior pulmonary embolism no longer on anticoagulation who comes in with concerns for shortness of breath.   I reviewed patient's note from 03/11/2022 where patient was seen and had a CT PE done without any evidence of pulmonary embolism.  Patient did have contrast prep due to contrast allergy.  I also reviewed the note from 07/20/2021 by oncology where he is off anticoag.   Patient was here 2 weeks ago reports an acute change over the past 2 to 3 days.  + cough and + congestion + sick contact at home his daughter who is 35 months old.  He reports increasing shortness of breath and feeling a significant tightness in his lungs.  He denies any known fevers.  He reports being worried that he could have a pulmonary embolism but does state this feeel like his asthma more then PE.Marland Kitchen  He states that he does not have his normal inhalers but has been trying a different inhaler without resolution of his shortness of breath.  He also ran out of his albuterol nebulizer for the past few months.  He denies any new leg swelling.  Denies any known fevers.  Denies any leg swelling   Physical Exam   Triage Vital Signs: ED Triage Vitals  Enc Vitals Group     BP 03/27/22 0718 (!) 120/92     Pulse Rate 03/27/22 0718 95     Resp 03/27/22 0718 18     Temp 03/27/22 0718 98.2 F (36.8 C)     Temp src --      SpO2 03/27/22 0718 94 %     Weight 03/27/22 0719 270 lb (122.5 kg)     Height 03/27/22 0719 6' (1.829 m)     Head Circumference --      Peak Flow --      Pain Score 03/27/22 0719 8     Pain Loc --      Pain Edu? --      Excl. in [place]? --     Most recent vital signs: Vitals:   03/27/22 0718  BP: (!) 120/92  Pulse: 95  Resp: 18  Temp: 98.2 F (36.8 C)  SpO2: 94%      Shane: Awake, no distress.  CV:  Good peripheral perfusion.  Resp:  Increased work of breathing with diffuse wheezing bilaterally. Abd:  No distention.  Soft and nontender Other:  No swelling the legs.  No calf tenderness   ED Results / Procedures / Treatments   Labs (all labs ordered are listed, but only abnormal results are displayed) Labs Reviewed  RESP PANEL BY RT-PCR (FLU A&B, COVID) [id number]  CBC WITH DIFFERENTIAL/PLATELET  COMPREHENSIVE METABOLIC PANEL  D-DIMER, QUANTITATIVE  URINALYSIS, ROUTINE W REFLEX MICROSCOPIC  TROPONIN I (HIGH SENSITIVITY)     EKG  My interpretation of EKG:  Normal sinus rate 97 without any ST elevation, T wave version lead III, normal intervals  RADIOLOGY I have reviewed the xray personally and interpreted and no signs of pneumonia  PROCEDURES:  Critical Care performed: No  Procedures   MEDICATIONS ORDERED IN ED: Medications  diphenhydrAMINE (BENADRYL) capsule 50 mg (has no administration in time range)    Or  diphenhydrAMINE (BENADRYL) injection 50 mg (  has no administration in time range)  ipratropium-albuterol (DUONEB) 0.5-2.5 (3) MG/3ML nebulizer solution 3 mL (3 mLs Nebulization Given 03/27/22 0745)  ipratropium-albuterol (DUONEB) 0.5-2.5 (3) MG/3ML nebulizer solution 3 mL (3 mLs Nebulization Given 03/27/22 0745)  acetaminophen (TYLENOL) tablet 1,000 mg (1,000 mg Oral Given 03/27/22 0747)  methylPREDNISolone sodium succinate (SOLU-MEDROL) 125 mg/2 mL injection 125 mg (125 mg Intravenous Given 03/27/22 0741)  ipratropium-albuterol (DUONEB) 0.5-2.5 (3) MG/3ML nebulizer solution 3 mL (3 mLs Nebulization Given 03/27/22 0844)     IMPRESSION / MDM / ASSESSMENT AND PLAN / ED COURSE  I reviewed the triage vital signs and the nursing notes.   Patient's presentation is most consistent with acute presentation with potential threat to life or bodily function.   Differential includes asthma exacerbation given patient's history of  asthma but also consider pulmonary embolism although he did have a CT scan recently he does report worsening symptoms. Patient is tachycardic intermittently  maybe from albuterol? and oxygen levels are 91 to 92%.  Given recent CT scan we will start off with D-dimer while prepping patient for possible contrast with Solu-Medrol as well as trial some DuoNebs, test for COVID ACS and get a chest x-ray to evaluate for any pulmonary edema or effusion  Reevaluated patient.  He reports feeling better.  Heart rates have come down but patient's oxygen level remains 94$.  We will give an additional DuoNeb and do ambulatory trial  COVID-negative.  CBC reassuring CMP shows slightly elevated creatinine troponin negative D-dimer is negative  On repeat evaluation patient has normal ambulatory sat.  He is feeling much better after the breathing treatments.  Discussed with patient his EKG did look a little bit abnormal with a new T wave inversion in lead 3 this could be secondary to asthma versus PE.  We discussed his moderate Wells score and his negative D-dimer and his recent CT imaging.  After discussion with both him and his wife they did not want to do additional CT today given they attribute this all to has missed breathing treatments for the past 2 months but they understand that if he develops shortness of breath that is worsening that he should return to the ER for repeat evaluation at that time.  Patient's oxygen levels are 96% he is breathing much more comfortably with the breathing treatments and he feels much better.      FINAL CLINICAL IMPRESSION(S) / ED DIAGNOSES   Final diagnoses:  Exacerbation of intermittent asthma, unspecified asthma severity     Rx / DC Orders   ED Discharge Orders          Ordered    predniSONE (DELTASONE) 20 MG tablet  Daily with breakfast        03/27/22 0945    albuterol (PROVENTIL HFA) 108 (90 Base) MCG/ACT inhaler  Every 4 hours PRN        03/27/22 0945     albuterol (PROVENTIL) (2.5 MG/3ML) 0.083% nebulizer solution  Every 4 hours PRN        03/27/22 0945    benzonatate (TESSALON PERLES) 100 MG capsule  3 times daily PRN        03/27/22 0945             Note:  This document was prepared using Dragon voice recognition software and may include unintentional dictation errors.   Vanessa Dugger, MD 03/27/22 [phone]

## 2022-03-27 NOTE — Discharge Instructions (Addendum)
Your work-up was reassuring without any evidence of blood clot or heart attack I suspect this is all related to your asthma.  However you are high risk for developing blood clots and we discussed CT PE to ensure no evidence of blood clot but after further discussion with you and your wife we have opted to hold off given recent CT imaging and you feeling much better with the medications.  However you should return to the ER if you develop worsening symptoms or any other concerns- otherwise follow up with PCP.

## 2022-03-28 ENCOUNTER — Other Ambulatory Visit: Payer: Self-pay

## 2022-04-02 ENCOUNTER — Other Ambulatory Visit: Payer: Self-pay

## 2022-04-09 IMAGING — CR DG CHEST 2V
1 series · 2 of 2 positions shown · non-contrast
Comparison: June 08, 2019.

CLINICAL DATA: Shortness of breath

EXAM:
CHEST - 2 VIEW

[Series 1: dg chest 2 view · 0.14mm/px · 2 of 2 slices shown]
[im 1/2]
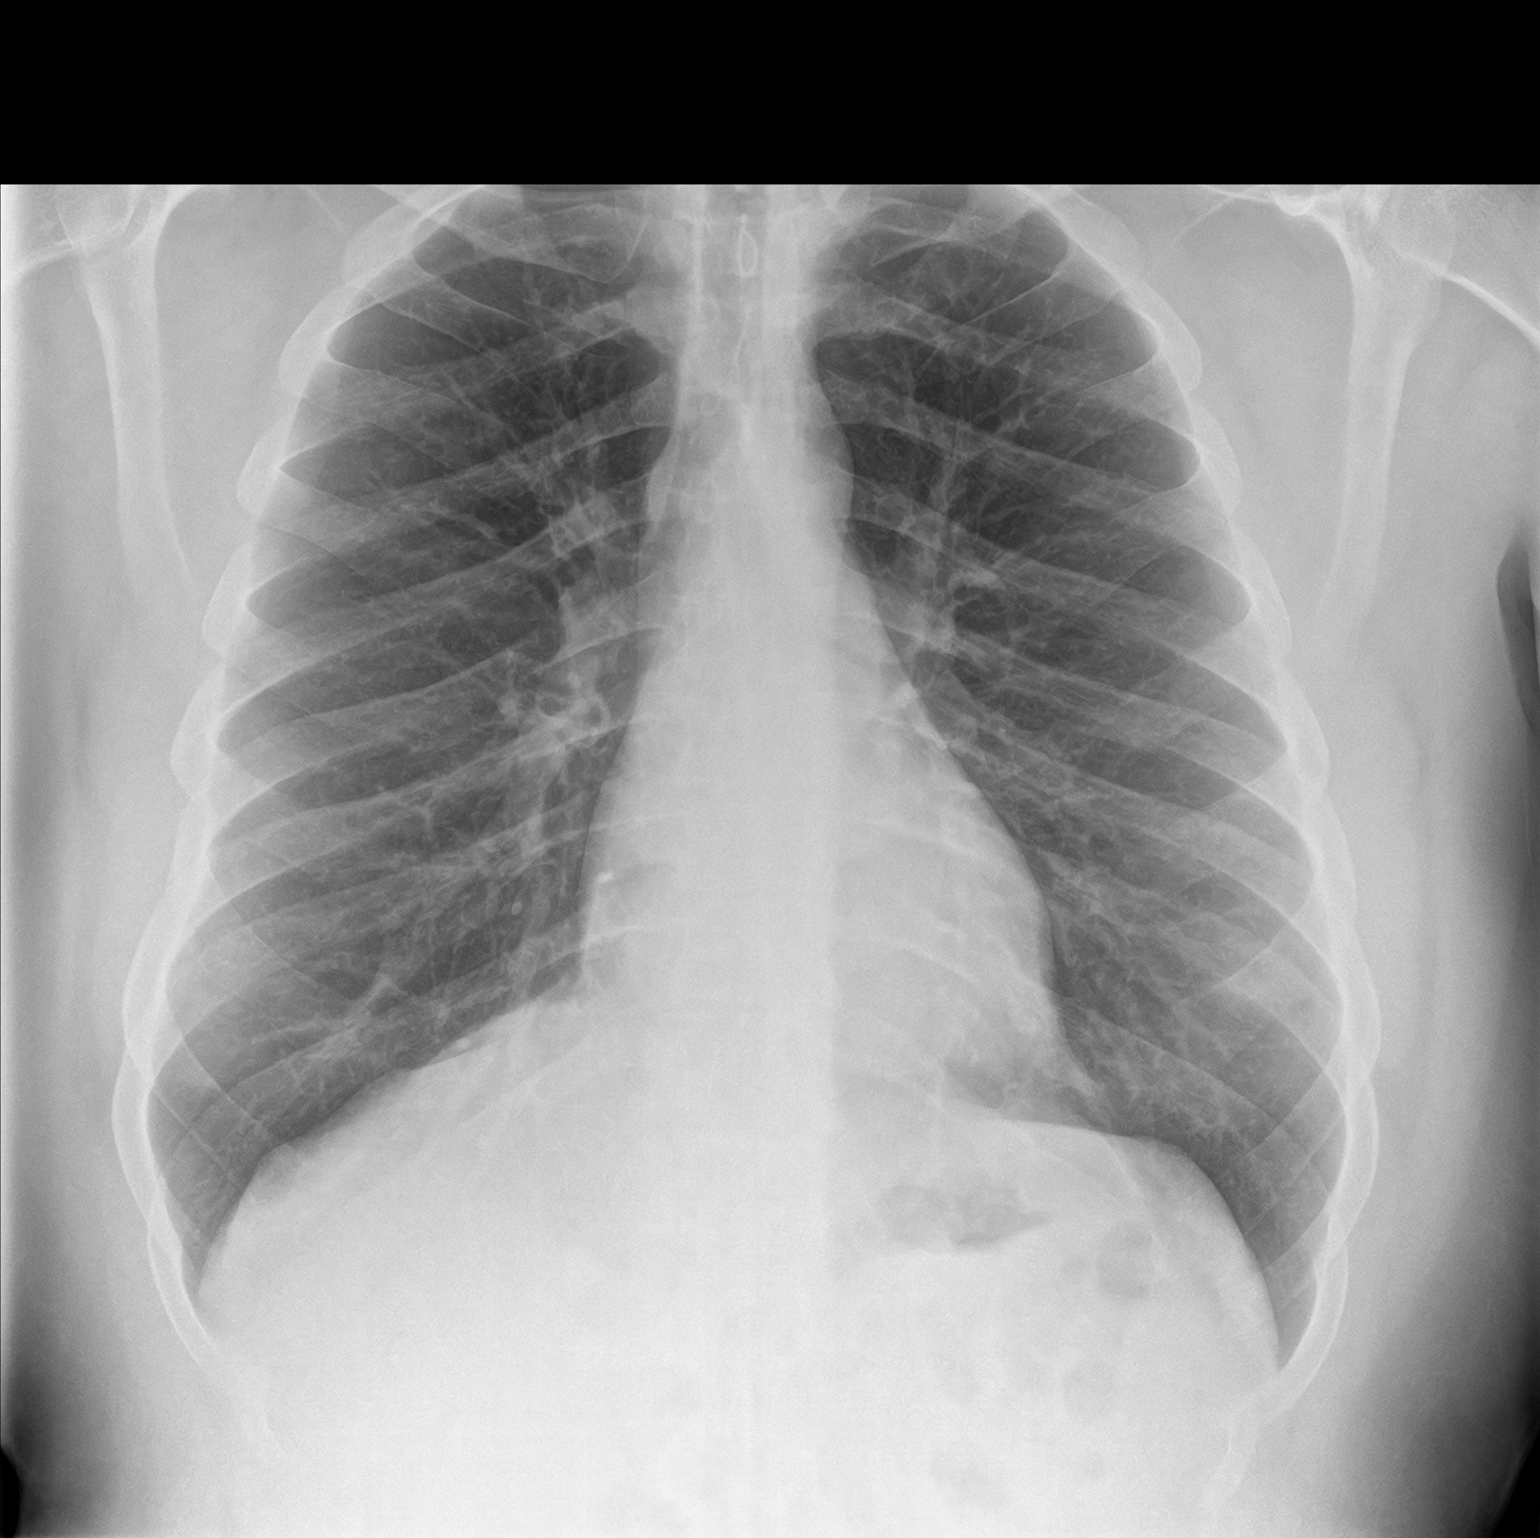
[im 2/2]
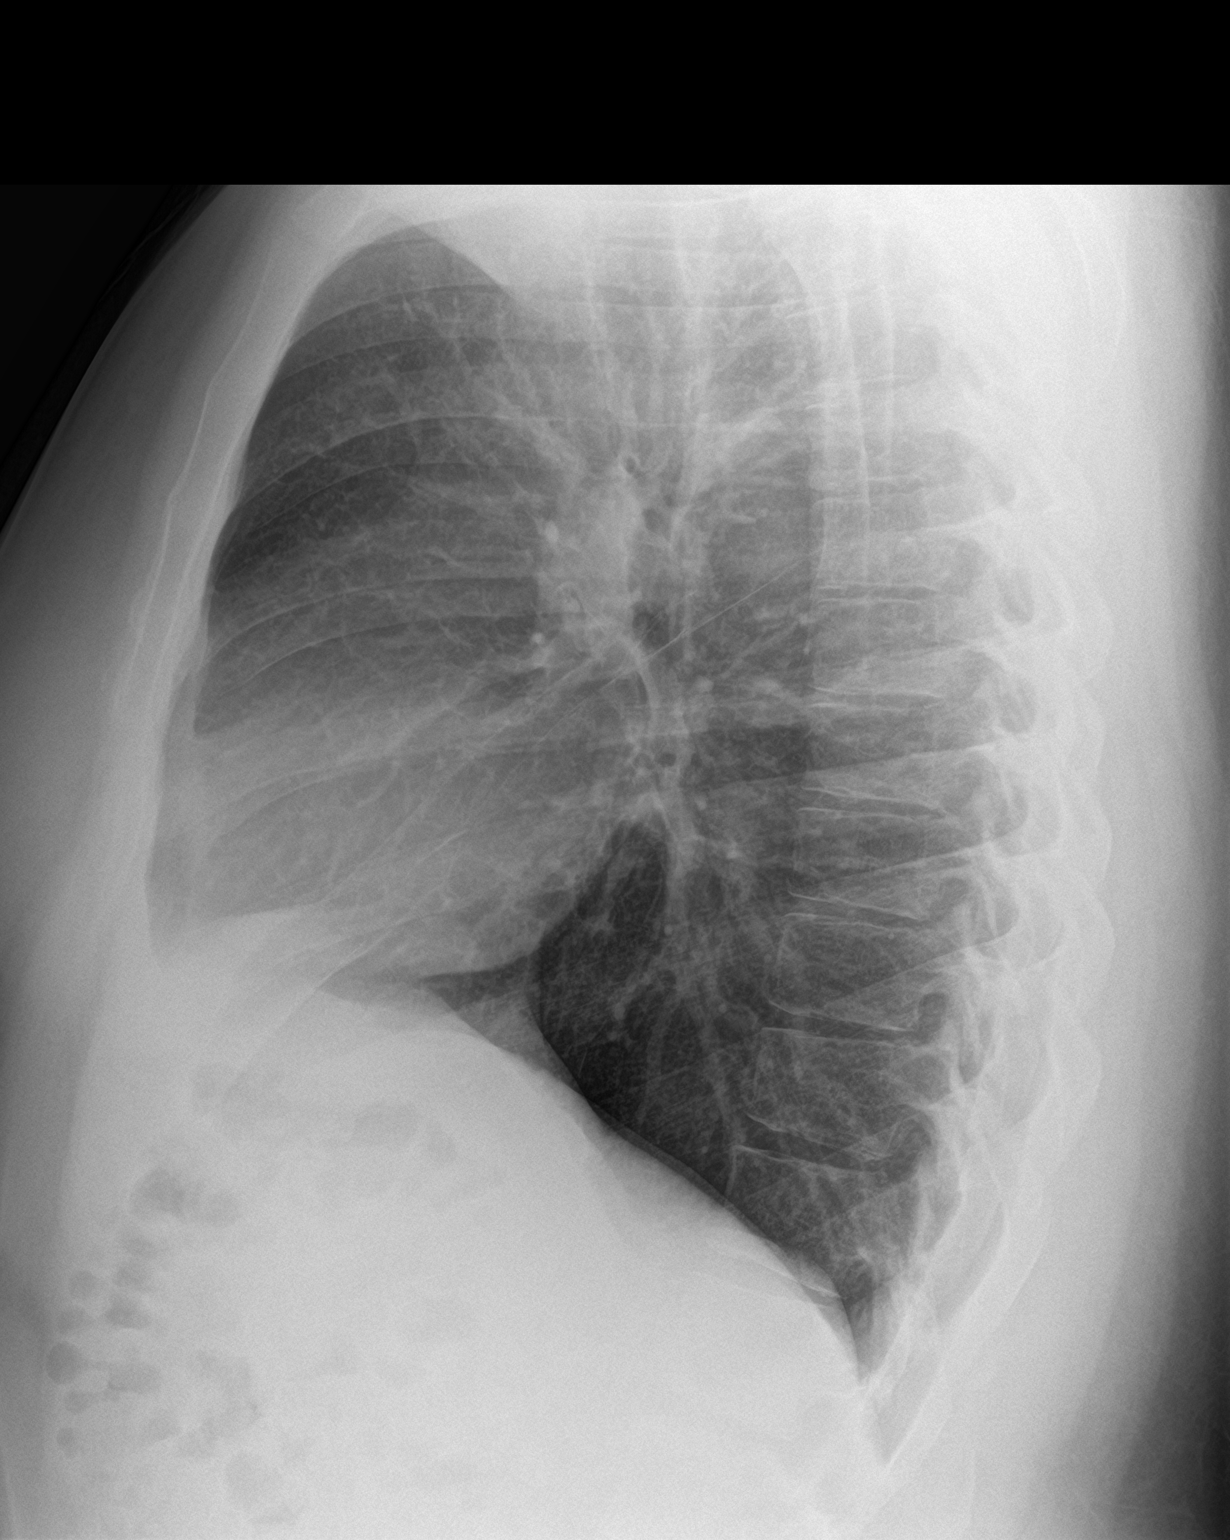

[2 of 2 positions shown; findings below may reference images not displayed]

FINDINGS: Lungs clear. Heart size and pulmonary vascularity are normal. No
adenopathy. No bone lesions.
IMPRESSION: Lungs clear.  Cardiac silhouette within normal limits.

## 2022-05-10 DIAGNOSIS — Z419 Encounter for procedure for purposes other than remedying health state, unspecified: Secondary | ICD-10-CM | POA: Diagnosis not present

## 2022-05-20 ENCOUNTER — Other Ambulatory Visit: Payer: Self-pay

## 2022-05-20 MED ORDER — ALBUTEROL SULFATE HFA 108 (90 BASE) MCG/ACT IN AERS
2.0000 | INHALATION_SPRAY | RESPIRATORY_TRACT | 2 refills | Status: DC | PRN
  Filled 2022-05-20: qty 18, 30d supply, fill #0
  Filled 2022-06-20: qty 18, 30d supply, fill #1
  Filled 2022-07-15: qty 18, 30d supply, fill #2

## 2022-06-10 DIAGNOSIS — Z419 Encounter for procedure for purposes other than remedying health state, unspecified: Secondary | ICD-10-CM | POA: Diagnosis not present

## 2022-06-17 ENCOUNTER — Other Ambulatory Visit: Payer: Self-pay

## 2022-06-17 ENCOUNTER — Emergency Department: Payer: Medicaid Other

## 2022-06-17 ENCOUNTER — Emergency Department
Admission: EM | Admit: 2022-06-17 | Discharge: 2022-06-17 | Disposition: A | Discharge status: Home / Self Care | Payer: Medicaid Other | Attending: Emergency Medicine | Admitting: Emergency Medicine

## 2022-06-17 DIAGNOSIS — M79672 Pain in left foot: Secondary | ICD-10-CM | POA: Diagnosis present

## 2022-06-17 DIAGNOSIS — I2510 Atherosclerotic heart disease of native coronary artery without angina pectoris: Secondary | ICD-10-CM | POA: Insufficient documentation

## 2022-06-17 DIAGNOSIS — M722 Plantar fascial fibromatosis: Secondary | ICD-10-CM | POA: Diagnosis not present

## 2022-06-17 LAB — CBC WITH DIFFERENTIAL/PLATELET
Abs Immature Granulocytes: 0.05 10*3/uL (ref 0.00–0.07)
Basophils Absolute: 0.1 10*3/uL (ref 0.0–0.1)
Basophils Relative: 1 %
Eosinophils Absolute: 0.4 10*3/uL (ref 0.0–0.5)
Eosinophils Relative: 4 %
HCT: 50.1 % (ref 39.0–52.0)
Hemoglobin: 16.7 g/dL (ref 13.0–17.0)
Immature Granulocytes: 1 %
Lymphocytes Relative: 16 %
Lymphs Abs: 1.5 10*3/uL (ref 0.7–4.0)
MCH: 30.8 pg (ref 26.0–34.0)
MCHC: 33.3 g/dL (ref 30.0–36.0)
MCV: 92.3 fL (ref 80.0–100.0)
Monocytes Absolute: 0.6 10*3/uL (ref 0.1–1.0)
Monocytes Relative: 7 %
Neutro Abs: 6.5 10*3/uL (ref 1.7–7.7)
Neutrophils Relative %: 71 %
Platelets: 282 10*3/uL (ref 150–400)
RBC: 5.43 MIL/uL (ref 4.22–5.81)
RDW: 13.2 % (ref 11.5–15.5)
WBC: 9.1 10*3/uL (ref 4.0–10.5)
nRBC: 0 % (ref 0.0–0.2)

## 2022-06-17 LAB — BASIC METABOLIC PANEL
Anion gap: 10 (ref 5–15)
BUN: 13 mg/dL (ref 6–20)
CO2: 21 mmol/L — ABNORMAL LOW (ref 22–32)
Calcium: 9.3 mg/dL (ref 8.9–10.3)
Chloride: 109 mmol/L (ref 98–111)
Creatinine, Ser: 1.15 mg/dL (ref 0.61–1.24)
GFR, Estimated: >60 mL/min (ref >60)
Glucose, Bld: 107 mg/dL — ABNORMAL HIGH (ref 70–99)
Potassium: 4.1 mmol/L (ref 3.5–5.1)
Sodium: 140 mmol/L (ref 135–145)

## 2022-06-17 LAB — URIC ACID: Uric Acid, Serum: 8.6 mg/dL (ref 3.7–8.6)

## 2022-06-17 MED ORDER — MELOXICAM 15 MG PO TABS
15.0000 mg | ORAL_TABLET | Freq: Every day | ORAL | 2 refills | Status: AC
  Filled 2022-06-17: qty 30, 30d supply, fill #0

## 2022-06-17 NOTE — ED Provider Notes (Signed)
[facility] Provider Note    Event Date/Time   First MD Initiated Contact with Patient 06/17/22 1624     (approximate)   History   Foot Pain   HPI  Shane Leach is a 37 y.o. male with history of seizures, PE, EtOH dependence CAD presents emergency department with left foot pain.  Patient states it is painful to bear weight.  Noticed it was, swollen earlier today.  Patient does drink a lot of alcohol is worried he may have gout.  He denies any fever or chills.      Physical Exam   Triage Vital Signs: ED Triage Vitals  Enc Vitals Group     BP 06/17/22 1209 (!) 140/119     Pulse Rate 06/17/22 1209 99     Resp 06/17/22 1209 16     Temp 06/17/22 1209 97.9 F (36.6 C)     Temp src --      SpO2 06/17/22 1209 100 %     Weight 06/17/22 1208 270 lb 1 oz (122.5 kg)     Height 06/17/22 1208 6' (1.829 m)     Head Circumference --      Peak Flow --      Pain Score 06/17/22 1208 7     Pain Loc --      Pain Edu? --      Excl. in [place]? --     Most recent vital signs: Vitals:   06/17/22 1209  BP: (!) 140/119  Pulse: 99  Resp: 16  Temp: 97.9 F (36.6 C)  SpO2: 100%     General: Awake, no distress.   CV:  Good peripheral perfusion. regular rate and  rhythm Resp:  Normal effort.  Abd:  No distention.   Other:  Left foot tender along the dorsum and the plantar surface, patient has difficulty bearing weight, neurovascular is intact, calf is nontender   ED Results / Procedures / Treatments   Labs (all labs ordered are listed, but only abnormal results are displayed) Labs Reviewed  BASIC METABOLIC PANEL - Abnormal; Notable for the following components:      Result Value   CO2 21 (*)    Glucose, Bld 107 (*)    All other components within normal limits  CBC WITH DIFFERENTIAL/PLATELET  URIC ACID     EKG     RADIOLOGY X-ray of the left foot    PROCEDURES:   Procedures   MEDICATIONS ORDERED IN ED: Medications - No data to  display   IMPRESSION / MDM / [place] / ED COURSE  I reviewed the triage vital signs and the nursing notes.                              Differential diagnosis includes, but is not limited to, plantar fasciitis, gout, cellulitis, sprain  Patient's presentation is most consistent with acute complicated illness / injury requiring diagnostic workup.   Did explain the findings to the patient.  He is labs are reassuring, x-ray was independently reviewed and interpreted by me as being negative for any acute abnormality.  He was placed in Ace wrap and given crutches.  Prescription for meloxicam.  Follow-up with orthopedics/podiatry if not improving 1 week.  Patient is in agreement treatment plan.  He was discharged stable condition.      FINAL CLINICAL IMPRESSION(S) / ED DIAGNOSES   Final diagnoses:  Plantar fasciitis  Rx / DC Orders   ED Discharge Orders          Ordered    meloxicam (MOBIC) 15 MG tablet  Daily        06/17/22 1632             Note:  This document was prepared using Dragon voice recognition software and may include unintentional dictation errors.    Versie Starks, PA-C 06/17/22 1637    Arta Silence, MD 06/18/22 1504

## 2022-06-17 NOTE — ED Triage Notes (Signed)
C/O left foot pain. Describes pain to posterior aspect mainly but some to anterior foot.  Denies injury.  States symptoms started yesterday morning.

## 2022-06-17 NOTE — ED Provider Triage Note (Signed)
Emergency Medicine Provider Triage Evaluation Note  Shane Leach , a 37 y.o. male  was evaluated in triage.  Pt complains of left foot pain.  Symptoms started yesterday.  Swollen painful to bear weight.  No known injury.  Review of Systems  Positive:  Negative:   Physical Exam  BP (!) 140/119   Pulse 99   Temp 97.9 F (36.6 C)   Resp 16   Ht 6' (1.829 m)   Wt 122.5 kg   SpO2 100%   BMI 36.63 kg/m  Gen:   Awake, no distress   Resp:  Normal effort  MSK:   Moves extremities without difficulty, left foot tender on the dorsum and plantar surface Other:    Medical Decision Making  Medically screening exam initiated at 12:10 PM.  Appropriate orders placed.  Shane Leach was informed that the remainder of the evaluation will be completed by another provider, this initial triage assessment does not replace that evaluation, and the importance of remaining in the ED until their evaluation is complete.     Versie Starks, PA-C 06/17/22 1210

## 2022-06-17 NOTE — Discharge Instructions (Signed)
Apply ice to the plantar surface of the foot, wear the Ace wrap for extra support, use crutches is much as needed, you may want to explore KT tape for extra support of the plantar tendon.  Follow-up with podiatry if not improving in 1 to 2 weeks.  Take meloxicam as prescribed.

## 2022-06-20 ENCOUNTER — Other Ambulatory Visit: Payer: Self-pay

## 2022-07-05 ENCOUNTER — Telehealth: Payer: Self-pay

## 2022-07-05 NOTE — Telephone Encounter (Signed)
Mychart msg sent. AS, CMA 

## 2022-07-11 DIAGNOSIS — Z419 Encounter for procedure for purposes other than remedying health state, unspecified: Secondary | ICD-10-CM | POA: Diagnosis not present

## 2022-08-07 ENCOUNTER — Other Ambulatory Visit: Payer: Self-pay | Admitting: Internal Medicine

## 2022-08-07 ENCOUNTER — Other Ambulatory Visit: Payer: Self-pay | Admitting: Emergency Medicine

## 2022-08-07 DIAGNOSIS — J45909 Unspecified asthma, uncomplicated: Secondary | ICD-10-CM

## 2022-08-08 ENCOUNTER — Other Ambulatory Visit: Payer: Self-pay | Admitting: Internal Medicine

## 2022-08-08 ENCOUNTER — Other Ambulatory Visit: Payer: Self-pay

## 2022-08-08 DIAGNOSIS — J45909 Unspecified asthma, uncomplicated: Secondary | ICD-10-CM

## 2022-08-09 ENCOUNTER — Other Ambulatory Visit: Payer: Self-pay

## 2022-08-09 MED FILL — Albuterol Sulfate Soln Nebu 0.083% (2.5 MG/3ML): RESPIRATORY_TRACT | 5 days supply | Qty: 75 | Fill #0 | Status: AC

## 2022-09-18 ENCOUNTER — Other Ambulatory Visit: Payer: Self-pay

## 2022-09-18 ENCOUNTER — Emergency Department: Payer: Medicaid Other

## 2022-09-18 ENCOUNTER — Emergency Department
Admission: EM | Admit: 2022-09-18 | Discharge: 2022-09-18 | Disposition: A | Discharge status: Home / Self Care | Payer: Medicaid Other | Attending: Student in an Organized Health Care Education/Training Program | Admitting: Student in an Organized Health Care Education/Training Program

## 2022-09-18 DIAGNOSIS — Z7901 Long term (current) use of anticoagulants: Secondary | ICD-10-CM | POA: Insufficient documentation

## 2022-09-18 DIAGNOSIS — F17200 Nicotine dependence, unspecified, uncomplicated: Secondary | ICD-10-CM | POA: Insufficient documentation

## 2022-09-18 DIAGNOSIS — R0602 Shortness of breath: Secondary | ICD-10-CM | POA: Diagnosis present

## 2022-09-18 DIAGNOSIS — J45901 Unspecified asthma with (acute) exacerbation: Secondary | ICD-10-CM | POA: Insufficient documentation

## 2022-09-18 LAB — BASIC METABOLIC PANEL
Anion gap: 6 (ref 5–15)
BUN: 13 mg/dL (ref 6–20)
CO2: 24 mmol/L (ref 22–32)
Calcium: 9.3 mg/dL (ref 8.9–10.3)
Chloride: 108 mmol/L (ref 98–111)
Creatinine, Ser: 1.12 mg/dL (ref 0.61–1.24)
GFR, Estimated: >60 mL/min (ref >60)
Glucose, Bld: 89 mg/dL (ref 70–99)
Potassium: 4.6 mmol/L (ref 3.5–5.1)
Sodium: 138 mmol/L (ref 135–145)

## 2022-09-18 LAB — CBC
HCT: 48.9 % (ref 39.0–52.0)
Hemoglobin: 16.3 g/dL (ref 13.0–17.0)
MCH: 30.5 pg (ref 26.0–34.0)
MCHC: 33.3 g/dL (ref 30.0–36.0)
MCV: 91.6 fL (ref 80.0–100.0)
Platelets: 272 10*3/uL (ref 150–400)
RBC: 5.34 MIL/uL (ref 4.22–5.81)
RDW: 12.9 % (ref 11.5–15.5)
WBC: 6.3 10*3/uL (ref 4.0–10.5)
nRBC: 0 % (ref 0.0–0.2)

## 2022-09-18 MED ORDER — PREDNISONE 20 MG PO TABS
40.0000 mg | ORAL_TABLET | Freq: Every day | ORAL | 0 refills | Status: AC
  Filled 2022-09-18: qty 10, 5d supply, fill #0

## 2022-09-18 MED ORDER — IPRATROPIUM-ALBUTEROL 0.5-2.5 (3) MG/3ML IN SOLN
3.0000 mL | Freq: Once | RESPIRATORY_TRACT | Status: AC
  Administered 2022-09-18: 3 mL via RESPIRATORY_TRACT
  Filled 2022-09-18: qty 3

## 2022-09-18 MED ORDER — ALBUTEROL SULFATE HFA 108 (90 BASE) MCG/ACT IN AERS
2.0000 | INHALATION_SPRAY | Freq: Four times a day (QID) | RESPIRATORY_TRACT | 2 refills | Status: DC | PRN
  Filled 2022-09-18: qty 18, 25d supply, fill #0
  Filled 2022-10-04 – 2022-10-24 (×3): qty 18, 25d supply, fill #1
  Filled 2022-11-22 (×2): qty 18, 25d supply, fill #2

## 2022-09-18 MED ORDER — PREDNISONE 20 MG PO TABS
60.0000 mg | ORAL_TABLET | Freq: Once | ORAL | Status: AC
  Administered 2022-09-18: 60 mg via ORAL
  Filled 2022-09-18: qty 3

## 2022-09-18 NOTE — ED Triage Notes (Signed)
Pt presents to ED with /co of SOB for the past 3 days, pt states HX of PE's and is currently taking Eliquis and states he has missed 2 doses.

## 2022-09-18 NOTE — ED Provider Notes (Signed)
[facility] Provider Note    Event Date/Time   First MD Initiated Contact with Patient 09/18/22 [phone]     (approximate)   History   Shortness of Breath   HPI  Shane Leach is a 37 y.o. male with a history of asthma bronchitis as well as PE on Eliquis presents to the ER for evaluation of chest tightness and shortness of breath over the past 3 days.  Did miss 2 doses of Eliquis.  Denies any chest pain no hemoptysis.  Does smoke.  Can feel himself wheezing.     Physical Exam   Triage Vital Signs: ED Triage Vitals  Enc Vitals Group     BP      Pulse      Resp      Temp      Temp src      SpO2      Weight      Height      Head Circumference      Peak Flow      Pain Score      Pain Loc      Pain Edu?      Excl. in [place]?     Most recent vital signs: Vitals:   09/18/22 0829 09/18/22 0905  BP: (!) 113/100 (!) 134/92  Pulse: 94 77  Resp: 20 13  Temp: 98.5 F (36.9 C)   SpO2: 99% 100%     Constitutional: Alert  Eyes: Conjunctivae are normal.  Head: Atraumatic. Nose: No congestion/rhinnorhea. Mouth/Throat: Mucous membranes are moist.   Neck: Painless ROM.  Cardiovascular:   Good peripheral circulation. Respiratory: Normal respiratory effort.  Diffuse expiratory wheeze throughout all lung fields.  No crackles or rhonchi. Gastrointestinal: Soft and nontender.  Musculoskeletal:  no deformity Neurologic:  MAE spontaneously. No gross focal neurologic deficits are appreciated.  Skin:  Skin is warm, dry and intact. No rash noted. Psychiatric: Mood and affect are normal. Speech and behavior are normal.    ED Results / Procedures / Treatments   Labs (all labs ordered are listed, but only abnormal results are displayed) Labs Reviewed  BASIC METABOLIC PANEL  CBC  URINALYSIS, ROUTINE W REFLEX MICROSCOPIC     EKG ED ECG REPORT I, Willy Eddy, the attending physician, personally viewed and interpreted this ECG.   Date:  09/18/2022  EKG Time: 8:36  Rate: 90  Rhythm: sinus  Axis: left  Intervals: normal qt  ST&T Change:  no stemi, no depression     RADIOLOGY Please see ED Course for my review and interpretation.  I personally reviewed all radiographic images ordered to evaluate for the above acute complaints and reviewed radiology reports and findings.  These findings were personally discussed with the patient.  Please see medical record for radiology report.    PROCEDURES:  Critical Care performed:  Procedures   MEDICATIONS ORDERED IN ED: Medications  ipratropium-albuterol (DUONEB) 0.5-2.5 (3) MG/3ML nebulizer solution 3 mL (3 mLs Nebulization Given 09/18/22 0857)  predniSONE (DELTASONE) tablet 60 mg (60 mg Oral Given 09/18/22 0857)  ipratropium-albuterol (DUONEB) 0.5-2.5 (3) MG/3ML nebulizer solution 3 mL (3 mLs Nebulization Given 09/18/22 0928)     IMPRESSION / MDM / ASSESSMENT AND PLAN / ED COURSE  I reviewed the triage vital signs and the nursing notes.                              Differential diagnosis includes, but  is not limited to, Asthma, copd, CHF, pna, ptx, malignancy, Pe, anemia  Patient presenting to the ER for evaluation of symptoms as described above.  Based on symptoms, risk factors and considered above differential, this presenting complaint could reflect a potentially life-threatening illness therefore the patient will be placed on continuous pulse oximetry and telemetry for monitoring.  Laboratory evaluation will be sent to evaluate for the above complaints.  Patient nontoxic on exam does have diffuse wheezing and his presentation is most consistent with asthma/bronchitis.  Have a low suspicion for PE as he is on Eliquis even having reportedly missed a dose or 2.  This seems most clinically consistent with his asthma.  Will give nebulizer treatment as well as steroid.    Clinical Course as of 09/18/22 1021  Wed Sep 18, 2022  1165 Patient with significant improvement after  breathing treatment. [PR]  1016 Chest x-ray my review and interpretation without evidence of infiltrate or consolidation. [PR]  1019 Patient remains well-appearing in no acute distress no hypoxia.  Significant improvement in wheezing on exam.  Does appear stable appropriate for outpatient follow-up. [PR]    Clinical Course User Index [PR] Willy Eddy, MD     FINAL CLINICAL IMPRESSION(S) / ED DIAGNOSES   Final diagnoses:  Exacerbation of asthma, unspecified asthma severity, unspecified whether persistent     Rx / DC Orders   ED Discharge Orders          Ordered    predniSONE (DELTASONE) 20 MG tablet  Daily        09/18/22 1020    albuterol (VENTOLIN HFA) 108 (90 Base) MCG/ACT inhaler  Every 6 hours PRN        09/18/22 1020             Note:  This document was prepared using Dragon voice recognition software and may include unintentional dictation errors.    Willy Eddy, MD 09/18/22 1021

## 2022-09-18 NOTE — ED Notes (Signed)
Pt endorsing some relief with breathing tx, requesting another. Provider notified.

## 2022-10-04 ENCOUNTER — Other Ambulatory Visit: Payer: Self-pay

## 2022-10-04 MED FILL — Albuterol Sulfate Soln Nebu 0.083% (2.5 MG/3ML): RESPIRATORY_TRACT | 5 days supply | Qty: 75 | Fill #1 | Status: AC

## 2022-10-18 ENCOUNTER — Other Ambulatory Visit: Payer: Self-pay

## 2022-10-24 ENCOUNTER — Other Ambulatory Visit: Payer: Self-pay

## 2022-10-24 MED FILL — Albuterol Sulfate Soln Nebu 0.083% (2.5 MG/3ML): RESPIRATORY_TRACT | 5 days supply | Qty: 75 | Fill #2 | Status: AC

## 2022-10-28 ENCOUNTER — Other Ambulatory Visit: Payer: Self-pay

## 2022-11-22 ENCOUNTER — Other Ambulatory Visit: Payer: Self-pay

## 2022-11-22 ENCOUNTER — Other Ambulatory Visit: Payer: Self-pay | Admitting: Internal Medicine

## 2022-11-22 DIAGNOSIS — J45909 Unspecified asthma, uncomplicated: Secondary | ICD-10-CM

## 2022-11-22 MED ORDER — ALBUTEROL SULFATE (2.5 MG/3ML) 0.083% IN NEBU
2.5000 mg | INHALATION_SOLUTION | RESPIRATORY_TRACT | 0 refills | Status: DC | PRN
  Filled 2022-11-22: qty 75, 5d supply, fill #0

## 2022-11-25 IMAGING — CR DG CHEST 2V
2 series · 2 of 2 positions shown · non-contrast
Comparison: Chest x-ray 03/22/2020.

CLINICAL DATA: Shortness of breath.  Hemoptysis.

EXAM:
CHEST - 2 VIEW

[chest pa]
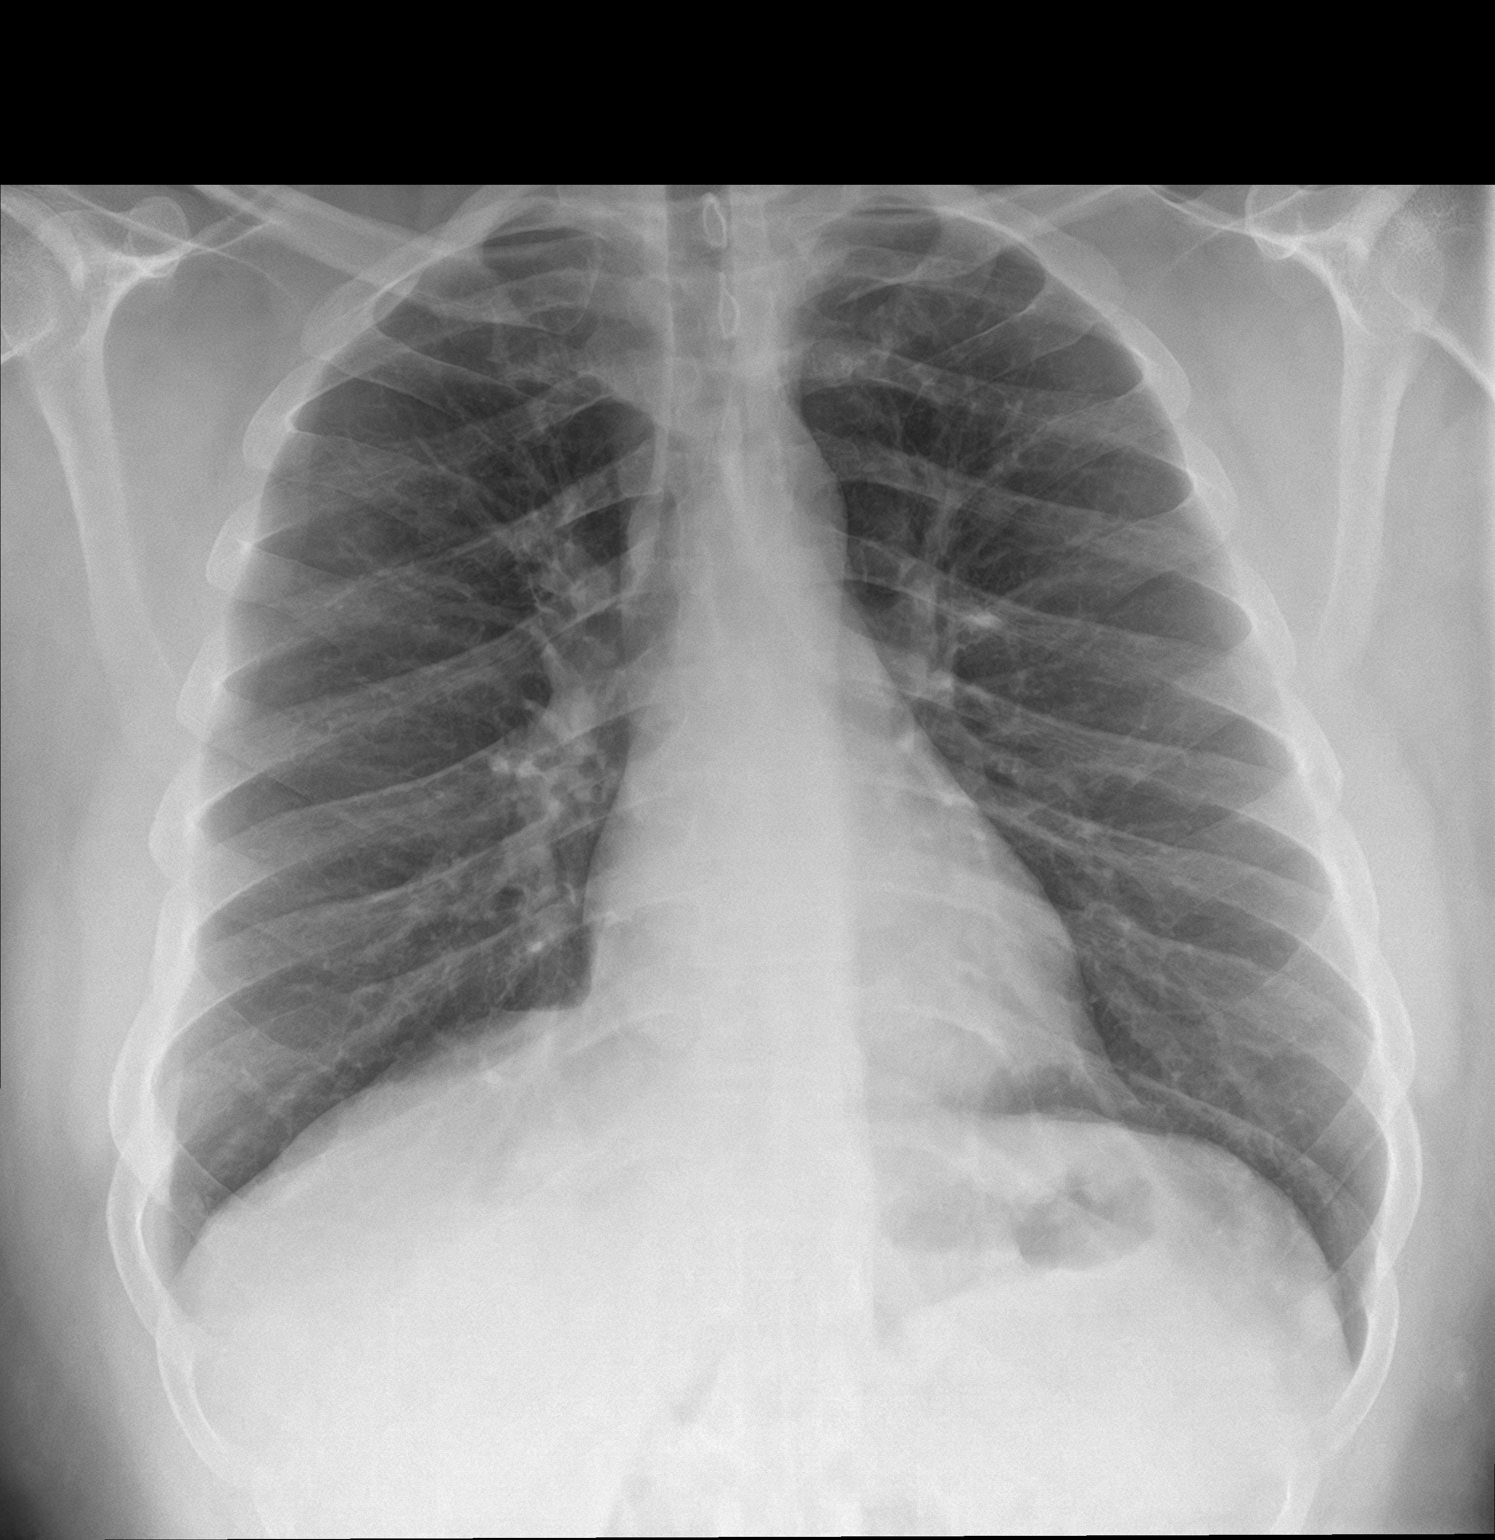

[chest lat]
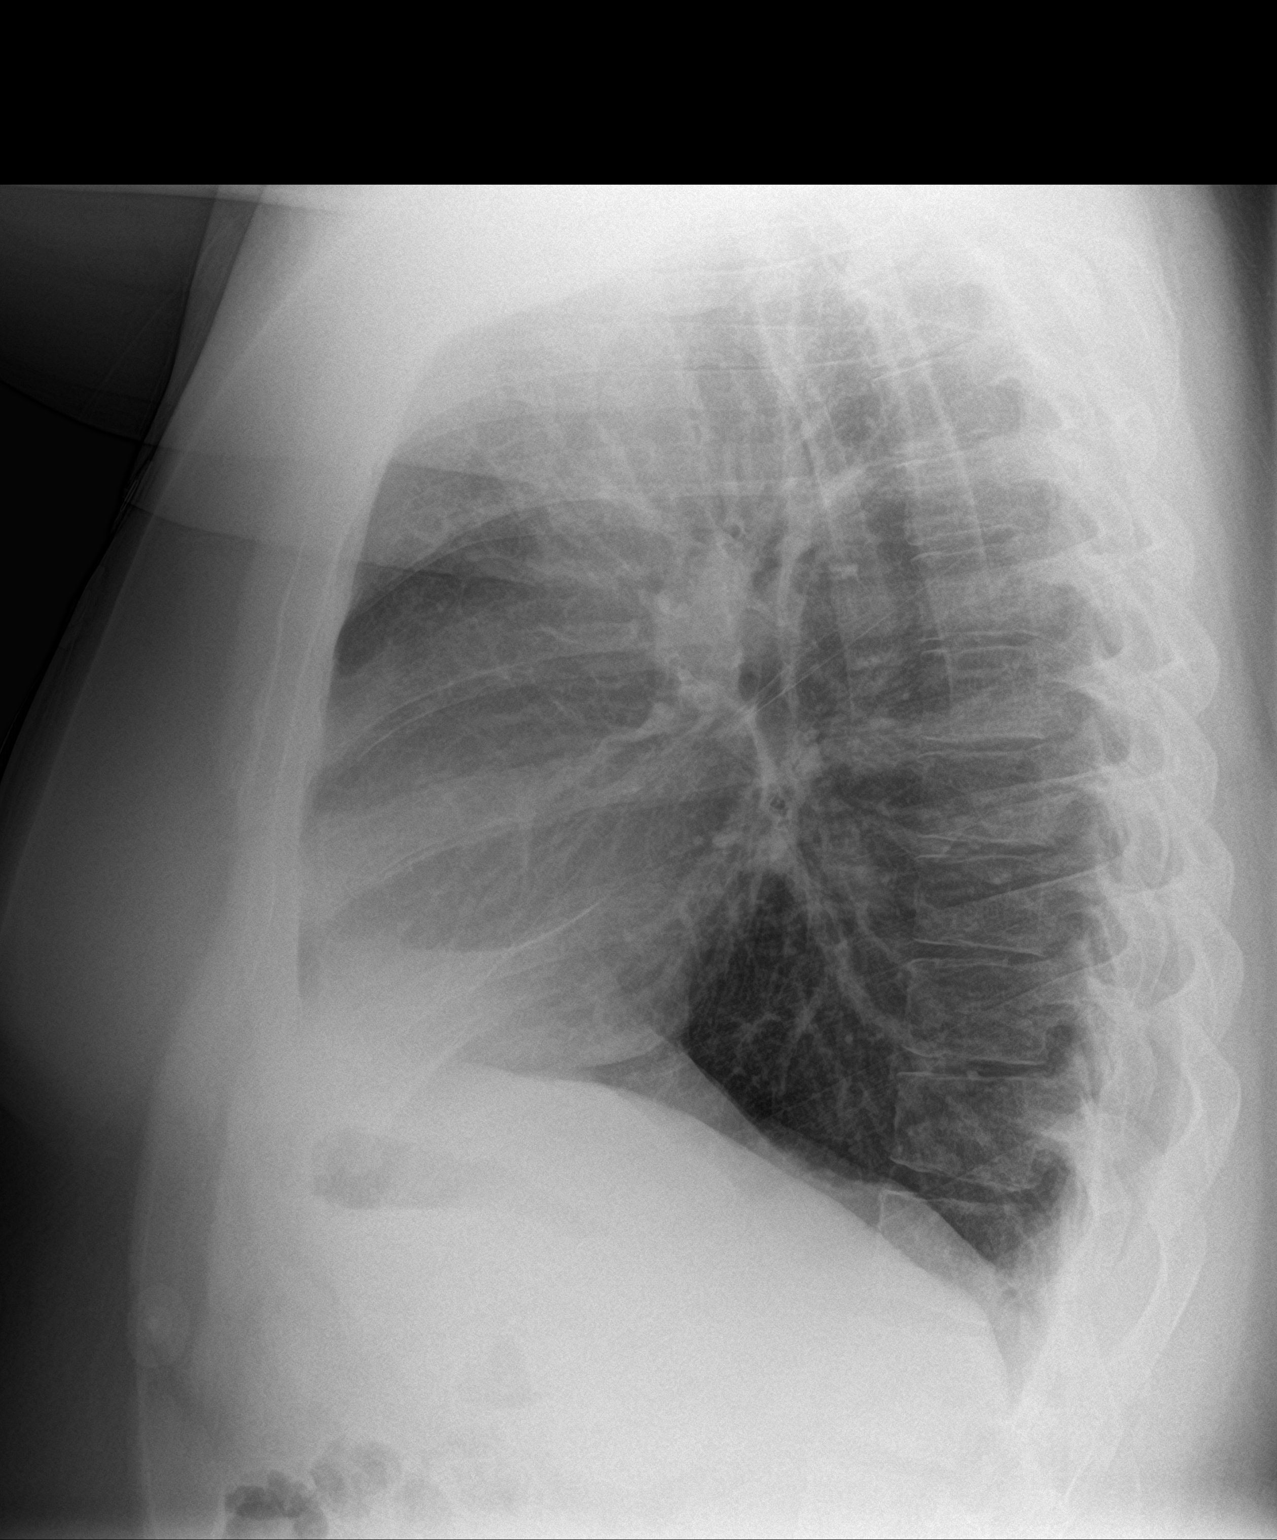

[2 of 2 positions shown; findings below may reference images not displayed]

FINDINGS: Mediastinum and hilar structures normal. Heart size normal. No focal
infiltrate. No pleural effusion or pneumothorax. No acute bony
abnormality.
IMPRESSION: No acute cardiopulmonary disease.

## 2022-11-25 IMAGING — US US EXTREM LOW VENOUS
1 series · 13 of 24 positions shown · non-contrast
Comparison: None.

CLINICAL DATA: Pulmonary emboli

EXAM:
BILATERAL LOWER EXTREMITY VENOUS DUPLEX ULTRASOUND
TECHNIQUE: Gray-scale sonography with graded compression, as well as color
Doppler and duplex ultrasound were performed to evaluate the lower
extremity deep venous systems from the level of the common femoral
vein and including the common femoral, femoral, profunda femoral,
popliteal and calf veins including the posterior tibial, peroneal
and gastrocnemius veins when visible. The superficial great
saphenous vein was also interrogated. Spectral Doppler was utilized
to evaluate flow at rest and with distal augmentation maneuvers in
the common femoral, femoral and popliteal veins.

[Series 1: us venous img lower bilat (dvt) · portal-venous · 13 of 55 slices shown]
[im 1/55]
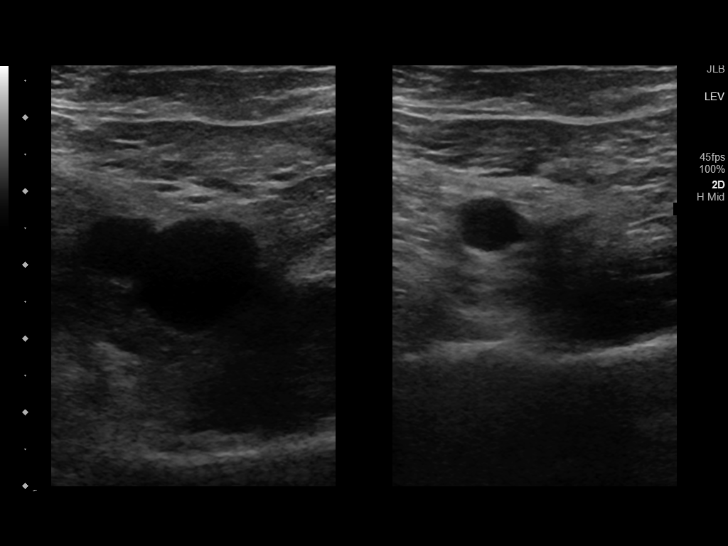
[im 5/55]
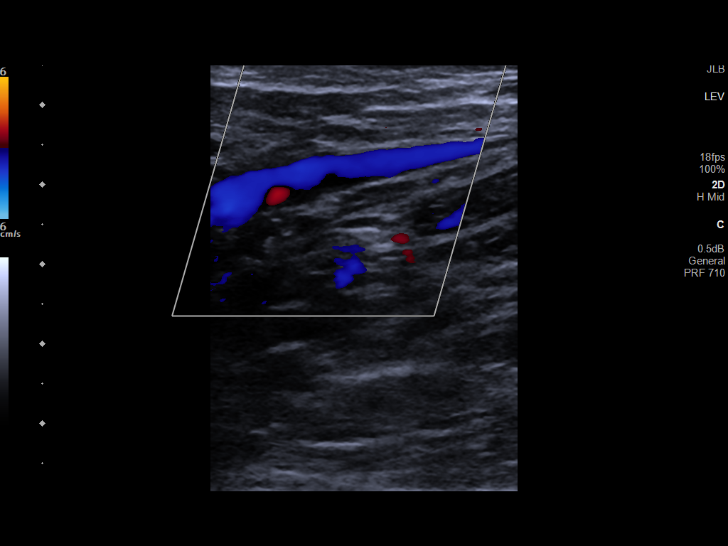
[im 10/55]
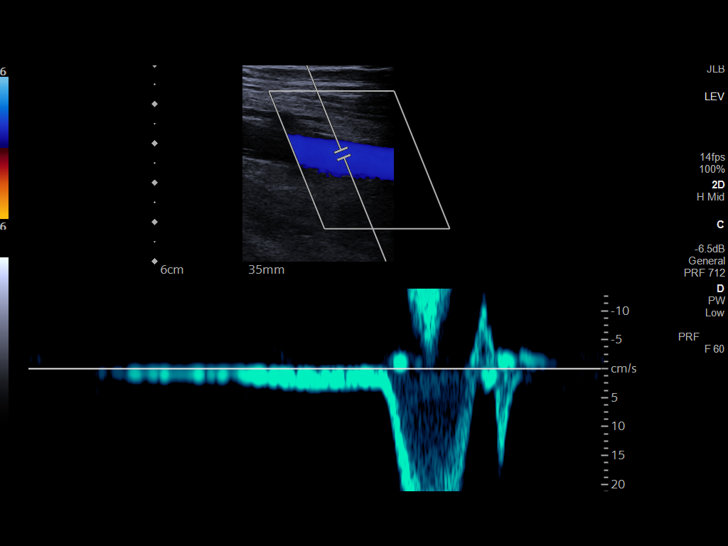
[im 15/55]
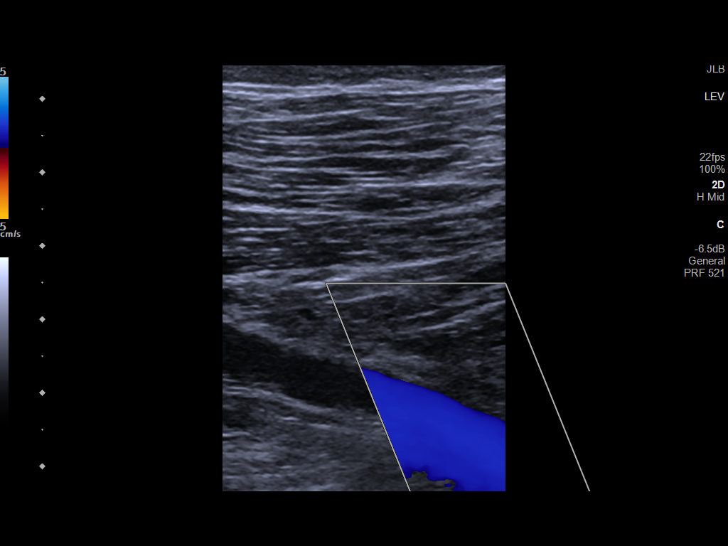
[im 19/55]
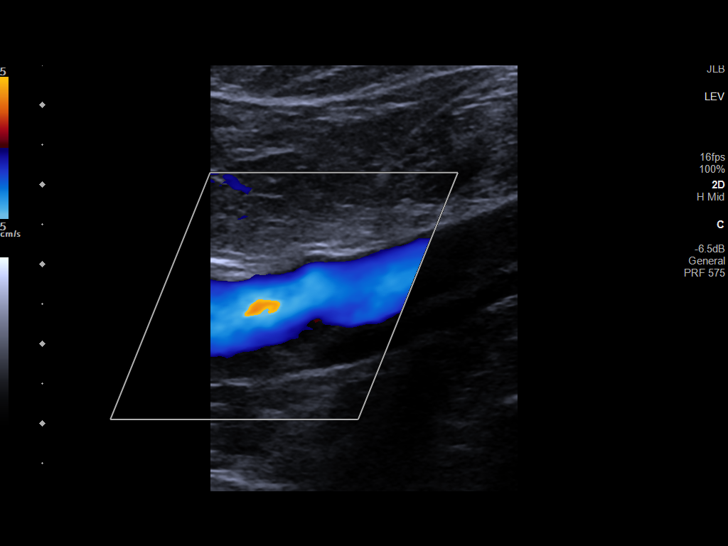
[im 24/55]
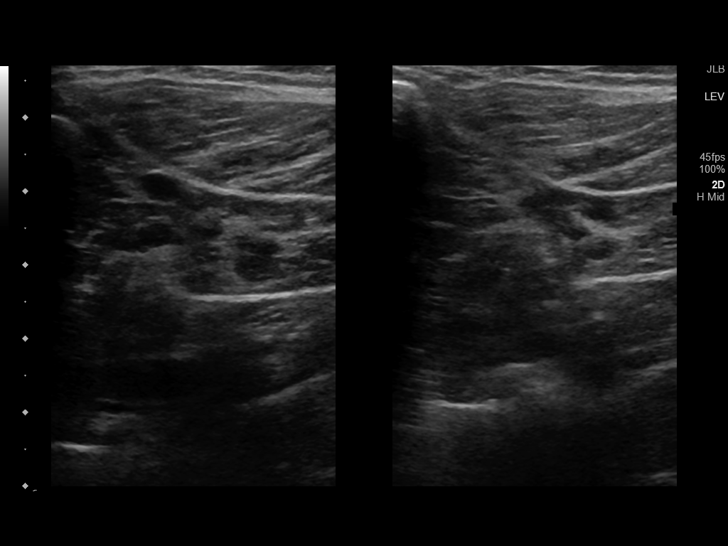
[im 29/55]
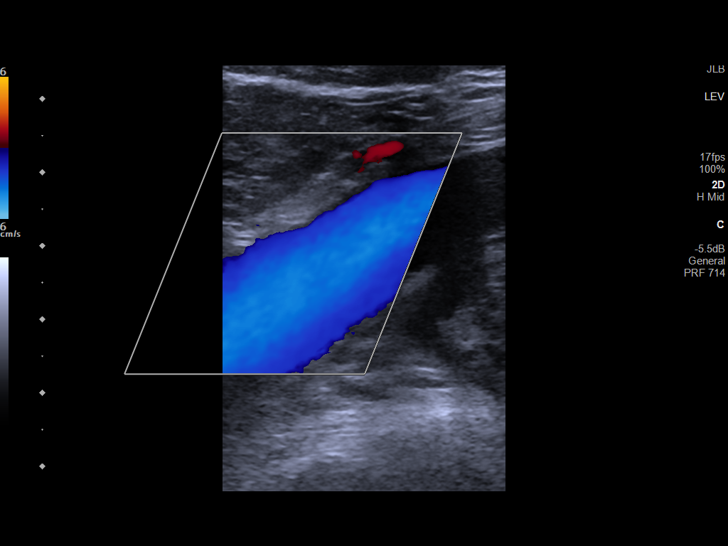
[im 31/55]
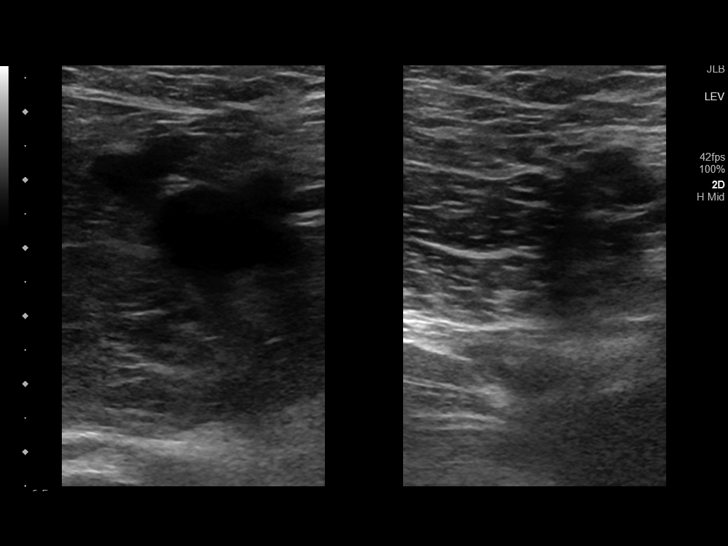
[im 36/55]
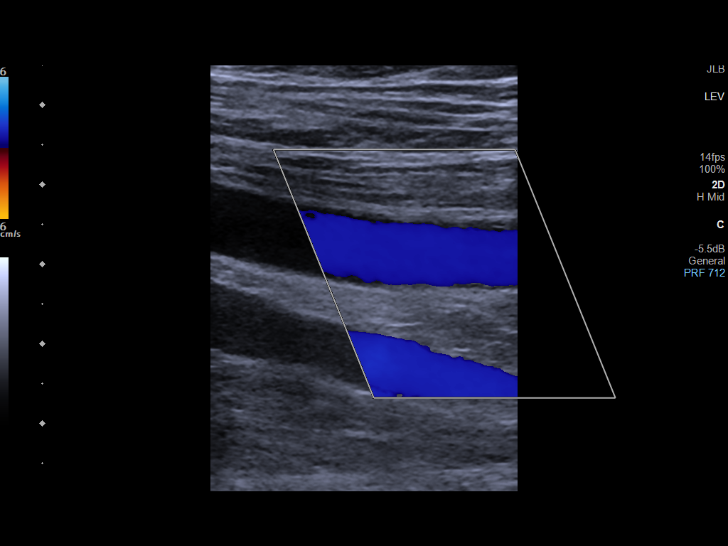
[im 40/55]
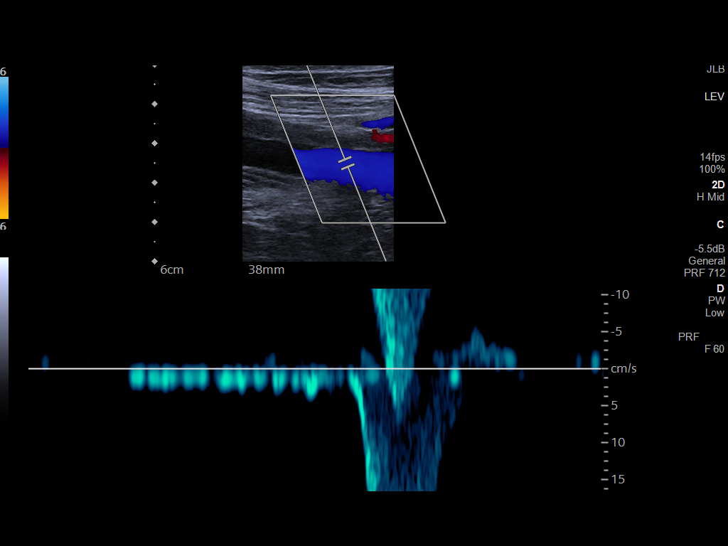
[im 45/55]
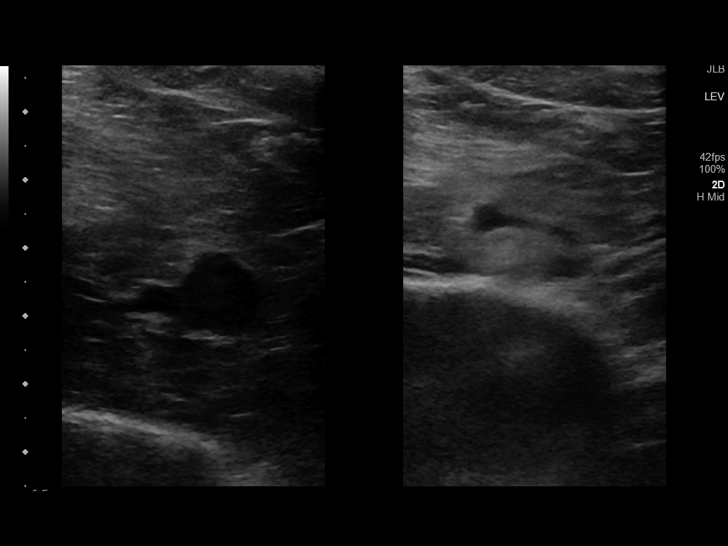
[im 50/55]
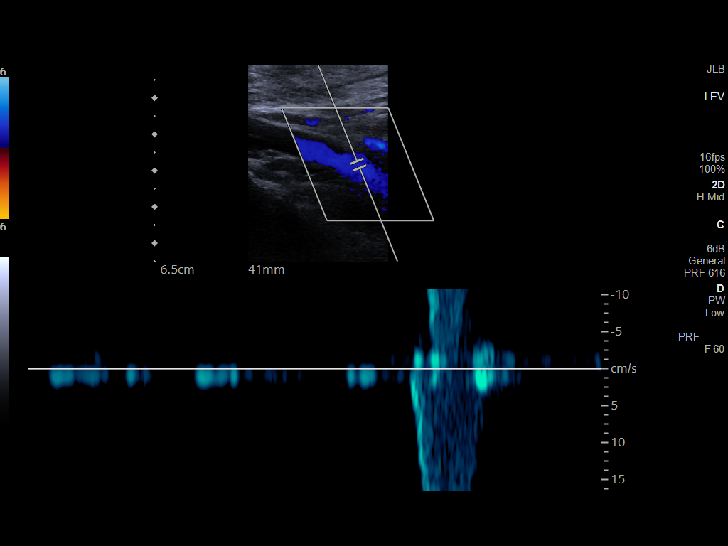
[im 55/55]
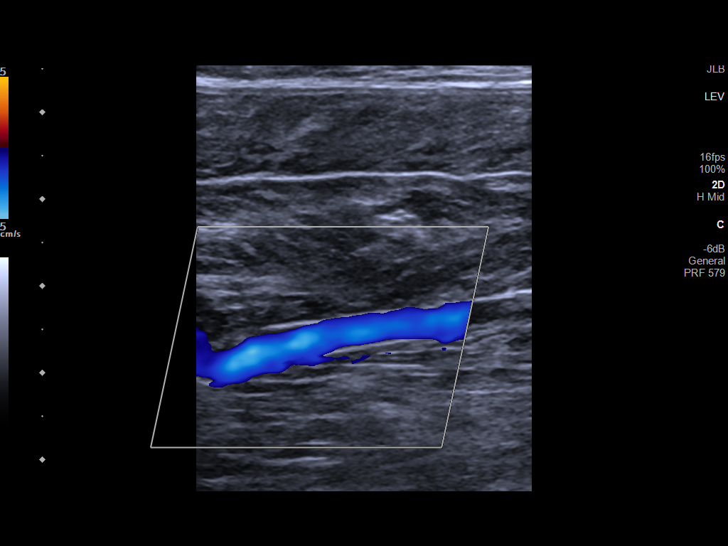

[13 of 24 positions shown; findings below may reference images not displayed]

FINDINGS: RIGHT LOWER EXTREMITY

Common Femoral Vein: No evidence of thrombus. Normal
compressibility, respiratory phasicity and response to augmentation.

Saphenofemoral Junction: No evidence of thrombus. Normal
compressibility and flow on color Doppler imaging.

Profunda Femoral Vein: No evidence of thrombus. Normal
compressibility and flow on color Doppler imaging.

Femoral Vein: No evidence of thrombus. Normal compressibility,
respiratory phasicity and response to augmentation.

Popliteal Vein: No evidence of thrombus. Normal compressibility,
respiratory phasicity and response to augmentation.

Calf Veins: No evidence of thrombus. Normal compressibility and flow
on color Doppler imaging.

Superficial Great Saphenous Vein: No evidence of thrombus. Normal
compressibility.

Venous Reflux:  None.

Other Findings:  None.

LEFT LOWER EXTREMITY

Common Femoral Vein: No evidence of thrombus. Normal
compressibility, respiratory phasicity and response to augmentation.

Saphenofemoral Junction: No evidence of thrombus. Normal
compressibility and flow on color Doppler imaging.

Profunda Femoral Vein: No evidence of thrombus. Normal
compressibility and flow on color Doppler imaging.

Femoral Vein: No evidence of thrombus. Normal compressibility,
respiratory phasicity and response to augmentation.

Popliteal Vein: No evidence of thrombus. Normal compressibility,
respiratory phasicity and response to augmentation.

Calf Veins: No evidence of thrombus. Normal compressibility and flow
on color Doppler imaging.

Superficial Great Saphenous Vein: No evidence of thrombus. Normal
compressibility.

Venous Reflux:  None.

Other Findings:  None.
IMPRESSION: No evidence of deep venous thrombosis in either lower extremity.

## 2022-12-24 ENCOUNTER — Other Ambulatory Visit: Payer: Self-pay | Admitting: Internal Medicine

## 2022-12-24 ENCOUNTER — Other Ambulatory Visit: Payer: Self-pay | Admitting: Student in an Organized Health Care Education/Training Program

## 2022-12-24 ENCOUNTER — Other Ambulatory Visit: Payer: Self-pay

## 2022-12-24 DIAGNOSIS — J45909 Unspecified asthma, uncomplicated: Secondary | ICD-10-CM

## 2022-12-25 ENCOUNTER — Other Ambulatory Visit: Payer: Self-pay

## 2023-01-01 NOTE — Telephone Encounter (Signed)
Pt is ineligible for the clinic due to having medicaid insurance.

## 2023-01-07 ENCOUNTER — Emergency Department
Admission: EM | Admit: 2023-01-07 | Discharge: 2023-01-07 | Disposition: A | Discharge status: Home / Self Care | Payer: Medicaid Other | Attending: Emergency Medicine | Admitting: Emergency Medicine

## 2023-01-07 ENCOUNTER — Other Ambulatory Visit: Payer: Self-pay

## 2023-01-07 ENCOUNTER — Encounter: Payer: Self-pay | Admitting: Emergency Medicine

## 2023-01-07 DIAGNOSIS — J4540 Moderate persistent asthma, uncomplicated: Secondary | ICD-10-CM | POA: Diagnosis not present

## 2023-01-07 DIAGNOSIS — Z7901 Long term (current) use of anticoagulants: Secondary | ICD-10-CM | POA: Insufficient documentation

## 2023-01-07 DIAGNOSIS — Z716 Tobacco abuse counseling: Secondary | ICD-10-CM | POA: Diagnosis not present

## 2023-01-07 DIAGNOSIS — J45909 Unspecified asthma, uncomplicated: Secondary | ICD-10-CM

## 2023-01-07 DIAGNOSIS — H109 Unspecified conjunctivitis: Secondary | ICD-10-CM | POA: Diagnosis present

## 2023-01-07 MED ORDER — BUDESONIDE-FORMOTEROL FUMARATE 160-4.5 MCG/ACT IN AERO
INHALATION_SPRAY | RESPIRATORY_TRACT | 11 refills | Status: AC
  Filled 2023-01-07 – 2023-01-17 (×2): qty 10.2, 30d supply, fill #0
  Filled 2023-04-04: qty 10.2, 30d supply, fill #1

## 2023-01-07 MED ORDER — TETRACAINE HCL 0.5 % OP SOLN
2.0000 [drp] | Freq: Once | OPHTHALMIC | Status: AC
  Administered 2023-01-07: 2 [drp] via OPHTHALMIC
  Filled 2023-01-07: qty 4

## 2023-01-07 MED ORDER — FLUORESCEIN SODIUM 1 MG OP STRP
1.0000 | ORAL_STRIP | Freq: Once | OPHTHALMIC | Status: AC
  Administered 2023-01-07: 1 via OPHTHALMIC
  Filled 2023-01-07: qty 1

## 2023-01-07 MED ORDER — PREDNISONE 50 MG PO TABS
ORAL_TABLET | ORAL | 0 refills | Status: DC
  Filled 2023-01-07: qty 5, 5d supply, fill #0

## 2023-01-07 MED ORDER — ALBUTEROL SULFATE (2.5 MG/3ML) 0.083% IN NEBU
2.5000 mg | INHALATION_SOLUTION | RESPIRATORY_TRACT | 2 refills | Status: DC | PRN
Start: 2023-01-07 — End: 2023-05-04
  Filled 2023-01-07: qty 75, 5d supply, fill #0
  Filled 2023-02-03: qty 75, 5d supply, fill #1
  Filled 2023-04-04 (×2): qty 75, 5d supply, fill #2

## 2023-01-07 MED ORDER — ALBUTEROL SULFATE HFA 108 (90 BASE) MCG/ACT IN AERS
2.0000 | INHALATION_SPRAY | Freq: Four times a day (QID) | RESPIRATORY_TRACT | 2 refills | Status: DC | PRN
  Filled 2023-01-07: qty 18, 25d supply, fill #0
  Filled 2023-04-04 – 2023-08-13 (×2): qty 18, 25d supply, fill #1

## 2023-01-07 NOTE — ED Provider Notes (Cosign Needed Addendum)
[facility] Provider Note    Event Date/Time   First MD Initiated Contact with Patient 01/07/23 1123     (approximate)   History   Conjunctivitis   HPI  Shane Leach is a 37 y.o. male with PMH of asthma, pulmonary embolism on Eliquis, seizures who presents for evaluation of possible conjunctivitis.  Patient states that his daughter was recently diagnosed and given eyedrops, he is concerned that he may have picked it up from her.  He reports right eye pain, swelling and foreign body sensation.  He would also like a refill on his albuterol inhaler.  He reports that he goes through it in about a week.     Physical Exam   Triage Vital Signs: ED Triage Vitals  Encounter Vitals Group     BP 01/07/23 1118 126/89     Systolic BP Percentile --      Diastolic BP Percentile --      Pulse Rate 01/07/23 1118 (!) 126     Resp 01/07/23 1118 18     Temp 01/07/23 1118 98.2 F (36.8 C)     Temp Source 01/07/23 1118 Oral     SpO2 01/07/23 1118 96 %     Weight 01/07/23 1117 240 lb (108.9 kg)     Height 01/07/23 1117 6' (1.829 m)     Head Circumference --      Peak Flow --      Pain Score 01/07/23 1117 0     Pain Loc --      Pain Education --      Exclude from Growth Chart --     Most recent vital signs: Vitals:   01/07/23 1118 01/07/23 1212  BP: 126/89 120/80  Pulse: (!) 126 (!) 108  Resp: 18 18  Temp: 98.2 F (36.8 C)   SpO2: 96% 96%    General: Awake, no distress.  CV:  Good peripheral perfusion.  RRR. Resp:  Normal effort.  Bilateral wheezing in all lung fields. Abd:  No distention.  Other:  PERRL.  EOM intact.  No discharge, no conjunctival injection.   ED Results / Procedures / Treatments   Labs (all labs ordered are listed, but only abnormal results are displayed) Labs Reviewed - No data to display    PROCEDURES:  Critical Care performed: No  Procedures   MEDICATIONS ORDERED IN ED: Medications  fluorescein ophthalmic  strip 1 strip (1 strip Right Eye Given by Other 01/07/23 1153)  tetracaine (PONTOCAINE) 0.5 % ophthalmic solution 2 drop (2 drops Right Eye Given by Other 01/07/23 1153)     IMPRESSION / MDM / ASSESSMENT AND PLAN / ED COURSE  I reviewed the triage vital signs and the nursing notes.                             37 year old male presents for evaluation of right eye pain, VSS in triage aside from an elevated heart rate.  Patient NAD on exam.  Differential diagnosis includes, but is not limited to, asthma exacerbation, allergies, viral conjunctivitis, bacterial conjunctivitis.  Patient's presentation is most consistent with acute, uncomplicated illness.  Patient is not presenting with any signs of conjunctivitis.  Fluorescein dye exam completed which did not show a corneal abrasion.  I explained to the patient that since he does not have any discharge coming from his eye he is not contagious and is okay to go back to work.  Patient has bilateral wheezing in all lung fields, but no signs of respiratory distress.  He reports that he goes through his albuterol inhaler in about a week as he uses it 4-5 times a day.  I explained that this is to be used as a rescue inhaler and he needs another daily steroid inhaler to help manage his symptoms.  He does not currently have a PCP.  I sent an urgent referral.  I will also renew his prescriptions for his albuterol inhaler, albuterol nebulizer and Symbicort. I also sent a prescription for a 5 day course of steroids. He reports that he has been taking his eliquis for his previous PE, I emphasized the importance of taking this medication consistently. He was counseled on smoking cessation. I advised him to set up an appointment soon as possible with his PCP for asthma management.  He is aware that he can return to the ED with any new or worsening symptoms. Patient voiced understanding and was stable at discharge.      FINAL CLINICAL IMPRESSION(S) / ED DIAGNOSES    Final diagnoses:  Encounter for smoking cessation counseling  Moderate persistent asthma, unspecified whether complicated     Rx / DC Orders   ED Discharge Orders          Ordered    albuterol (VENTOLIN HFA) 108 (90 Base) MCG/ACT inhaler  Every 6 hours PRN        01/07/23 1146    budesonide-formoterol (SYMBICORT) 160-4.5 MCG/ACT inhaler        01/07/23 1146    Ambulatory Referral to Primary Care (Establish Care)  Status:  Canceled       Comments: Needs asthma management   01/07/23 1146    Ambulatory Referral to Primary Care (Establish Care)        01/07/23 1218    albuterol (PROVENTIL) (2.5 MG/3ML) 0.083% nebulizer solution  Every 4 hours PRN       Note to Pharmacy: box size 75ml Patient need's a f/u in our office to get more reill's   01/07/23 1220    predniSONE (DELTASONE) 50 MG tablet        01/07/23 1220             Note:  This document was prepared using Dragon voice recognition software and may include unintentional dictation errors.   Cameron Ali, PA-C 01/07/23 1233    Cameron Ali, PA-C 01/07/23 1234    Corena Herter, MD 01/08/23 [phone]

## 2023-01-07 NOTE — ED Provider Notes (Signed)
Shared visit  Patient with a history of pulmonary embolism on Eliquis, asthma, tobacco use, who presents to the emergency department with symptoms of concern for conjunctivitis.  States that while he was here he also wanted to be seen for his ongoing wheezing and does not currently have a primary care physician.  Ongoing tobacco use but states that he is encouraged to quit smoking and has cut back.  Does state that he has been compliant with his Eliquis.  No recent worsening shortness of his breath.  No chest pain.  Does endorse a mild cough that has been nonproductive.  No fever or chills.  Initially tachycardic in the emergency department, tachycardia significantly improved.  Does endorse using albuterol multiple times a day.  Do not feel that repeat CTA or PE workup is necessary at this time given no new worsening shortness of breath, wheezing on exam, endorses compliance with his Eliquis.  Will do a course of steroids.  Encouraged and discussed at length smoking cessation and needing to follow-up with a primary care physician and reestablish with his pulmonologist.  Will refill his albuterol inhaler, nebulizers and long-acting steroids.  Refilled Eliquis.  Given a referral to primary care provider.  Given return precautions for any worsening symptoms.   Corena Herter, MD 01/07/23 1302

## 2023-01-07 NOTE — ED Triage Notes (Signed)
Patient to ED via POV for right eye pink- daughter dx with pink eye yesterday. Also wanting albuterol inhaler refilled. NAD noted

## 2023-01-07 NOTE — Discharge Instructions (Signed)
I have sent refills for your albuterol inhaler, albuterol nebulizer and Symbicort.  Please take these medications as prescribed.  I also sent you 5 days of steroids, this will help improve your wheezing.  Continue to take the Eliquis as prescribed.  I put in an urgent referral for a primary care provider for you.  Please schedule an appointment with your primary care as soon as you are able.  Please return to the ED with any new or worsening symptoms.

## 2023-01-08 ENCOUNTER — Other Ambulatory Visit: Payer: Self-pay

## 2023-01-17 ENCOUNTER — Other Ambulatory Visit: Payer: Self-pay

## 2023-01-30 ENCOUNTER — Other Ambulatory Visit: Payer: Self-pay

## 2023-02-03 ENCOUNTER — Other Ambulatory Visit: Payer: Self-pay

## 2023-04-04 ENCOUNTER — Other Ambulatory Visit: Payer: Self-pay

## 2023-04-07 ENCOUNTER — Other Ambulatory Visit: Payer: Self-pay

## 2023-05-04 ENCOUNTER — Emergency Department
Admission: EM | Admit: 2023-05-04 | Discharge: 2023-05-04 | Disposition: A | Discharge status: Home / Self Care | Payer: Self-pay | Attending: Student in an Organized Health Care Education/Training Program | Admitting: Student in an Organized Health Care Education/Training Program

## 2023-05-04 ENCOUNTER — Other Ambulatory Visit: Payer: Self-pay

## 2023-05-04 ENCOUNTER — Emergency Department: Payer: Self-pay

## 2023-05-04 DIAGNOSIS — Z1152 Encounter for screening for COVID-19: Secondary | ICD-10-CM | POA: Insufficient documentation

## 2023-05-04 DIAGNOSIS — J45909 Unspecified asthma, uncomplicated: Secondary | ICD-10-CM

## 2023-05-04 DIAGNOSIS — J40 Bronchitis, not specified as acute or chronic: Secondary | ICD-10-CM

## 2023-05-04 DIAGNOSIS — J45901 Unspecified asthma with (acute) exacerbation: Secondary | ICD-10-CM | POA: Insufficient documentation

## 2023-05-04 LAB — CBC
HCT: 48.6 % (ref 39.0–52.0)
Hemoglobin: 16.4 g/dL (ref 13.0–17.0)
MCH: 30.5 pg (ref 26.0–34.0)
MCHC: 33.7 g/dL (ref 30.0–36.0)
MCV: 90.3 fL (ref 80.0–100.0)
Platelets: 256 10*3/uL (ref 150–400)
RBC: 5.38 MIL/uL (ref 4.22–5.81)
RDW: 13.2 % (ref 11.5–15.5)
WBC: 7.8 10*3/uL (ref 4.0–10.5)
nRBC: 0 % (ref 0.0–0.2)

## 2023-05-04 LAB — RESP PANEL BY RT-PCR (RSV, FLU A&B, COVID)  RVPGX2
Influenza A by PCR: NEGATIVE
Influenza B by PCR: NEGATIVE
Resp Syncytial Virus by PCR: NEGATIVE
SARS Coronavirus 2 by RT PCR: NEGATIVE

## 2023-05-04 LAB — BASIC METABOLIC PANEL
Anion gap: 11 (ref 5–15)
BUN: 9 mg/dL (ref 6–20)
CO2: 24 mmol/L (ref 22–32)
Calcium: 9.4 mg/dL (ref 8.9–10.3)
Chloride: 103 mmol/L (ref 98–111)
Creatinine, Ser: 1.38 mg/dL — ABNORMAL HIGH (ref 0.61–1.24)
GFR, Estimated: >60 mL/min (ref >60)
Glucose, Bld: 102 mg/dL — ABNORMAL HIGH (ref 70–99)
Potassium: 4.6 mmol/L (ref 3.5–5.1)
Sodium: 138 mmol/L (ref 135–145)

## 2023-05-04 LAB — TROPONIN I (HIGH SENSITIVITY): Troponin I (High Sensitivity): 8 ng/L (ref <18)

## 2023-05-04 LAB — D-DIMER, QUANTITATIVE: D-Dimer, Quant: 0.34 ug{FEU}/mL (ref 0.00–0.50)

## 2023-05-04 MED ORDER — PREDNISONE 20 MG PO TABS: 40.0000 mg | ORAL_TABLET | Freq: Every day | ORAL | 0 refills | Status: AC

## 2023-05-04 MED ORDER — IPRATROPIUM-ALBUTEROL 0.5-2.5 (3) MG/3ML IN SOLN
3.0000 mL | Freq: Once | RESPIRATORY_TRACT | Status: AC
  Administered 2023-05-04: 3 mL via RESPIRATORY_TRACT
  Filled 2023-05-04: qty 3

## 2023-05-04 MED ORDER — SODIUM CHLORIDE 0.9 % IV BOLUS
500.0000 mL | Freq: Once | INTRAVENOUS | Status: AC
  Administered 2023-05-04: 500 mL via INTRAVENOUS

## 2023-05-04 MED ORDER — AZITHROMYCIN 250 MG PO TABS: ORAL_TABLET | ORAL | 0 refills | Status: AC

## 2023-05-04 MED ORDER — ALBUTEROL SULFATE (2.5 MG/3ML) 0.083% IN NEBU
2.5000 mg | INHALATION_SOLUTION | RESPIRATORY_TRACT | 2 refills | Status: DC | PRN
Start: 2023-05-04 — End: 2024-02-25

## 2023-05-04 MED ORDER — METHYLPREDNISOLONE SODIUM SUCC 125 MG IJ SOLR
125.0000 mg | Freq: Once | INTRAMUSCULAR | Status: AC
  Administered 2023-05-04: 125 mg via INTRAVENOUS
  Filled 2023-05-04: qty 2

## 2023-05-04 NOTE — ED Provider Notes (Signed)
[facility] Provider Note    Event Date/Time   First MD Initiated Contact with Patient 05/04/23 [phone]     (approximate)   History   Chest Pain, Shortness of Breath, and Cough   HPI  Shane Leach is a 37 y.o. male history of asthma bronchitis remote PE not currently on anticoagulation presents to the ER for evaluation of cough tight chest wheezing for the past few days.  States it has been intermittently productive has been having some nasal congestion.  No lower extremity swelling.  Denies any chest pain or pressure.  Does not feel like his PE feels like his bronchitis.     Physical Exam   Triage Vital Signs: ED Triage Vitals  Encounter Vitals Group     BP 05/04/23 0753 (!) 160/121     Systolic BP Percentile --      Diastolic BP Percentile --      Pulse Rate 05/04/23 0753 (!) 136     Resp 05/04/23 0753 20     Temp 05/04/23 0753 97.9 F (36.6 C)     Temp Source 05/04/23 0753 Oral     SpO2 05/04/23 0753 94 %     Weight 05/04/23 0750 242 lb 8.1 oz (110 kg)     Height 05/04/23 0750 5\' 10"  (1.778 m)     Head Circumference --      Peak Flow --      Pain Score 05/04/23 0750 8     Pain Loc --      Pain Education --      Exclude from Growth Chart --     Most recent vital signs: Vitals:   05/04/23 0900 05/04/23 0930  BP: 131/78 108/69  Pulse: (!) 118 (!) 118  Resp: 15 (!) 25  Temp:    SpO2: 100% 98%     Constitutional: Alert  Eyes: Conjunctivae are normal.  Head: Atraumatic. Nose: + congestion/rhinnorhea. Mouth/Throat: Mucous membranes are moist.   Neck: Painless ROM.  Cardiovascular:   Good peripheral circulation. Respiratory: Diffuse musical expiratory wheeze.  No crackles. Gastrointestinal: Soft and nontender.  Musculoskeletal:  no deformity Neurologic:  MAE spontaneously. No gross focal neurologic deficits are appreciated.  Skin:  Skin is warm, dry and intact. No rash noted. Psychiatric: Mood and affect are normal. Speech and  behavior are normal.    ED Results / Procedures / Treatments   Labs (all labs ordered are listed, but only abnormal results are displayed) Labs Reviewed  BASIC METABOLIC PANEL - Abnormal; Notable for the following components:      Result Value   Glucose, Bld 102 (*)    Creatinine, Ser 1.38 (*)    All other components within normal limits  RESP PANEL BY RT-PCR (RSV, FLU A&B, COVID)  RVPGX2  CBC  D-DIMER, QUANTITATIVE  TROPONIN I (HIGH SENSITIVITY)     EKG  ED ECG REPORT I, Willy Eddy, the attending physician, personally viewed and interpreted this ECG.   Date: 05/04/2023  EKG Time: 8:01  Rate: 130  Rhythm: sinus  Axis: right  Intervals: nml  ST&T Change: no stemi, no depressions    RADIOLOGY Please see ED Course for my review and interpretation.  I personally reviewed all radiographic images ordered to evaluate for the above acute complaints and reviewed radiology reports and findings.  These findings were personally discussed with the patient.  Please see medical record for radiology report.    PROCEDURES:  Critical Care performed: No  Procedures  MEDICATIONS ORDERED IN ED: Medications  ipratropium-albuterol (DUONEB) 0.5-2.5 (3) MG/3ML nebulizer solution 3 mL (has no administration in time range)  ipratropium-albuterol (DUONEB) 0.5-2.5 (3) MG/3ML nebulizer solution 3 mL (3 mLs Nebulization Given 05/04/23 0824)  methylPREDNISolone sodium succinate (SOLU-MEDROL) 125 mg/2 mL injection 125 mg (125 mg Intravenous Given 05/04/23 0845)  ipratropium-albuterol (DUONEB) 0.5-2.5 (3) MG/3ML nebulizer solution 3 mL (3 mLs Nebulization Given 05/04/23 0949)  sodium chloride 0.9 % bolus 500 mL (500 mLs Intravenous New Bag/Given 05/04/23 0950)     IMPRESSION / MDM / ASSESSMENT AND PLAN / ED COURSE  I reviewed the triage vital signs and the nursing notes.                              Differential diagnosis includes, but is not limited to, Asthma, copd, CHF, pna,  ptx, malignancy, Pe, anemia  Patient presenting to the ER for evaluation of symptoms as described above.  Based on symptoms, risk factors and considered above differential, this presenting complaint could reflect a potentially life-threatening illness therefore the patient will be placed on continuous pulse oximetry and telemetry for monitoring.  Laboratory evaluation will be sent to evaluate for the above complaints.  Chest x-ray on my review and interpretation without evidence of edema.  This presentation seems most clinically consistent with acute asthma exacerbation and bronchitis.  Low suspicion for PE.    Clinical Course as of 05/04/23 1019  Sun May 04, 2023  1610 D-dimer is negative. Trop neg.  Most consistent with asthma/bronchitis [PR]  1017 Patient reassessed with significant improvement in symptoms.  States he does feel okay for discharge home but would like 1 more breathing treatment before he goes. [PR]    Clinical Course User Index [PR] Willy Eddy, MD     FINAL CLINICAL IMPRESSION(S) / ED DIAGNOSES   Final diagnoses:  Bronchitis  Mild asthma with acute exacerbation, unspecified whether persistent     Rx / DC Orders   ED Discharge Orders          Ordered    predniSONE (DELTASONE) 20 MG tablet  Daily        05/04/23 1018    azithromycin (ZITHROMAX Z-PAK) 250 MG tablet        05/04/23 1018    albuterol (PROVENTIL) (2.5 MG/3ML) 0.083% nebulizer solution  Every 4 hours PRN       Note to Pharmacy: box size 75ml Patient need's a f/u in our office to get more reill's   05/04/23 1018             Note:  This document was prepared using Dragon voice recognition software and may include unintentional dictation errors.    Willy Eddy, MD 05/04/23 1019

## 2023-05-04 NOTE — ED Triage Notes (Signed)
Pt reports CP, SOB, and coughing a lot. Pt reports sx's started yesterday. Pt denies fevers but reports chills. Pt reports cough is productive and chest hurts worse when he coughs. Pt also reports some nasal congestion.

## 2023-08-13 ENCOUNTER — Other Ambulatory Visit: Payer: Self-pay

## 2023-08-14 ENCOUNTER — Other Ambulatory Visit: Payer: Self-pay

## 2023-09-02 ENCOUNTER — Other Ambulatory Visit: Payer: Self-pay

## 2023-09-02 ENCOUNTER — Encounter: Payer: Self-pay | Admitting: Emergency Medicine

## 2023-09-02 ENCOUNTER — Emergency Department: Payer: Self-pay

## 2023-09-02 ENCOUNTER — Emergency Department
Admission: EM | Admit: 2023-09-02 | Discharge: 2023-09-02 | Disposition: A | Discharge status: Home / Self Care | Payer: Self-pay | Attending: Emergency Medicine | Admitting: Emergency Medicine

## 2023-09-02 DIAGNOSIS — J45901 Unspecified asthma with (acute) exacerbation: Secondary | ICD-10-CM | POA: Insufficient documentation

## 2023-09-02 LAB — BASIC METABOLIC PANEL
Anion gap: 8 (ref 5–15)
BUN: 15 mg/dL (ref 6–20)
CO2: 24 mmol/L (ref 22–32)
Calcium: 9.6 mg/dL (ref 8.9–10.3)
Chloride: 107 mmol/L (ref 98–111)
Creatinine, Ser: 1.14 mg/dL (ref 0.61–1.24)
GFR, Estimated: >60 mL/min (ref >60)
Glucose, Bld: 101 mg/dL — ABNORMAL HIGH (ref 70–99)
Potassium: 4.3 mmol/L (ref 3.5–5.1)
Sodium: 139 mmol/L (ref 135–145)

## 2023-09-02 LAB — CBC WITH DIFFERENTIAL/PLATELET
Abs Immature Granulocytes: 0.03 10*3/uL (ref 0.00–0.07)
Basophils Absolute: 0.1 10*3/uL (ref 0.0–0.1)
Basophils Relative: 1 %
Eosinophils Absolute: 0.3 10*3/uL (ref 0.0–0.5)
Eosinophils Relative: 4 %
HCT: 49.3 % (ref 39.0–52.0)
Hemoglobin: 16.8 g/dL (ref 13.0–17.0)
Immature Granulocytes: 0 %
Lymphocytes Relative: 28 %
Lymphs Abs: 2 10*3/uL (ref 0.7–4.0)
MCH: 30.9 pg (ref 26.0–34.0)
MCHC: 34.1 g/dL (ref 30.0–36.0)
MCV: 90.8 fL (ref 80.0–100.0)
Monocytes Absolute: 0.6 10*3/uL (ref 0.1–1.0)
Monocytes Relative: 8 %
Neutro Abs: 4.2 10*3/uL (ref 1.7–7.7)
Neutrophils Relative %: 59 %
Platelets: 311 10*3/uL (ref 150–400)
RBC: 5.43 MIL/uL (ref 4.22–5.81)
RDW: 13.2 % (ref 11.5–15.5)
WBC: 7.1 10*3/uL (ref 4.0–10.5)
nRBC: 0 % (ref 0.0–0.2)

## 2023-09-02 LAB — D-DIMER, QUANTITATIVE: D-Dimer, Quant: 0.28 ug{FEU}/mL (ref 0.00–0.50)

## 2023-09-02 LAB — SARS CORONAVIRUS 2 BY RT PCR: SARS Coronavirus 2 by RT PCR: NEGATIVE

## 2023-09-02 LAB — TROPONIN I (HIGH SENSITIVITY): Troponin I (High Sensitivity): 4 ng/L (ref <18)

## 2023-09-02 MED ORDER — IPRATROPIUM-ALBUTEROL 0.5-2.5 (3) MG/3ML IN SOLN
6.0000 mL | Freq: Once | RESPIRATORY_TRACT | Status: AC
  Administered 2023-09-02: 6 mL via RESPIRATORY_TRACT
  Filled 2023-09-02: qty 6

## 2023-09-02 MED ORDER — ALBUTEROL SULFATE (2.5 MG/3ML) 0.083% IN NEBU: 2.5000 mg | INHALATION_SOLUTION | RESPIRATORY_TRACT | 1 refills | Status: DC | PRN

## 2023-09-02 MED ORDER — BUDESONIDE 0.25 MG/2ML IN SUSP: 0.2500 mg | Freq: Every day | RESPIRATORY_TRACT | 1 refills | Status: DC

## 2023-09-02 MED ORDER — PREDNISONE 20 MG PO TABS
60.0000 mg | ORAL_TABLET | Freq: Once | ORAL | Status: AC
  Administered 2023-09-02: 60 mg via ORAL
  Filled 2023-09-02: qty 3

## 2023-09-02 MED ORDER — PREDNISONE 20 MG PO TABS: 40.0000 mg | ORAL_TABLET | Freq: Every day | ORAL | 0 refills | Status: AC

## 2023-09-02 MED ORDER — ALBUTEROL SULFATE HFA 108 (90 BASE) MCG/ACT IN AERS
2.0000 | INHALATION_SPRAY | RESPIRATORY_TRACT | Status: DC | PRN
  Filled 2023-09-02: qty 6.7

## 2023-09-02 NOTE — Discharge Instructions (Signed)
 You were seen in the ER for your shortness of breath.  I suspect this is likely related to an asthma flare.  I have sent a short steroid course, refill for your albuterol nebulizer solution, as well as a daily controlling inhaler to your pharmacy.  Please use these as directed.  Please arrange follow-up with your primary care doctor for long-term management of your asthma.  Return to the ER for new or worsening symptoms.

## 2023-09-02 NOTE — ED Triage Notes (Signed)
 Pt to ED via POV. Pt states that he has been having increased shortness of breath over the last week. ShOB has gotten worse in the past few days. Pt has hx/o of asthma and PE. Pt states that PE was about 3 years ago and he is not longer on thinners. Pt reports that he is also having cough and tightness in his chest and back. Pt is currently in NAD and is able to speak in complete sentences.

## 2023-09-02 NOTE — ED Notes (Signed)
 Pt in X ray

## 2023-09-02 NOTE — ED Provider Notes (Signed)
 [facility] Provider Note    Event Date/Time   First MD Initiated Contact with Patient 09/02/23 [phone]     (approximate)   History   Shortness of Breath   HPI  NICOLES SEDLACEK is a 38 year old male with history of asthma, PE no longer on anticoagulation presenting to the emergency department for evaluation of shortness of breath.  Reports over the last week he has had progressive shortness of breath.  Reports that about a month ago he ran out of his nebulizer solution, and has been using his albuterol inhaler about 3 times a day.  No fevers or chills.  Has had a cough.  Does report discomfort described as a tightness in his chest and back.  Has a history of a PE, but reports this feels more similar to his asthma.    Physical Exam   Triage Vital Signs: ED Triage Vitals  Encounter Vitals Group     BP 09/02/23 0753 (!) 144/107     Systolic BP Percentile --      Diastolic BP Percentile --      Pulse Rate 09/02/23 0753 (!) 108     Resp 09/02/23 0753 18     Temp 09/02/23 0753 98.5 F (36.9 C)     Temp Source 09/02/23 0753 Oral     SpO2 09/02/23 0753 98 %     Weight 09/02/23 0754 240 lb (108.9 kg)     Height 09/02/23 0754 5\' 10"  (1.778 m)     Head Circumference --      Peak Flow --      Pain Score 09/02/23 0754 7     Pain Loc --      Pain Education --      Exclude from Growth Chart --     Most recent vital signs: Vitals:   09/02/23 0753  BP: (!) 144/107  Pulse: (!) 108  Resp: 18  Temp: 98.5 F (36.9 C)  SpO2: 98%     General: Awake, interactive  CV:  Mild tachycardia, good peripheral perfusion Resp:  Unlabored respirations, diffuse expiratory wheezing without significant respiratory distress Abd:  Nondistended Neuro:  Symmetric facial movement, fluid speech   ED Results / Procedures / Treatments   Labs (all labs ordered are listed, but only abnormal results are displayed) Labs Reviewed  BASIC METABOLIC PANEL - Abnormal; Notable for  the following components:      Result Value   Glucose, Bld 101 (*)    All other components within normal limits  SARS CORONAVIRUS 2 BY RT PCR  CBC WITH DIFFERENTIAL/PLATELET  D-DIMER, QUANTITATIVE  TROPONIN I (HIGH SENSITIVITY)     EKG EKG independently reviewed interpreted by myself (ER attending) demonstrates:  EKG demonstrates sinus tachycardia rate of 101, PR 138, QRS 92, QTc 409, no acute ST changes  RADIOLOGY Imaging independently reviewed and interpreted by myself demonstrates:  CXR without focal consolidation   PROCEDURES:  Critical Care performed: No  Procedures   MEDICATIONS ORDERED IN ED: Medications  albuterol (VENTOLIN HFA) 108 (90 Base) MCG/ACT inhaler 2 puff (has no administration in time range)  ipratropium-albuterol (DUONEB) 0.5-2.5 (3) MG/3ML nebulizer solution 6 mL (6 mLs Nebulization Given 09/02/23 0840)  predniSONE (DELTASONE) tablet 60 mg (60 mg Oral Given 09/02/23 0840)     IMPRESSION / MDM / ASSESSMENT AND PLAN / ED COURSE  I reviewed the triage vital signs and the nursing notes.  Differential diagnosis includes, but is not limited to, asthma flare, pneumonia, viral  illness, lower suspicion but for consideration for PE  Patient's presentation is most consistent with acute presentation with potential threat to life or bodily function.  38 year old male presenting with shortness of breath.  Mild tachycardia on presentation.  Presentation seems most consistent with asthma flare.  Has history of PE, but wheezing on exam, subjectively feels more like his asthma flare.  Discussed D-dimer versus CTA imaging with patient, will start with D-dimer.  Will treat with steroids, nebulizer treatment.  Labs reassuring including normal white blood cell count, negative D-dimer, negative troponin and well over 3 hours of symptoms, low risk ACS.  CXR without focal consolidation.   Clinical Course as of 09/02/23 0955  Tue Sep 02, 2023  0934 Patient reassessed.  Feels  much improved.  Has rare expiratory wheeze, but overall improved lung exam.  He is comfortable with discharge home.  Discussed the importance of establishing care with a primary care doctor.  Will DC with refills for his albuterol nebulizer, steroid course, and inhaled corticosteroid. [NR]    Clinical Course User Index [NR] Trinna Post, MD     FINAL CLINICAL IMPRESSION(S) / ED DIAGNOSES   Final diagnoses:  Exacerbation of asthma, unspecified asthma severity, unspecified whether persistent     Rx / DC Orders   ED Discharge Orders          Ordered    albuterol (PROVENTIL) (2.5 MG/3ML) 0.083% nebulizer solution  Every 4 hours PRN        09/02/23 0954    budesonide (PULMICORT) 0.25 MG/2ML nebulizer solution  Daily        09/02/23 0954    predniSONE (DELTASONE) 20 MG tablet  Daily with breakfast        09/02/23 0954             Note:  This document was prepared using Dragon voice recognition software and may include unintentional dictation errors.   Trinna Post, MD 09/02/23 [phone]

## 2023-10-29 ENCOUNTER — Emergency Department
Admission: EM | Admit: 2023-10-29 | Discharge: 2023-10-29 | Disposition: A | Discharge status: Home / Self Care | Payer: Self-pay | Attending: Emergency Medicine | Admitting: Emergency Medicine

## 2023-10-29 ENCOUNTER — Emergency Department: Payer: Self-pay

## 2023-10-29 ENCOUNTER — Encounter: Payer: Self-pay | Admitting: Emergency Medicine

## 2023-10-29 ENCOUNTER — Other Ambulatory Visit: Payer: Self-pay

## 2023-10-29 DIAGNOSIS — J4541 Moderate persistent asthma with (acute) exacerbation: Secondary | ICD-10-CM | POA: Insufficient documentation

## 2023-10-29 LAB — BASIC METABOLIC PANEL WITH GFR
Anion gap: 13 (ref 5–15)
BUN: 12 mg/dL (ref 6–20)
CO2: 17 mmol/L — ABNORMAL LOW (ref 22–32)
Calcium: 9.5 mg/dL (ref 8.9–10.3)
Chloride: 110 mmol/L (ref 98–111)
Creatinine, Ser: 1.01 mg/dL (ref 0.61–1.24)
GFR, Estimated: >60 mL/min (ref >60)
Glucose, Bld: 119 mg/dL — ABNORMAL HIGH (ref 70–99)
Potassium: 3.8 mmol/L (ref 3.5–5.1)
Sodium: 140 mmol/L (ref 135–145)

## 2023-10-29 LAB — CBC
HCT: 49.4 % (ref 39.0–52.0)
Hemoglobin: 16.6 g/dL (ref 13.0–17.0)
MCH: 30.4 pg (ref 26.0–34.0)
MCHC: 33.6 g/dL (ref 30.0–36.0)
MCV: 90.5 fL (ref 80.0–100.0)
Platelets: 285 10*3/uL (ref 150–400)
RBC: 5.46 MIL/uL (ref 4.22–5.81)
RDW: 12.6 % (ref 11.5–15.5)
WBC: 8 10*3/uL (ref 4.0–10.5)
nRBC: 0 % (ref 0.0–0.2)

## 2023-10-29 LAB — TROPONIN I (HIGH SENSITIVITY): Troponin I (High Sensitivity): 5 ng/L (ref <18)

## 2023-10-29 LAB — RESP PANEL BY RT-PCR (RSV, FLU A&B, COVID)  RVPGX2
Influenza A by PCR: NEGATIVE
Influenza B by PCR: NEGATIVE
Resp Syncytial Virus by PCR: NEGATIVE
SARS Coronavirus 2 by RT PCR: NEGATIVE

## 2023-10-29 LAB — D-DIMER, QUANTITATIVE: D-Dimer, Quant: <0.27 ug{FEU}/mL (ref 0.00–0.50)

## 2023-10-29 MED ORDER — METHYLPREDNISOLONE SODIUM SUCC 125 MG IJ SOLR
125.0000 mg | Freq: Once | INTRAMUSCULAR | Status: AC
  Administered 2023-10-29: 125 mg via INTRAVENOUS
  Filled 2023-10-29: qty 2

## 2023-10-29 MED ORDER — PREDNISONE 10 MG PO TABS: 40.0000 mg | ORAL_TABLET | Freq: Every day | ORAL | 0 refills | Status: AC

## 2023-10-29 MED ORDER — IPRATROPIUM-ALBUTEROL 0.5-2.5 (3) MG/3ML IN SOLN
3.0000 mL | Freq: Once | RESPIRATORY_TRACT | Status: AC
  Administered 2023-10-29: 3 mL via RESPIRATORY_TRACT
  Filled 2023-10-29: qty 3

## 2023-10-29 MED ORDER — ALBUTEROL SULFATE HFA 108 (90 BASE) MCG/ACT IN AERS: 2.0000 | INHALATION_SPRAY | Freq: Four times a day (QID) | RESPIRATORY_TRACT | 2 refills | Status: DC | PRN

## 2023-10-29 MED ORDER — BUDESONIDE 0.25 MG/2ML IN SUSP: 0.2500 mg | Freq: Every day | RESPIRATORY_TRACT | 1 refills | Status: DC

## 2023-10-29 MED ORDER — ALBUTEROL SULFATE (2.5 MG/3ML) 0.083% IN NEBU: 2.5000 mg | INHALATION_SOLUTION | RESPIRATORY_TRACT | 1 refills | Status: DC | PRN

## 2023-10-29 NOTE — Discharge Instructions (Signed)
 I have sent steroids, albuterol  inhaler, and nebulizer solution to your pharmacy.  Please take these as prescribed and follow-up with your primary care provider as needed.

## 2023-10-29 NOTE — ED Triage Notes (Signed)
 Patient to ED via POV for SOB. Ongoing for the past 3-4 days. Hx of PE and asthma. Speaking in full sentences without difficulty. Stating also having centralized CP that radiates into back also for 3-4 days.

## 2023-10-29 NOTE — ED Provider Notes (Signed)
 [facility] Provider Note    Event Date/Time   First MD Initiated Contact with Patient 10/29/23 1748     (approximate)   History   Shortness of Breath   HPI Shane Leach is a 38 y.o. male with history of asthma and pulmonary embolism now off anticoagulation presenting today for shortness of breath.  Patient states he has had worsening symptoms over the past week.  He has had productive cough with clear sputum.  Feels chest tightness similar to his asthma exacerbations.  Reports being out of his albuterol  and nebulizers during this time.  Otherwise denies any other acute chest pain, nausea, vomiting, abdominal pain, leg pain, leg swelling.     Physical Exam   Triage Vital Signs: ED Triage Vitals  Encounter Vitals Group     BP 10/29/23 1648 (!) 170/95     Systolic BP Percentile --      Diastolic BP Percentile --      Pulse Rate 10/29/23 1648 (!) 106     Resp 10/29/23 1648 17     Temp 10/29/23 1648 98.7 F (37.1 C)     Temp Source 10/29/23 1648 Oral     SpO2 10/29/23 1648 95 %     Weight 10/29/23 1646 250 lb (113.4 kg)     Height 10/29/23 1646 5\' 10"  (1.778 m)     Head Circumference --      Peak Flow --      Pain Score 10/29/23 1646 7     Pain Loc --      Pain Education --      Exclude from Growth Chart --     Most recent vital signs: Vitals:   10/29/23 1648  BP: (!) 170/95  Pulse: (!) 106  Resp: 17  Temp: 98.7 F (37.1 C)  SpO2: 95%   Physical Exam: I have reviewed the vital signs and nursing notes. General: Awake, alert, no acute distress.  Nontoxic appearing. Head:  Atraumatic, normocephalic.   ENT:  EOM intact, PERRL. Oral mucosa is pink and moist with no lesions. Neck: Neck is supple with full range of motion, No meningeal signs. Cardiovascular:  RRR, No murmurs. Peripheral pulses palpable and equal bilaterally. Respiratory:  Symmetrical chest wall expansion.  Mild tachypnea with notable wheezing both inspiratory and  expiratory throughout all lung fields Musculoskeletal:  No cyanosis or edema. Moving extremities with full ROM Abdomen:  Soft, nontender, nondistended. Neuro:  GCS 15, moving all four extremities, interacting appropriately. Speech clear. Psych:  Calm, appropriate.   Skin:  Warm, dry, no rash.    ED Results / Procedures / Treatments   Labs (all labs ordered are listed, but only abnormal results are displayed) Labs Reviewed  BASIC METABOLIC PANEL WITH GFR - Abnormal; Notable for the following components:      Result Value   CO2 17 (*)    Glucose, Bld 119 (*)    All other components within normal limits  RESP PANEL BY RT-PCR (RSV, FLU A&B, COVID)  RVPGX2  CBC  D-DIMER, QUANTITATIVE  TROPONIN I (HIGH SENSITIVITY)     EKG    RADIOLOGY Independently interpreted chest x-ray with no acute pathology   PROCEDURES:  Critical Care performed: No  Procedures   MEDICATIONS ORDERED IN ED: Medications  ipratropium-albuterol  (DUONEB) 0.5-2.5 (3) MG/3ML nebulizer solution 3 mL (3 mLs Nebulization Given 10/29/23 1808)  methylPREDNISolone  sodium succinate  (SOLU-MEDROL ) 125 mg/2 mL injection 125 mg (125 mg Intravenous Given 10/29/23 1808)  ipratropium-albuterol  (DUONEB)  0.5-2.5 (3) MG/3ML nebulizer solution 3 mL (3 mLs Nebulization Given 10/29/23 1808)  ipratropium-albuterol  (DUONEB) 0.5-2.5 (3) MG/3ML nebulizer solution 3 mL (3 mLs Nebulization Given 10/29/23 1957)     IMPRESSION / MDM / ASSESSMENT AND PLAN / ED COURSE  I reviewed the triage vital signs and the nursing notes.                              Differential diagnosis includes, but is not limited to, asthma exacerbation, pneumonia, COVID, flu, RSV, lower suspicion for PE  Patient's presentation is most consistent with acute complicated illness / injury requiring diagnostic workup.  Patient is a 38 year old male presenting today for shortness of breath and cough x 1 week.  Notable wheezing on exam which is more likely asthma  related and he has also been out of his home inhalers and nebulizers.  Will give DuoNeb treatments along with Solu-Medrol .  Patient does have a history of PE and not on anticoagulation as he was taken off it 1 year ago with resolution.  We discussed how his symptoms today seem much more likely related to asthma which he agrees with.  Discussed D-dimer versus CT scan and he feels comfortable just getting the D-dimer at this time.  No hypoxia on room air.  CBC, BMP, and troponin largely reassuring outside of mild hypocarbia likely in the setting of asthma exacerbation.  Chest x-ray is negative.  D-dimer is negative with no concern for blood clot.  Negative for COVID, flu, RSV.  Patient was reassessed following DuoNeb treatments with almost complete resolution in wheezing symptoms and feeling back at his baseline.  Vital signs have normalized with no ongoing tachycardia.  Symptoms today are all consistent with asthma exacerbation and patient safer discharge.  Represcribed his albuterol  inhaler and nebulizer treatments.  Started on a course of prednisone .  Given strict return precautions.  The patient is on the cardiac monitor to evaluate for evidence of arrhythmia and/or significant heart rate changes. Clinical Course as of 10/29/23 2028  Wed Oct 29, 2023  1935 Patient reassessed and feeling significantly better.  Has very trace wheezing present at this time which is drastic improvement from earlier.  He requested 1 more breathing treatment while awaiting completion of laboratory workup. [DW]  2026 Patient feeling completely back to baseline at this time.  Vital signs have normalized with no ongoing symptoms.  Will discharge for asthma exacerbation [DW]    Clinical Course User Index [DW] Kandee Orion, MD     FINAL CLINICAL IMPRESSION(S) / ED DIAGNOSES   Final diagnoses:  Moderate persistent asthma with exacerbation     Rx / DC Orders   ED Discharge Orders          Ordered    predniSONE   (DELTASONE ) 10 MG tablet  Daily        10/29/23 2028    albuterol  (PROVENTIL ) (2.5 MG/3ML) 0.083% nebulizer solution  Every 4 hours PRN        10/29/23 2028    albuterol  (VENTOLIN  HFA) 108 (90 Base) MCG/ACT inhaler  Every 6 hours PRN        10/29/23 2028    budesonide  (PULMICORT ) 0.25 MG/2ML nebulizer solution  Daily        10/29/23 2028             Note:  This document was prepared using Dragon voice recognition software and may include unintentional dictation errors.   Karlynn Oyster,  Achilles Holes, MD 10/29/23 2029

## 2023-12-15 ENCOUNTER — Telehealth: Payer: Self-pay | Admitting: Physician Assistant

## 2023-12-15 DIAGNOSIS — J45909 Unspecified asthma, uncomplicated: Secondary | ICD-10-CM

## 2023-12-15 DIAGNOSIS — Z760 Encounter for issue of repeat prescription: Secondary | ICD-10-CM

## 2023-12-15 MED ORDER — BUDESONIDE 0.25 MG/2ML IN SUSP: 0.2500 mg | Freq: Every day | RESPIRATORY_TRACT | 1 refills | Status: DC

## 2023-12-15 MED ORDER — ALBUTEROL SULFATE (2.5 MG/3ML) 0.083% IN NEBU: 2.5000 mg | INHALATION_SOLUTION | RESPIRATORY_TRACT | 1 refills | Status: DC | PRN

## 2023-12-15 NOTE — Patient Instructions (Signed)
  Shane Leach, thank you for joining Shane Shuck, PA-C for today's virtual visit.  While this provider is not your primary care provider (PCP), if your PCP is located in our provider database this encounter information will be shared with them immediately following your visit.   A [place] MyChart account gives you access to today's visit and all your visits, tests, and labs performed at [facility]  click here if you don't have a [facility] MyChart account or go to [email or web address]  Consent: (Patient) Shane Leach provided verbal consent for this virtual visit at the beginning of the encounter.  Current Medications:  Current Outpatient Medications:    acetaminophen  (TYLENOL ) 325 MG tablet, Take 650 mg by mouth every 6 (six) hours as needed., Disp: , Rfl:    albuterol  (PROVENTIL ) (2.5 MG/3ML) 0.083% nebulizer solution, Inhale 3 mLs (2.5 mg total) by nebulization once every 4 (four) hours as needed for wheezing or shortness of breath., Disp: 75 mL, Rfl: 2   albuterol  (PROVENTIL ) (2.5 MG/3ML) 0.083% nebulizer solution, Take 3 mLs (2.5 mg total) by nebulization every 4 (four) hours as needed for wheezing or shortness of breath., Disp: 75 mL, Rfl: 1   budesonide  (PULMICORT ) 0.25 MG/2ML nebulizer solution, Take 2 mLs (0.25 mg total) by nebulization daily., Disp: 60 mL, Rfl: 1   budesonide -formoterol  (SYMBICORT ) 160-4.5 MCG/ACT inhaler, INHALE 2 PUFFS INTO THE LUNGS ONCE EVERY 12 HOURS., Disp: 10.2 g, Rfl: 11   levETIRAcetam  (KEPPRA ) 1000 MG tablet, TAKE ONE TABLET BY MOUTH 2 TIMES A DAY, Disp: 60 tablet, Rfl: 3   omeprazole  (PRILOSEC  OTC) 20 MG tablet, Take 1 tablet (20 mg total) by mouth daily., Disp: 30 tablet, Rfl: 0   Medications ordered in this encounter:  No orders of the defined types were placed in this encounter.    *If you need refills on other medications prior to your next appointment, please contact your pharmacy*  Follow-Up: Call back or seek  an in-person evaluation if the symptoms worsen or if the condition fails to improve as anticipated.  [facility] [phone]  Other Instructions Please report to the nearest Emergency room with any worsening symptoms. Follow up with primary care provider (PCP) in 2 -3 days.     If you have been instructed to have an in-person evaluation today at a local Urgent Care facility, please use the link below. It will take you to a list of all of our available [facility], including address, phone number and hours of operation. Please do not delay care.  [facility]  If you or a family member do not have a primary care provider, use the link below to schedule a visit and establish care. When you choose a [facility] primary care physician or advanced practice provider, you gain a long-term partner in health. Find a Primary Care Provider  Learn more about [facility]'s in-office and virtual care options: [facility] - Get Care Now

## 2023-12-15 NOTE — Progress Notes (Signed)
 Virtual Visit Consent   Shane Leach, you are scheduled for a virtual visit with a [facility] provider today. Just as with appointments in the office, your consent must be obtained to participate. Your consent will be active for this visit and any virtual visit you may have with one of our providers in the next 365 days. If you have a MyChart account, a copy of this consent can be sent to you electronically.  As this is a virtual visit, video technology does not allow for your provider to perform a traditional examination. This may limit your provider's ability to fully assess your condition. If your provider identifies any concerns that need to be evaluated in person or the need to arrange testing (such as labs, EKG, etc.), we will make arrangements to do so. Although advances in technology are sophisticated, we cannot ensure that it will always work on either your end or our end. If the connection with a video visit is poor, the visit may have to be switched to a telephone visit. With either a video or telephone visit, we are not always able to ensure that we have a secure connection.  By engaging in this virtual visit, you consent to the provision of healthcare and authorize for your insurance to be billed (if applicable) for the services provided during this visit. Depending on your insurance coverage, you may receive a charge related to this service.  I need to obtain your verbal consent now. Are you willing to proceed with your visit today? Shane Leach has provided verbal consent on 12/15/2023 for a virtual visit (video or telephone). Shane Leach, [place]  Date: 12/15/2023 9:45 AM   Virtual Visit via Video Note   I, Shane Leach, connected with  [name]  ([id number], 1985/08/01) on 12/15/23 at  9:45 AM EDT by a video-enabled telemedicine application and verified that I am speaking with the correct person using two identifiers.  Location: Patient: Virtual Visit Location  Patient: Home Provider: Virtual Visit Location Provider: Home Office   I discussed the limitations of evaluation and management by telemedicine and the availability of in person appointments. The patient expressed understanding and agreed to proceed.    History of Present Illness: Shane Leach is a 38 y.o. who identifies as a male who was assigned male at birth, and is being seen today for asthma exacerbation.  HPI: Medication Refill This is a recurrent problem. The current episode started yesterday. The problem occurs constantly. The problem has been unchanged. Associated symptoms include coughing. Pertinent negatives include no abdominal pain, anorexia, arthralgias, change in bowel habit, chest pain, chills, congestion, diaphoresis, fatigue, fever, headaches, joint swelling, myalgias, nausea, neck pain, numbness, rash, sore throat, swollen glands, urinary symptoms, vertigo, visual change, vomiting or weakness. The symptoms are aggravated by coughing. He has tried acetaminophen , lying down and rest for the symptoms. The treatment provided mild relief.    Problems:  Patient Active Problem List   Diagnosis Date Noted   Elevated blood pressure reading 04/19/2021   Polycythemia 04/05/2021   Increased heart rate 09/17/2020   History of seizure 09/13/2020   Cough 09/13/2020   Abnormal laboratory test result 09/13/2020   Pulmonary embolism on right (HCC) 08/01/2020   Alcohol dependence (HCC) 08/01/2020   Asthma 11/17/2016    Allergies:  Allergies  Allergen Reactions   Dilantin  [Phenytoin  Sodium Extended] Other (See Comments)    Makes patient feel loopy   Penicillins Other (See Comments)    Has patient  had a PCN reaction causing immediate rash, facial/tongue/throat swelling, SOB or lightheadedness with hypotension: Unknown Has patient had a PCN reaction causing severe rash involving mucus membranes or skin necrosis: Unknown Has patient had a PCN reaction that required  hospitalization: Unknown Has patient had a PCN reaction occurring within the last 10 years: Unknown If all of the above answers are NO, then may proceed with Cephalosporin use.   Phenytoin  Swelling    Causes more seizures, insomnia, headache   Tegretol [Carbamazepine] Other (See Comments)   Iodinated Contrast Media Itching   Medications:  Current Outpatient Medications:    acetaminophen  (TYLENOL ) 325 MG tablet, Take 650 mg by mouth every 6 (six) hours as needed., Disp: , Rfl:    albuterol  (PROVENTIL ) (2.5 MG/3ML) 0.083% nebulizer solution, Inhale 3 mLs (2.5 mg total) by nebulization once every 4 (four) hours as needed for wheezing or shortness of breath., Disp: 75 mL, Rfl: 2   albuterol  (PROVENTIL ) (2.5 MG/3ML) 0.083% nebulizer solution, Take 3 mLs (2.5 mg total) by nebulization every 4 (four) hours as needed for wheezing or shortness of breath., Disp: 75 mL, Rfl: 1   budesonide  (PULMICORT ) 0.25 MG/2ML nebulizer solution, Take 2 mLs (0.25 mg total) by nebulization daily., Disp: 60 mL, Rfl: 1   budesonide -formoterol  (SYMBICORT ) 160-4.5 MCG/ACT inhaler, INHALE 2 PUFFS INTO THE LUNGS ONCE EVERY 12 HOURS., Disp: 10.2 g, Rfl: 11   levETIRAcetam  (KEPPRA ) 1000 MG tablet, TAKE ONE TABLET BY MOUTH 2 TIMES A DAY, Disp: 60 tablet, Rfl: 3   omeprazole  (PRILOSEC  OTC) 20 MG tablet, Take 1 tablet (20 mg total) by mouth daily., Disp: 30 tablet, Rfl: 0  Observations/Objective: Patient is well-developed, well-nourished in no acute distress.  Resting comfortably  at home.  Head is normocephalic, atraumatic.  No labored breathing.  Speech is clear and coherent with logical content.  Patient is alert and oriented at baseline.    Assessment and Plan: 1. Uncomplicated asthma, unspecified asthma severity, unspecified whether persistent  2. Medication refill (Primary)  Patient denies shortness of breath at this time. States he has ran out of his albuterol  and needs refills. No chest pain or difficulty  breathing during call. Gave strict ER precautions and stated if medicines do not improve symptoms he will need to be seen in person. He verbalized understanding and I informed the patient that he will need to establish care with a PCP. No emergent or urgent conditions/symptoms identified during this call.   Follow Up Instructions: I discussed the assessment and treatment plan with the patient. The patient was provided an opportunity to ask questions and all were answered. The patient agreed with the plan and demonstrated an understanding of the instructions.  A copy of instructions were sent to the patient via MyChart unless otherwise noted below.    The patient was advised to call back or seek an in-person evaluation if the symptoms worsen or if the condition fails to improve as anticipated.    Shane Shuck, PA-C

## 2024-02-24 ENCOUNTER — Emergency Department: Payer: Self-pay

## 2024-02-24 ENCOUNTER — Other Ambulatory Visit: Payer: Self-pay

## 2024-02-24 ENCOUNTER — Observation Stay
Admission: EM | Admit: 2024-02-24 | Discharge: 2024-02-25 | Disposition: A | Discharge status: Home / Self Care | Payer: Self-pay | Attending: Student | Admitting: Student

## 2024-02-24 DIAGNOSIS — Z86711 Personal history of pulmonary embolism: Secondary | ICD-10-CM | POA: Insufficient documentation

## 2024-02-24 DIAGNOSIS — R030 Elevated blood-pressure reading, without diagnosis of hypertension: Secondary | ICD-10-CM | POA: Insufficient documentation

## 2024-02-24 DIAGNOSIS — J45901 Unspecified asthma with (acute) exacerbation: Principal | ICD-10-CM | POA: Diagnosis present

## 2024-02-24 DIAGNOSIS — I2699 Other pulmonary embolism without acute cor pulmonale: Secondary | ICD-10-CM | POA: Diagnosis present

## 2024-02-24 DIAGNOSIS — J45909 Unspecified asthma, uncomplicated: Secondary | ICD-10-CM

## 2024-02-24 DIAGNOSIS — Z720 Tobacco use: Secondary | ICD-10-CM | POA: Diagnosis present

## 2024-02-24 DIAGNOSIS — R0602 Shortness of breath: Secondary | ICD-10-CM

## 2024-02-24 DIAGNOSIS — F1020 Alcohol dependence, uncomplicated: Secondary | ICD-10-CM | POA: Diagnosis present

## 2024-02-24 DIAGNOSIS — R52 Pain, unspecified: Secondary | ICD-10-CM | POA: Insufficient documentation

## 2024-02-24 DIAGNOSIS — R059 Cough, unspecified: Secondary | ICD-10-CM | POA: Insufficient documentation

## 2024-02-24 DIAGNOSIS — Z87891 Personal history of nicotine dependence: Secondary | ICD-10-CM | POA: Insufficient documentation

## 2024-02-24 DIAGNOSIS — Z87898 Personal history of other specified conditions: Secondary | ICD-10-CM

## 2024-02-24 DIAGNOSIS — E559 Vitamin D deficiency, unspecified: Secondary | ICD-10-CM | POA: Insufficient documentation

## 2024-02-24 DIAGNOSIS — F1290 Cannabis use, unspecified, uncomplicated: Secondary | ICD-10-CM | POA: Insufficient documentation

## 2024-02-24 DIAGNOSIS — J4551 Severe persistent asthma with (acute) exacerbation: Secondary | ICD-10-CM

## 2024-02-24 DIAGNOSIS — Z8669 Personal history of other diseases of the nervous system and sense organs: Secondary | ICD-10-CM | POA: Insufficient documentation

## 2024-02-24 LAB — COMPREHENSIVE METABOLIC PANEL WITH GFR
ALT: 33 U/L (ref 0–44)
AST: 29 U/L (ref 15–41)
Albumin: 4.6 g/dL (ref 3.5–5.0)
Alkaline Phosphatase: 63 U/L (ref 38–126)
Anion gap: 10 (ref 5–15)
BUN: 10 mg/dL (ref 6–20)
CO2: 25 mmol/L (ref 22–32)
Calcium: 9.6 mg/dL (ref 8.9–10.3)
Chloride: 103 mmol/L (ref 98–111)
Creatinine, Ser: 1.23 mg/dL (ref 0.61–1.24)
GFR, Estimated: >60 mL/min (ref >60)
Glucose, Bld: 102 mg/dL — ABNORMAL HIGH (ref 70–99)
Potassium: 4.2 mmol/L (ref 3.5–5.1)
Sodium: 138 mmol/L (ref 135–145)
Total Bilirubin: 0.9 mg/dL (ref 0.0–1.2)
Total Protein: 8.1 g/dL (ref 6.5–8.1)

## 2024-02-24 LAB — TROPONIN I (HIGH SENSITIVITY): Troponin I (High Sensitivity): 6 ng/L (ref <18)

## 2024-02-24 LAB — CBC WITH DIFFERENTIAL/PLATELET
Abs Immature Granulocytes: 0.03 K/uL (ref 0.00–0.07)
Basophils Absolute: 0 K/uL (ref 0.0–0.1)
Basophils Relative: 1 %
Eosinophils Absolute: 0.1 K/uL (ref 0.0–0.5)
Eosinophils Relative: 2 %
HCT: 48.8 % (ref 39.0–52.0)
Hemoglobin: 16.4 g/dL (ref 13.0–17.0)
Immature Granulocytes: 0 %
Lymphocytes Relative: 12 %
Lymphs Abs: 0.9 K/uL (ref 0.7–4.0)
MCH: 30.3 pg (ref 26.0–34.0)
MCHC: 33.6 g/dL (ref 30.0–36.0)
MCV: 90 fL (ref 80.0–100.0)
Monocytes Absolute: 0.7 K/uL (ref 0.1–1.0)
Monocytes Relative: 10 %
Neutro Abs: 5.7 K/uL (ref 1.7–7.7)
Neutrophils Relative %: 75 %
Platelets: 275 K/uL (ref 150–400)
RBC: 5.42 MIL/uL (ref 4.22–5.81)
RDW: 12.9 % (ref 11.5–15.5)
WBC: 7.6 K/uL (ref 4.0–10.5)
nRBC: 0 % (ref 0.0–0.2)

## 2024-02-24 LAB — CREATININE, SERUM
Creatinine, Ser: 1.2 mg/dL (ref 0.61–1.24)
GFR, Estimated: >60 mL/min (ref >60)

## 2024-02-24 LAB — RESP PANEL BY RT-PCR (RSV, FLU A&B, COVID)  RVPGX2
Influenza A by PCR: NEGATIVE
Influenza B by PCR: NEGATIVE
Resp Syncytial Virus by PCR: NEGATIVE
SARS Coronavirus 2 by RT PCR: NEGATIVE

## 2024-02-24 LAB — CBC
HCT: 44.6 % (ref 39.0–52.0)
Hemoglobin: 15.4 g/dL (ref 13.0–17.0)
MCH: 31.1 pg (ref 26.0–34.0)
MCHC: 34.5 g/dL (ref 30.0–36.0)
MCV: 90.1 fL (ref 80.0–100.0)
Platelets: 246 K/uL (ref 150–400)
RBC: 4.95 MIL/uL (ref 4.22–5.81)
RDW: 13 % (ref 11.5–15.5)
WBC: 10.8 K/uL — ABNORMAL HIGH (ref 4.0–10.5)
nRBC: 0 % (ref 0.0–0.2)

## 2024-02-24 LAB — D-DIMER, QUANTITATIVE: D-Dimer, Quant: 0.39 ug{FEU}/mL (ref 0.00–0.50)

## 2024-02-24 MED ORDER — ALBUTEROL SULFATE (2.5 MG/3ML) 0.083% IN NEBU: 2.5000 mg | INHALATION_SOLUTION | RESPIRATORY_TRACT | Status: DC

## 2024-02-24 MED ORDER — ALBUTEROL SULFATE (2.5 MG/3ML) 0.083% IN NEBU: 2.5000 mg | INHALATION_SOLUTION | RESPIRATORY_TRACT | Status: DC | PRN

## 2024-02-24 MED ORDER — PREDNISONE 20 MG PO TABS: 40.0000 mg | ORAL_TABLET | Freq: Every day | ORAL | Status: DC

## 2024-02-24 MED ORDER — IPRATROPIUM-ALBUTEROL 0.5-2.5 (3) MG/3ML IN SOLN
9.0000 mL | Freq: Once | RESPIRATORY_TRACT | Status: AC
  Administered 2024-02-24: 9 mL via RESPIRATORY_TRACT
  Filled 2024-02-24: qty 9

## 2024-02-24 MED ORDER — IPRATROPIUM-ALBUTEROL 0.5-2.5 (3) MG/3ML IN SOLN
3.0000 mL | Freq: Four times a day (QID) | RESPIRATORY_TRACT | Status: DC
  Administered 2024-02-25 (×3): 3 mL via RESPIRATORY_TRACT
  Filled 2024-02-24 (×3): qty 3

## 2024-02-24 MED ORDER — METHYLPREDNISOLONE SODIUM SUCC 40 MG IJ SOLR
40.0000 mg | Freq: Two times a day (BID) | INTRAMUSCULAR | Status: AC
  Administered 2024-02-24 – 2024-02-25 (×2): 40 mg via INTRAVENOUS
  Filled 2024-02-24 (×2): qty 1

## 2024-02-24 MED ORDER — ALBUTEROL (5 MG/ML) CONTINUOUS INHALATION SOLN
10.0000 mg/h | INHALATION_SOLUTION | Freq: Once | RESPIRATORY_TRACT | Status: AC
Start: 2024-02-24 — End: 2024-02-24
  Administered 2024-02-24: 10 mg/h via RESPIRATORY_TRACT
  Filled 2024-02-24: qty 20

## 2024-02-24 MED ORDER — LACTATED RINGERS IV SOLN: INTRAVENOUS | Status: DC

## 2024-02-24 MED ORDER — ACETAMINOPHEN 500 MG PO TABS
1000.0000 mg | ORAL_TABLET | Freq: Once | ORAL | Status: AC
  Administered 2024-02-24: 1000 mg via ORAL
  Filled 2024-02-24: qty 2

## 2024-02-24 MED ORDER — MAGNESIUM SULFATE 2 GM/50ML IV SOLN
2.0000 g | Freq: Once | INTRAVENOUS | Status: AC
  Administered 2024-02-24: 2 g via INTRAVENOUS
  Filled 2024-02-24: qty 50

## 2024-02-24 MED ORDER — SODIUM CHLORIDE 0.9 % IV BOLUS
1000.0000 mL | Freq: Once | INTRAVENOUS | Status: AC
  Administered 2024-02-24: 1000 mL via INTRAVENOUS

## 2024-02-24 MED ORDER — ENOXAPARIN SODIUM 60 MG/0.6ML IJ SOSY
55.0000 mg | PREFILLED_SYRINGE | INTRAMUSCULAR | Status: DC
Start: 2024-02-25 — End: 2024-02-25
  Administered 2024-02-25: 55 mg via SUBCUTANEOUS
  Filled 2024-02-24: qty 0.6

## 2024-02-24 MED ORDER — PREDNISONE 20 MG PO TABS
60.0000 mg | ORAL_TABLET | Freq: Once | ORAL | Status: AC
  Administered 2024-02-24: 60 mg via ORAL
  Filled 2024-02-24: qty 3

## 2024-02-24 NOTE — ED Triage Notes (Signed)
 Cough, sob, body aches x 1 week.  Hx of Asthma. Out of nebulizer and inhaler.  AAOx3.  Skin warm and dry. Productive cough. Inspiratory and expiratory wheezes audible.

## 2024-02-24 NOTE — ED Provider Notes (Signed)
 [facility] Provider Note    Event Date/Time   First MD Initiated Contact with Patient 02/24/24 1809     (approximate)   History   Shortness of Breath   HPI  Shane Leach is a 38 y.o. male with history of asthma, prior history of PE not on anticoagulation since 2022, history of seizures on Keppra  senting with shortness of breath for the past week.  Associated with productive cough and bodyaches.  No fevers.  He does note some chest pain and tightness.  No unilateral Or Tenderness, No Recent Travel or Surgeries, No Hormone Use.  No History of Malignancies.  States That He Is off the Blood Thinning Medication but They Could Not Find a Reason for His Pulmonary Embolism.  States that he ran out of his albuterol  inhaler a week ago.  On independent chart review, he was seen by primary care in July, had his medications refilled, history of uncomplicated asthma.     Physical Exam   Triage Vital Signs: ED Triage Vitals [02/24/24 1719]  Encounter Vitals Group     BP (!) 160/111     Girls Systolic BP Percentile      Girls Diastolic BP Percentile      Boys Systolic BP Percentile      Boys Diastolic BP Percentile      Pulse Rate (!) 105     Resp (!) 26     Temp 98.2 F (36.8 C)     Temp Source Oral     SpO2 100 %     Weight 250 lb (113.4 kg)     Height      Head Circumference      Peak Flow      Pain Score 0     Pain Loc      Pain Education      Exclude from Growth Chart     Most recent vital signs: Vitals:   02/24/24 2100 02/24/24 2209  BP: 118/80   Pulse: 90   Resp: 19   Temp:  97.8 F (36.6 C)  SpO2: 100%      General: Awake, no distress.  CV:  Good peripheral perfusion.  Resp:  Normal effort.  Diffuse wheezing bilaterally, no increased work of breathing Abd:  No distention.  Soft nontender Other:  No unilateral calf sign or tenderness, no lower extremity edema.   ED Results / Procedures / Treatments   Labs (all labs ordered  are listed, but only abnormal results are displayed) Labs Reviewed  COMPREHENSIVE METABOLIC PANEL WITH GFR - Abnormal; Notable for the following components:      Result Value   Glucose, Bld 102 (*)    All other components within normal limits  RESP PANEL BY RT-PCR (RSV, FLU A&B, COVID)  RVPGX2  CBC WITH DIFFERENTIAL/PLATELET  D-DIMER, QUANTITATIVE  TROPONIN I (HIGH SENSITIVITY)     EKG  EKG shows, sinus rhythm, rate 95, normal QS, normal QTc, no obvious ischemic ST elevation, baseline is wandering, not significantly changed compared to prior   RADIOLOGY On my independent interpretation, x-ray without obvious consolidation   PROCEDURES:  Critical Care performed: Yes, see critical care procedure note(s)  .Critical Care  Performed by: Waymond Lorelle Cummins, MD Authorized by: Waymond Lorelle Cummins, MD   Critical care provider statement:    Critical care time (minutes):  40   Critical care was necessary to treat or prevent imminent or life-threatening deterioration of the following conditions:  Respiratory failure  Critical care was time spent personally by me on the following activities:  Development of treatment plan with patient or surrogate, discussions with consultants, evaluation of patient's response to treatment, examination of patient, ordering and review of laboratory studies, ordering and review of radiographic studies, ordering and performing treatments and interventions, pulse oximetry, re-evaluation of patient's condition and review of old charts    MEDICATIONS ORDERED IN ED: Medications  albuterol  (PROVENTIL ) (2.5 MG/3ML) 0.083% nebulizer solution 2.5 mg (has no administration in time range)  ipratropium-albuterol  (DUONEB) 0.5-2.5 (3) MG/3ML nebulizer solution 9 mL (9 mLs Nebulization Given 02/24/24 1841)  predniSONE  (DELTASONE ) tablet 60 mg (60 mg Oral Given 02/24/24 1839)  acetaminophen  (TYLENOL ) tablet 1,000 mg (1,000 mg Oral Given 02/24/24 1855)  albuterol  (PROVENTIL ,VENTOLIN )  solution continuous neb (10 mg/hr Nebulization Given 02/24/24 2045)  magnesium  sulfate IVPB 2 g 50 mL (0 g Intravenous Stopped 02/24/24 2149)  sodium chloride  0.9 % bolus 1,000 mL (1,000 mLs Intravenous New Bag/Given 02/24/24 2203)     IMPRESSION / MDM / ASSESSMENT AND PLAN / ED COURSE  I reviewed the triage vital signs and the nursing notes.                              Differential diagnosis includes, but is not limited to, asthma exacerbation, viral illness, pneumonia, arrhythmia, ACS, did consider PE but patient is not hypoxic, he is diffusely wheezing which is more consistent with asthma exacerbation.  Will get labs, EKG, troponin, chest x-ray, viral swab, D-dimer.  Will give him some DuoNebs and prednisone .  Patient's presentation is most consistent with acute presentation with potential threat to life or bodily function.  Independent interpretation of labs and imaging below.  On reassessment after DuoNebs, patient states that shortness of breath is improving but not completely gone, he still diffusely wheezing.  Will give him some IV mag as well as continuous nebs.  On reassessment patient still wheezing mildly, is tachycardic, likely from the albuterol  nebs, put in for some fluids for him, given his persistent symptoms, he will need to be admitted for further management.  Will put in for as needed albuterol  nebs.  Consulted hospitalist who is agreeable with the plan for admission and will evaluate the patient.  He is admitted.  The patient is on the cardiac monitor to evaluate for evidence of arrhythmia and/or significant heart rate changes.   Clinical Course as of 02/24/24 2255  Tue Feb 24, 2024  1813 DG Chest 2 View No acute cardiopulmonary abnormality.  [TT]  1909 Independent review of labs, troponins not elevated, electrolytes not severely deranged, D-dimer is not elevated, no leukocytosis. [TT]    Clinical Course User Index [TT] Waymond Lorelle Cummins, MD     FINAL CLINICAL  IMPRESSION(S) / ED DIAGNOSES   Final diagnoses:  SOB (shortness of breath)  Exacerbation of asthma, unspecified asthma severity, unspecified whether persistent  Cough, unspecified type  Body aches     Rx / DC Orders   ED Discharge Orders     None        Note:  This document was prepared using Dragon voice recognition software and may include unintentional dictation errors.    Waymond Lorelle Cummins, MD 02/24/24 [phone]

## 2024-02-24 NOTE — Progress Notes (Signed)
 PHARMACIST - PHYSICIAN COMMUNICATION  CONCERNING:  Enoxaparin  (Lovenox ) for DVT Prophylaxis    RECOMMENDATION: Patient was prescribed enoxaprin 40mg  q24 hours for VTE prophylaxis.   Filed Weights   02/24/24 1719  Weight: 113.4 kg (250 lb)    Body mass index is 35.87 kg/m.  Estimated Creatinine Clearance: 105.8 mL/min (by C-G formula based on SCr of 1.23 mg/dL).   Based on [facility] policy patient is candidate for enoxaparin  0.5mg /kg TBW SQ every 24 hours based on BMI being >30.  DESCRIPTION: Pharmacy has adjusted enoxaparin  dose per [facility] policy.  Patient is now receiving enoxaparin  0.5 mg/kg every 24 hours   Rankin CANDIE Dills, PharmD, [facility] 02/24/2024 11:00 PM

## 2024-02-24 NOTE — ED Notes (Addendum)
 CCMD was called to initiate cardiac monitoring

## 2024-02-24 NOTE — H&P (Signed)
 History and Physical    Patient: Shane Leach FMW:[id number] DOB: July 12, 1985 DOA: 02/24/2024 DOS: the patient was seen and examined on 02/24/2024 PCP: Pcp, No  Patient coming from: Home  Chief Complaint:  Chief Complaint  Patient presents with   Shortness of Breath   HPI: Shane Leach is a 38 y.o. male with medical history significant of asthma, history of pulmonary embolism not on anticoagulation, morbid obesity, seizure disorder, tobacco abuse, history of testicular cancer since childhood who presented to the ER with significant shortness of breath, wheezing.  Patient has had chronic symptoms of asthma.  He also continues to smoke about a pack per day.  Was never officially diagnosed with COPD.  Patient was treated with initial steroids and breathing treatment in the ER but symptoms persist.  Continue to have cough but no fever.  Review of Systems: As mentioned in the history of present illness. All other systems reviewed and are negative. Past Medical History:  Diagnosis Date   Asthma    Bronchitis    Cancer [facility])    Testicular cancer as a child   Epilepsy (HCC)    EtOH dependence (HCC)    In remission   Pulmonary embolism (HCC) 06/19/2020   Seizures (HCC)    Past Surgical History:  Procedure Laterality Date   testicular cancer     right testicle removed   Social History:  reports that he quit smoking about 3 years ago. His smoking use included cigarettes. He started smoking about 18 years ago. He has a 7.5 pack-year smoking history. He has never used smokeless tobacco. He reports current alcohol use of about 14.0 standard drinks of alcohol per week. He reports that he does not currently use drugs after having used the following drugs: Marijuana.  Allergies  Allergen Reactions   Dilantin  [Phenytoin  Sodium Extended] Other (See Comments)    Makes patient feel loopy   Penicillins Other (See Comments)    Has patient had a PCN reaction causing immediate rash,  facial/tongue/throat swelling, SOB or lightheadedness with hypotension: Unknown Has patient had a PCN reaction causing severe rash involving mucus membranes or skin necrosis: Unknown Has patient had a PCN reaction that required hospitalization: Unknown Has patient had a PCN reaction occurring within the last 10 years: Unknown If all of the above answers are NO, then may proceed with Cephalosporin use.   Phenytoin  Swelling    Causes more seizures, insomnia, headache   Tegretol [Carbamazepine] Other (See Comments)   Iodinated Contrast Media Itching    Family History  Problem Relation Age of Onset   Stroke Mother    Deep vein thrombosis Mother    Diabetes Mother    Hypertension Mother    Cancer Father        lung   Cancer Maternal Grandmother    Stroke Maternal Grandfather     Prior to Admission medications   Medication Sig Start Date End Date Taking? Authorizing Provider  acetaminophen  (TYLENOL ) 325 MG tablet Take 650 mg by mouth every 6 (six) hours as needed.    [provider]  albuterol  (PROVENTIL ) (2.5 MG/3ML) 0.083% nebulizer solution Inhale 3 mLs (2.5 mg total) by nebulization once every 4 (four) hours as needed for wheezing or shortness of breath. 05/04/23 05/03/24  Lang Dover, MD  albuterol  (PROVENTIL ) (2.5 MG/3ML) 0.083% nebulizer solution Take 3 mLs (2.5 mg total) by nebulization every 4 (four) hours as needed for wheezing or shortness of breath. 12/15/23 01/14/24  Rolan Berthold, PA-C  budesonide  (PULMICORT )  0.25 MG/2ML nebulizer solution Take 2 mLs (0.25 mg total) by nebulization daily. 12/15/23 02/13/24  McLean, Darius, PA-C  budesonide -formoterol  (SYMBICORT ) 160-4.5 MCG/ACT inhaler INHALE 2 PUFFS INTO THE LUNGS ONCE EVERY 12 HOURS. 01/07/23   Cleaster Tinnie LABOR, PA-C  levETIRAcetam  (KEPPRA ) 1000 MG tablet TAKE ONE TABLET BY MOUTH 2 TIMES A DAY 04/05/21 04/05/22  Iloabachie, Chioma E, NP  omeprazole  (PRILOSEC  OTC) 20 MG tablet Take 1 tablet (20 mg total) by mouth  daily. 03/11/22 03/11/23  Suzanne Kirsch, MD  ipratropium-albuterol  (DUONEB) 0.5-2.5 (3) MG/3ML SOLN Take 3 mLs by nebulization every 4 (four) hours as needed. 12/15/19 01/12/20  Gasper Devere ORN, PA-C    Physical Exam: Vitals:   02/24/24 2033 02/24/24 2048 02/24/24 2100 02/24/24 2209  BP: 131/88  118/80   Pulse: 100  90   Resp: (!) 23  19   Temp:    97.8 F (36.6 C)  TempSrc:    Oral  SpO2: 95% 95% 100%   Weight:       Constitutional: Morbidly obese, acutely ill looking, NAD, calm, comfortable Eyes: PERRL, lids and conjunctivae normal ENMT: Mucous membranes are moist. Posterior pharynx clear of any exudate or lesions.Normal dentition.  Neck: normal, supple, no masses, no thyromegaly Respiratory: Decreased air entry with marked expiratory wheezing, no crackles. Normal respiratory effort. No accessory muscle use.  Cardiovascular: Regular rate and rhythm, no murmurs / rubs / gallops. No extremity edema. 2+ pedal pulses. No carotid bruits.  Abdomen: no tenderness, no masses palpated. No hepatosplenomegaly. Bowel sounds positive.  Musculoskeletal: Good range of motion, no joint swelling or tenderness, Skin: no rashes, lesions, ulcers. No induration Neurologic: CN 2-12 grossly intact. Sensation intact, DTR normal. Strength 5/5 in all 4.  Psychiatric: Normal judgment and insight. Alert and oriented x 3. Normal mood  Data Reviewed:  Temperature 98.2, blood pressure 160/111, pulse 105 respirate 26 O2 sat 95% room air CBC and chemistry appear to be within normal.  Acute viral screen is negative for flu RSV and COVID-19.  Chest x-ray showed no acute findings  Assessment and Plan:  #1 acute exacerbation of asthma: Patient will be admitted.  Initiate IV steroids and transition to oral prednisone .  Breathing treatments.  May also consider oral antibiotics since patient smokes which means he may have underlying COPD.  Continue other supportive care.  #2 history of seizure disorder: Patient was  supposed to be on Keppra .  Not sure if he is taking medications.  Patient may resume Keppra  if he agrees to take it.  #3 history of alcohol dependence: Patient has quit alcohol but still smokes.  Counseling provided  #4 tobacco abuse: Patient given to pack cessation counseling.  He refused nicotine patch or gum.  #5 elevated blood pressure without diagnosis of hypertension: Continue monitoring blood pressure.  Continue to monitor  #6 history of pulmonary embolism: Not on anticoagulation.  Continue to monitor    Advance Care Planning:   Code Status: Full Code   Consults: None  Family Communication: No family at bedside  Severity of Illness: The appropriate patient status for this patient is OBSERVATION. Observation status is judged to be reasonable and necessary in order to provide the required intensity of service to ensure the patient's safety. The patient's presenting symptoms, physical exam findings, and initial radiographic and laboratory data in the context of their medical condition is felt to place them at decreased risk for further clinical deterioration. Furthermore, it is anticipated that the patient will be medically stable for discharge from  the hospital within 2 midnights of admission.   AuthorBETHA SIM KNOLL, MD 02/24/2024 10:57 PM  For on call review [email or web address].

## 2024-02-25 ENCOUNTER — Other Ambulatory Visit: Payer: Self-pay

## 2024-02-25 ENCOUNTER — Encounter: Payer: Self-pay | Admitting: Internal Medicine

## 2024-02-25 LAB — CBC
HCT: 47.5 % (ref 39.0–52.0)
Hemoglobin: 15.7 g/dL (ref 13.0–17.0)
MCH: 29.8 pg (ref 26.0–34.0)
MCHC: 33.1 g/dL (ref 30.0–36.0)
MCV: 90.3 fL (ref 80.0–100.0)
Platelets: 288 K/uL (ref 150–400)
RBC: 5.26 MIL/uL (ref 4.22–5.81)
RDW: 13 % (ref 11.5–15.5)
WBC: 6.8 K/uL (ref 4.0–10.5)
nRBC: 0 % (ref 0.0–0.2)

## 2024-02-25 LAB — MAGNESIUM: Magnesium: 2.4 mg/dL (ref 1.7–2.4)

## 2024-02-25 LAB — COMPREHENSIVE METABOLIC PANEL WITH GFR
ALT: 38 U/L (ref 0–44)
AST: 45 U/L — ABNORMAL HIGH (ref 15–41)
Albumin: 4.2 g/dL (ref 3.5–5.0)
Alkaline Phosphatase: 55 U/L (ref 38–126)
Anion gap: 12 (ref 5–15)
BUN: 12 mg/dL (ref 6–20)
CO2: 22 mmol/L (ref 22–32)
Calcium: 9.3 mg/dL (ref 8.9–10.3)
Chloride: 104 mmol/L (ref 98–111)
Creatinine, Ser: 1.14 mg/dL (ref 0.61–1.24)
GFR, Estimated: >60 mL/min (ref >60)
Glucose, Bld: 188 mg/dL — ABNORMAL HIGH (ref 70–99)
Potassium: 4.1 mmol/L (ref 3.5–5.1)
Sodium: 138 mmol/L (ref 135–145)
Total Bilirubin: 0.7 mg/dL (ref 0.0–1.2)
Total Protein: 8 g/dL (ref 6.5–8.1)

## 2024-02-25 LAB — VITAMIN B12: Vitamin B-12: 400 pg/mL (ref 180–914)

## 2024-02-25 LAB — HIV ANTIBODY (ROUTINE TESTING W REFLEX): HIV Screen 4th Generation wRfx: NONREACTIVE

## 2024-02-25 LAB — TSH: TSH: 0.828 u[IU]/mL (ref 0.350–4.500)

## 2024-02-25 LAB — VITAMIN D 25 HYDROXY (VIT D DEFICIENCY, FRACTURES): Vit D, 25-Hydroxy: 29.88 ng/mL — ABNORMAL LOW (ref 30–100)

## 2024-02-25 LAB — PHOSPHORUS: Phosphorus: 1.8 mg/dL — ABNORMAL LOW (ref 2.5–4.6)

## 2024-02-25 MED ORDER — BUDESONIDE 0.25 MG/2ML IN SUSP
0.2500 mg | Freq: Every day | RESPIRATORY_TRACT | 0 refills | Status: AC
  Filled 2024-02-25: qty 60, 30d supply, fill #0

## 2024-02-25 MED ORDER — OMEPRAZOLE MAGNESIUM 20 MG PO TBEC: 20.0000 mg | DELAYED_RELEASE_TABLET | Freq: Every day | ORAL | 0 refills | Status: AC

## 2024-02-25 MED ORDER — VITAMIN D (ERGOCALCIFEROL) 1.25 MG (50000 UNIT) PO CAPS
50000.0000 [IU] | ORAL_CAPSULE | ORAL | 0 refills | Status: AC
  Filled 2024-02-25: qty 12, 84d supply, fill #0

## 2024-02-25 MED ORDER — GUAIFENESIN ER 600 MG PO TB12
600.0000 mg | ORAL_TABLET | Freq: Two times a day (BID) | ORAL | Status: DC
  Administered 2024-02-25: 600 mg via ORAL
  Filled 2024-02-25: qty 1

## 2024-02-25 MED ORDER — GUAIFENESIN ER 600 MG PO TB12
600.0000 mg | ORAL_TABLET | Freq: Two times a day (BID) | ORAL | 0 refills | Status: AC
  Filled 2024-02-25: qty 10, 5d supply, fill #0

## 2024-02-25 MED ORDER — LEVETIRACETAM 500 MG PO TABS
1000.0000 mg | ORAL_TABLET | Freq: Every day | ORAL | Status: DC
  Administered 2024-02-25: 1000 mg via ORAL
  Filled 2024-02-25: qty 2

## 2024-02-25 MED ORDER — AZITHROMYCIN 500 MG PO TABS
500.0000 mg | ORAL_TABLET | Freq: Every day | ORAL | 0 refills | Status: AC
Start: 2024-02-25 — End: 2024-02-28
  Filled 2024-02-25: qty 3, 3d supply, fill #0

## 2024-02-25 MED ORDER — ALBUTEROL SULFATE (2.5 MG/3ML) 0.083% IN NEBU
2.5000 mg | INHALATION_SOLUTION | RESPIRATORY_TRACT | 2 refills | Status: AC | PRN
  Filled 2024-02-25: qty 75, 5d supply, fill #0
  Filled 2024-03-21: qty 75, 5d supply, fill #1

## 2024-02-25 MED ORDER — ACETAMINOPHEN 325 MG PO TABS
650.0000 mg | ORAL_TABLET | Freq: Four times a day (QID) | ORAL | Status: DC | PRN
  Administered 2024-02-25: 650 mg via ORAL
  Filled 2024-02-25: qty 2

## 2024-02-25 MED ORDER — PREDNISONE 20 MG PO TABS
40.0000 mg | ORAL_TABLET | Freq: Every day | ORAL | 0 refills | Status: AC
  Filled 2024-02-25: qty 6, 3d supply, fill #0

## 2024-02-25 MED ORDER — DEXTROSE 5 % IV SOLN
30.0000 mmol | Freq: Once | INTRAVENOUS | Status: AC
  Administered 2024-02-25: 30 mmol via INTRAVENOUS
  Filled 2024-02-25: qty 10

## 2024-02-25 NOTE — Evaluation (Signed)
 Physical Therapy Evaluation Patient Details Name: Shane Leach MRN: [id number] DOB: Sep 16, 1985 Today's Date: 02/25/2024  History of Present Illness  38 y.o. male with medical history significant of asthma, history of pulmonary embolism not on anticoagulation, morbid obesity, seizure disorder, tobacco abuse, history of testicular cancer since childhood who presented to the ER with significant shortness of breath, wheezing.  Clinical Impression  Patient received in bed, he is agreeable to PT/OT assessment. Patient is independent with bed mobility, transfers and ambulation. O2 sats at 97% while ambulating on room air. He does not require skilled PT at this time and is safe to go home. Signing off.         If plan is discharge home, recommend the following:     Can travel by private vehicle    yes    Equipment Recommendations None recommended by PT  Recommendations for Other Services       Functional Status Assessment Patient has not had a recent decline in their functional status     Precautions / Restrictions Precautions Precautions: None Recall of Precautions/Restrictions: Intact Restrictions Weight Bearing Restrictions Per Provider Order: No      Mobility  Bed Mobility Overal bed mobility: Independent                  Transfers Overall transfer level: Independent Equipment used: None                    Ambulation/Gait Ambulation/Gait assistance: Independent Gait Distance (Feet): 250 Feet Assistive device: None Gait Pattern/deviations: Step-through pattern, WFL(Within Functional Limits) Gait velocity: slightly decreased     General Gait Details: ambulated without AD  Stairs            Wheelchair Mobility     Tilt Bed    Modified Rankin (Stroke Patients Only)       Balance Overall balance assessment: Independent                                           Pertinent Vitals/Pain Pain Assessment Pain  Assessment: No/denies pain    Home Living Family/patient expects to be discharged to:: Private residence Living Arrangements: Spouse/significant other Available Help at Discharge: Family Type of Home: House           Home Equipment: None      Prior Function Prior Level of Function : Independent/Modified Independent;Driving;Working/employed                     Extremity/Trunk Assessment   Upper Extremity Assessment Upper Extremity Assessment: Overall WFL for tasks assessed    Lower Extremity Assessment Lower Extremity Assessment: Overall WFL for tasks assessed    Cervical / Trunk Assessment Cervical / Trunk Assessment: Normal  Communication   Communication Communication: No apparent difficulties    Cognition Arousal: Alert Behavior During Therapy: WFL for tasks assessed/performed   PT - Cognitive impairments: No apparent impairments                         Following commands: Intact       Cueing Cueing Techniques: Verbal cues     General Comments      Exercises     Assessment/Plan    PT Assessment Patient does not need any further PT services  PT Problem List  PT Treatment Interventions      PT Goals (Current goals can be found in the Care Plan section)  Acute Rehab PT Goals Patient Stated Goal: return home and back to work PT Goal Formulation: With patient Time For Goal Achievement: 02/28/24 Potential to Achieve Goals: Good    Frequency       Co-evaluation PT/OT/SLP Co-Evaluation/Treatment: Yes Reason for Co-Treatment: For patient/therapist safety;To address functional/ADL transfers PT goals addressed during session: Mobility/safety with mobility         AM-PAC PT 6 Clicks Mobility  Outcome Measure Help needed turning from your back to your side while in a flat bed without using bedrails?: None Help needed moving from lying on your back to sitting on the side of a flat bed without using bedrails?:  None Help needed moving to and from a bed to a chair (including a wheelchair)?: None Help needed standing up from a chair using your arms (e.g., wheelchair or bedside chair)?: None Help needed to walk in hospital room?: None Help needed climbing 3-5 steps with a railing? : None 6 Click Score: 24    End of Session   Activity Tolerance: Patient tolerated treatment well Patient left: in bed Nurse Communication: Mobility status      Time: 9069-9061 PT Time Calculation (min) (ACUTE ONLY): 8 min   Charges:   PT Evaluation $PT Eval Low Complexity: 1 Low   PT General Charges $$ ACUTE PT VISIT: 1 Visit         Ayannah Faddis, PT, GCS 02/25/24,10:24 AM

## 2024-02-25 NOTE — Evaluation (Signed)
 Occupational Therapy Evaluation Patient Details Name: Shane Leach MRN: [id number] DOB: December 06, 1985 Today's Date: 02/25/2024   History of Present Illness   38 y.o. male with medical history significant of asthma, history of pulmonary embolism not on anticoagulation, morbid obesity, seizure disorder, tobacco abuse, history of testicular cancer since childhood who presented to the ER with significant shortness of breath, wheezing.     Clinical Impressions Pt was seen for OT evaluation this date. Prior to hospital admission, pt was independent and working. Pt lives with his spouse and daughter. Pt presents with deficits in activity tolerance but generally able to complete all aspects of mobility and ADL independently. Pt educated in activity pacing to minimize risk of over exertion/SOB. Pt verbalized understanding. VSS throughout. Do not anticipate the need for follow up OT services upon acute hospital DC. Will sign off.      Functional Status Assessment   Patient has not had a recent decline in their functional status     Equipment Recommendations   None recommended by OT      Precautions/Restrictions   Precautions Precautions: None Restrictions Weight Bearing Restrictions Per Provider Order: No     Mobility Bed Mobility Overal bed mobility: Independent                  Transfers Overall transfer level: Independent                        Balance Overall balance assessment: Independent                                         ADL either performed or assessed with clinical judgement   ADL Overall ADL's : Independent                                             Vision         Perception         Praxis         Pertinent Vitals/Pain Pain Assessment Pain Assessment: No/denies pain     Extremity/Trunk Assessment Upper Extremity Assessment Upper Extremity Assessment: Overall WFL for tasks  assessed   Lower Extremity Assessment Lower Extremity Assessment: Overall WFL for tasks assessed   Cervical / Trunk Assessment Cervical / Trunk Assessment: Normal   Communication     Cognition Arousal: Alert Behavior During Therapy: WFL for tasks assessed/performed Cognition: No apparent impairments                                       Cueing  General Comments          Exercises Other Exercises Other Exercises: Pt educated in benefits of pulse oximeter, activity pacing to minimize over exertion   Shoulder Instructions      Home Living Family/patient expects to be discharged to:: Private residence Living Arrangements: Spouse/significant other;Children Available Help at Discharge: Family Type of Home: House                                  Prior Functioning/Environment Prior Level of Function : Independent/Modified Independent;Working/employed;Driving  OT Problem List: Decreased activity tolerance   OT Treatment/Interventions:        OT Goals(Current goals can be found in the care plan section)   Acute Rehab OT Goals Patient Stated Goal: go home OT Goal Formulation: All assessment and education complete, DC therapy   OT Frequency:       Co-evaluation              AM-PAC OT 6 Clicks Daily Activity     Outcome Measure Help from another person eating meals?: None Help from another person taking care of personal grooming?: None Help from another person toileting, which includes using toliet, bedpan, or urinal?: None Help from another person bathing (including washing, rinsing, drying)?: None Help from another person to put on and taking off regular upper body clothing?: None Help from another person to put on and taking off regular lower body clothing?: None 6 Click Score: 24   End of Session Nurse Communication: Mobility status  Activity Tolerance: Patient tolerated treatment well Patient  left: in bed;with call bell/phone within reach  OT Visit Diagnosis: Other abnormalities of gait and mobility (R26.89)                Time: 9069-9061 OT Time Calculation (min): 8 min Charges:  OT General Charges $OT Visit: 1 Visit OT Evaluation $OT Eval Low Complexity: 1 Low  Warren SAUNDERS., MPH, MS, OTR/L ascom [phone] 02/25/24, 10:15 AM

## 2024-02-25 NOTE — ED Notes (Signed)
 Pt up to commode, nadn, pt able to ambulate without assistance

## 2024-02-25 NOTE — Plan of Care (Incomplete)
  Pending RSV report

## 2024-02-25 NOTE — ED Notes (Signed)
 RN Olam made aware that potassium phosphate  bag is finished.

## 2024-02-25 NOTE — Discharge Summary (Signed)
 [facility] Discharge Summary   Patient: Shane Leach  PCP: Pcp, No  Date of admission: 02/24/2024   Date of discharge:  02/25/2024     Discharge Diagnoses:  Principal Problem:   Asthma attack Active Problems:   hx of Pulmonary embolism on right (HCC)   Alcohol dependence (HCC)   History of seizure   Tobacco abuse   Admitted From: Home Disposition:  Home   Recommendations for Outpatient Follow-up:  PCP: in 1 wk Follow up LABS/TEST:  Vit D level after 3-6 months    Follow-up Information     PCP Follow up in 1 week(s).                 Diet recommendation: low fat diet  Activity: The patient is advised to gradually reintroduce usual activities, as tolerated  Discharge Condition: stable  Code Status: Full code   History of present illness: As per the H and P dictated on admission  Hospital Course:  Shane Leach is a 38 y.o. male with medical history significant of asthma, history of pulmonary embolism not on anticoagulation, morbid obesity, seizure disorder, tobacco abuse, history of testicular cancer since childhood who presented to the ER with significant shortness of breath, wheezing.  Patient has had chronic symptoms of asthma.  He also continues to smoke about a pack per day.  Was never officially diagnosed with COPD.  Patient was treated with initial steroids and breathing treatment in the ER but symptoms persist.  Continue to have cough but no fever.   Assessment and Plan:   # Acute exacerbation of asthma:  S/p IV Solu-Medrol , transition to prednisone .  Discharged on prednisone  40 mg p.o. daily.  Azithromycin  5 mg p.o. daily for 3 days for possible bronchitis.  Mucinex  600 mg p.o. twice daily for 5 days. Continue albuterol  budesonide  nebulizer.  Continued Symbicort  Patient was advised to follow with PCP in 1 week.   # history of seizure disorder: Patient was supposed to be on Keppra .  Not sure if he is taking medications.   Patient may resume Keppra  if he agrees to take it.   # history of alcohol dependence: Patient has quit alcohol but still smokes.  Counseling done # Tobacco abuse: Patient smokes cigarettes 1 pack/day.  Smoking cessation counseling done   # elevated blood pressure without diagnosis of hypertension:  Blood pressure improved, but is fluctuating.  No need of medications at this time, patient was advised to monitor BP at home and follow with PCP.  # history of pulmonary embolism: Not on anticoagulation.  Recommend to follow with PCP.   # Vitamin D  insufficiency: started vitamin D  50,000 units p.o. weekly, follow with PCP to repeat vitamin D  level after 3 to 6 months.  # Hypophosphatemia: Phos repleted IV for discharge.  Body mass index is 35.87 kg/m.  Nutrition Interventions:  - Patient was instructed, not to drive, operate heavy machinery, perform activities at heights, swimming or participation in water activities or provide baby sitting services while on Pain, Sleep and Anxiety Medications; until his outpatient Physician has advised to do so again.  - Also recommended to not to take more than prescribed Pain, Sleep and Anxiety Medications.  Patient was ambulatory without any assistance. On the day of the discharge the patient's vitals were stable, and no other acute medical condition were reported by patient. the patient was felt safe to be discharge at Home.  9/17 patient is still wheezing significantly but remains on room air.  Patient wanted to be discharged home so patient was discharged on number medications and recommended to follow with PCP.  Patient remains at high risk for readmission if he continues to smoke and noncompliant medications.   Consultants: None Procedures: None  Discharge Exam: General: Appear in no distress, no Rash; Oral Mucosa Clear, moist. Cardiovascular: S1 and S2 Present, no Murmur, Respiratory: normal respiratory effort, Bilateral Air entry present and  no Crackles, but positive significant wheezes b/l Abdomen: BS present, Soft and no tenderness, no hernia Extremities: no Pedal edema, no calf tenderness Neurology: alert and oriented to time, place, and person affect appropriate.  Filed Weights   02/24/24 1719  Weight: 113.4 kg   Vitals:   02/25/24 1230 02/25/24 1345  BP:    Pulse: 96 89  Resp: 17 15  Temp:    SpO2: 97% 98%    DISCHARGE MEDICATION: Allergies as of 02/25/2024       Reactions   Dilantin  [phenytoin  Sodium Extended] Other (See Comments)   Makes patient feel loopy   Penicillins Other (See Comments)   Has patient had a PCN reaction causing immediate rash, facial/tongue/throat swelling, SOB or lightheadedness with hypotension: Unknown Has patient had a PCN reaction causing severe rash involving mucus membranes or skin necrosis: Unknown Has patient had a PCN reaction that required hospitalization: Unknown Has patient had a PCN reaction occurring within the last 10 years: Unknown If all of the above answers are NO, then may proceed with Cephalosporin use.   Phenytoin  Swelling   Causes more seizures, insomnia, headache   Tegretol [carbamazepine] Other (See Comments)   Iodinated Contrast Media Itching        Medication List     TAKE these medications    acetaminophen  325 MG tablet Commonly known as: TYLENOL  Take 650 mg by mouth every 6 (six) hours as needed.   albuterol  (2.5 MG/3ML) 0.083% nebulizer solution Commonly known as: PROVENTIL  Take 3 mLs (2.5 mg total) by nebulization every 4 (four) hours as needed for wheezing or shortness of breath.   azithromycin  500 MG tablet Commonly known as: Zithromax  Take 1 tablet (500 mg total) by mouth daily for 3 days.   budesonide  0.25 MG/2ML nebulizer solution Commonly known as: Pulmicort  Take 2 mLs (0.25 mg total) by nebulization daily.   guaiFENesin  600 MG 12 hr tablet Commonly known as: MUCINEX  Take 1 tablet (600 mg total) by mouth 2 (two) times daily  for 5 days.   levETIRAcetam  1000 MG tablet Commonly known as: KEPPRA  TAKE ONE TABLET BY MOUTH 2 TIMES A DAY   omeprazole  20 MG tablet Commonly known as: PriLOSEC  OTC Take 1 tablet (20 mg total) by mouth daily for 5 days.   predniSONE  20 MG tablet Commonly known as: DELTASONE  Take 2 tablets (40 mg total) by mouth daily with breakfast for 3 days. Start taking on: February 26, 2024   Symbicort  160-4.5 MCG/ACT inhaler Generic drug: budesonide -formoterol  INHALE 2 PUFFS INTO THE LUNGS ONCE EVERY 12 HOURS.   Vitamin D  (Ergocalciferol ) 1.25 MG (50000 UNIT) Caps capsule Commonly known as: DRISDOL  Take 1 capsule (50,000 Units total) by mouth every 7 (seven) days.       Allergies  Allergen Reactions   Dilantin  [Phenytoin  Sodium Extended] Other (See Comments)    Makes patient feel loopy   Penicillins Other (See Comments)    Has patient had a PCN reaction causing immediate rash, facial/tongue/throat swelling, SOB or lightheadedness with hypotension: Unknown Has patient had a PCN reaction causing severe rash involving mucus  membranes or skin necrosis: Unknown Has patient had a PCN reaction that required hospitalization: Unknown Has patient had a PCN reaction occurring within the last 10 years: Unknown If all of the above answers are NO, then may proceed with Cephalosporin use.   Phenytoin  Swelling    Causes more seizures, insomnia, headache   Tegretol [Carbamazepine] Other (See Comments)   Iodinated Contrast Media Itching   Discharge Instructions     Call MD for:  difficulty breathing, headache or visual disturbances   Complete by: As directed    Call MD for:  extreme fatigue   Complete by: As directed    Call MD for:  persistant dizziness or light-headedness   Complete by: As directed    Call MD for:  persistant nausea and vomiting   Complete by: As directed    Call MD for:  severe uncontrolled pain   Complete by: As directed    Call MD for:  temperature >100.4   Complete  by: As directed    Discharge instructions   Complete by: As directed    Follow-up with the PCP in 1 week   Increase activity slowly   Complete by: As directed        The results of significant diagnostics from this hospitalization (including imaging, microbiology, ancillary and laboratory) are listed below for reference.    Significant Diagnostic Studies: DG Chest 2 View Result Date: 02/24/2024 CLINICAL DATA:  sob EXAM: CHEST - 2 VIEW COMPARISON:  May 21 25 FINDINGS: No focal airspace consolidation, pleural effusion, or pneumothorax. No cardiomegaly.No acute fracture or destructive lesion. IMPRESSION: No acute cardiopulmonary abnormality. Electronically Signed   By: Rogelia Myers M.D.   On: 02/24/2024 17:43    Microbiology: Recent Results (from the past 240 hours)  Resp panel by RT-PCR (RSV, Flu A&B, Covid) Anterior Nasal Swab     Status: None   Collection Time: 02/24/24  5:25 PM   Specimen: Anterior Nasal Swab  Result Value Ref Range Status   SARS Coronavirus 2 by RT PCR NEGATIVE NEGATIVE Final    Comment: (NOTE) SARS-CoV-2 target nucleic acids are NOT DETECTED.  The SARS-CoV-2 RNA is generally detectable in upper respiratory specimens during the acute phase of infection. The lowest concentration of SARS-CoV-2 viral copies this assay can detect is 138 copies/mL. A negative result does not preclude SARS-Cov-2 infection and should not be used as the sole basis for treatment or other patient management decisions. A negative result may occur with  improper specimen collection/handling, submission of specimen other than nasopharyngeal swab, presence of viral mutation(s) within the areas targeted by this assay, and inadequate number of viral copies(<138 copies/mL). A negative result must be combined with clinical observations, patient history, and epidemiological information. The expected result is Negative.  Fact Sheet for Patients:   BloggerCourse.com  Fact Sheet for Healthcare Providers:  SeriousBroker.it  This test is no t yet approved or cleared by the [place]  FDA and  has been authorized for detection and/or diagnosis of SARS-CoV-2 by FDA under an Emergency Use Authorization (EUA). This EUA will remain  in effect (meaning this test can be used) for the duration of the COVID-19 declaration under Section 564(b)(1) of the Act, 21 U.S.C.section 360bbb-3(b)(1), unless the authorization is terminated  or revoked sooner.       Influenza A by PCR NEGATIVE NEGATIVE Final   Influenza B by PCR NEGATIVE NEGATIVE Final    Comment: (NOTE) The Xpert Xpress SARS-CoV-2/FLU/RSV plus assay is intended as an aid in  the diagnosis of influenza from Nasopharyngeal swab specimens and should not be used as a sole basis for treatment. Nasal washings and aspirates are unacceptable for Xpert Xpress SARS-CoV-2/FLU/RSV testing.  Fact Sheet for Patients: BloggerCourse.com  Fact Sheet for Healthcare Providers: SeriousBroker.it  This test is not yet approved or cleared by the [place]  FDA and has been authorized for detection and/or diagnosis of SARS-CoV-2 by FDA under an Emergency Use Authorization (EUA). This EUA will remain in effect (meaning this test can be used) for the duration of the COVID-19 declaration under Section 564(b)(1) of the Act, 21 U.S.C. section 360bbb-3(b)(1), unless the authorization is terminated or revoked.     Resp Syncytial Virus by PCR NEGATIVE NEGATIVE Final    Comment: (NOTE) Fact Sheet for Patients: BloggerCourse.com  Fact Sheet for Healthcare Providers: SeriousBroker.it  This test is not yet approved or cleared by the [place]  FDA and has been authorized for detection and/or diagnosis of SARS-CoV-2 by FDA under an Emergency Use  Authorization (EUA). This EUA will remain in effect (meaning this test can be used) for the duration of the COVID-19 declaration under Section 564(b)(1) of the Act, 21 U.S.C. section 360bbb-3(b)(1), unless the authorization is terminated or revoked.  Performed at [facility], [address] [place], [place] [address]      Labs: CBC: Recent Labs  Lab 02/24/24 1837 02/24/24 2313 02/25/24 0556  WBC 7.6 10.8* 6.8  NEUTROABS 5.7  --   --   HGB 16.4 15.4 15.7  HCT 48.8 44.6 47.5  MCV 90.0 90.1 90.3  PLT 275 246 288   Basic Metabolic Panel: Recent Labs  Lab 02/24/24 1837 02/24/24 2313 02/25/24 0556  NA 138  --  138  K 4.2  --  4.1  CL 103  --  104  CO2 25  --  22  GLUCOSE 102*  --  188*  BUN 10  --  12  CREATININE 1.23 1.20 1.14  CALCIUM 9.6  --  9.3  MG  --   --  2.4  PHOS  --   --  1.8*   Liver Function Tests: Recent Labs  Lab 02/24/24 1837 02/25/24 0556  AST 29 45*  ALT 33 38  ALKPHOS 63 55  BILITOT 0.9 0.7  PROT 8.1 8.0  ALBUMIN 4.6 4.2   No results for input(s): LIPASE, AMYLASE in the last 168 hours. No results for input(s): AMMONIA in the last 168 hours. Cardiac Enzymes: No results for input(s): CKTOTAL, CKMB, CKMBINDEX, TROPONINI in the last 168 hours. BNP (last 3 results) No results for input(s): BNP in the last 8760 hours. CBG: No results for input(s): GLUCAP in the last 168 hours.  Time spent: 35 minutes  Signed:  Elvan Sor  [facility] 02/25/2024 3:25 PM

## 2024-02-26 LAB — RESPIRATORY PANEL BY PCR

## 2024-03-21 ENCOUNTER — Other Ambulatory Visit: Payer: Self-pay

## 2024-03-22 ENCOUNTER — Other Ambulatory Visit: Payer: Self-pay
